# Patient Record
Sex: Female | Born: 1940 | Race: White | Hispanic: No | Smoking: Never smoker
Health system: Southern US, Community
[De-identification: ages and names within clinical notes are randomized; demographics above are authoritative.]

## PROBLEM LIST (undated history)

## (undated) DIAGNOSIS — K2100 Gastro-esophageal reflux disease with esophagitis, without bleeding: Secondary | ICD-10-CM

## (undated) DIAGNOSIS — R0600 Dyspnea, unspecified: Secondary | ICD-10-CM

## (undated) DIAGNOSIS — I499 Cardiac arrhythmia, unspecified: Secondary | ICD-10-CM

## (undated) DIAGNOSIS — I340 Nonrheumatic mitral (valve) insufficiency: Secondary | ICD-10-CM

## (undated) DIAGNOSIS — M436 Torticollis: Secondary | ICD-10-CM

## (undated) DIAGNOSIS — J45909 Unspecified asthma, uncomplicated: Secondary | ICD-10-CM

## (undated) DIAGNOSIS — E785 Hyperlipidemia, unspecified: Secondary | ICD-10-CM

## (undated) DIAGNOSIS — I471 Supraventricular tachycardia, unspecified: Secondary | ICD-10-CM

## (undated) DIAGNOSIS — T4145XA Adverse effect of unspecified anesthetic, initial encounter: Secondary | ICD-10-CM

## (undated) DIAGNOSIS — H3530 Unspecified macular degeneration: Secondary | ICD-10-CM

## (undated) DIAGNOSIS — T8859XA Other complications of anesthesia, initial encounter: Secondary | ICD-10-CM

## (undated) DIAGNOSIS — K2270 Barrett's esophagus without dysplasia: Secondary | ICD-10-CM

## (undated) DIAGNOSIS — N6019 Diffuse cystic mastopathy of unspecified breast: Secondary | ICD-10-CM

## (undated) DIAGNOSIS — Z9109 Other allergy status, other than to drugs and biological substances: Secondary | ICD-10-CM

## (undated) DIAGNOSIS — H409 Unspecified glaucoma: Secondary | ICD-10-CM

## (undated) DIAGNOSIS — I2510 Atherosclerotic heart disease of native coronary artery without angina pectoris: Secondary | ICD-10-CM

## (undated) DIAGNOSIS — J329 Chronic sinusitis, unspecified: Secondary | ICD-10-CM

## (undated) DIAGNOSIS — R112 Nausea with vomiting, unspecified: Secondary | ICD-10-CM

## (undated) DIAGNOSIS — G5790 Unspecified mononeuropathy of unspecified lower limb: Secondary | ICD-10-CM

## (undated) DIAGNOSIS — Z9889 Other specified postprocedural states: Secondary | ICD-10-CM

## (undated) DIAGNOSIS — K5790 Diverticulosis of intestine, part unspecified, without perforation or abscess without bleeding: Secondary | ICD-10-CM

## (undated) DIAGNOSIS — K219 Gastro-esophageal reflux disease without esophagitis: Secondary | ICD-10-CM

## (undated) DIAGNOSIS — J302 Other seasonal allergic rhinitis: Secondary | ICD-10-CM

## (undated) DIAGNOSIS — M1990 Unspecified osteoarthritis, unspecified site: Secondary | ICD-10-CM

## (undated) DIAGNOSIS — I10 Essential (primary) hypertension: Secondary | ICD-10-CM

## (undated) HISTORY — PX: KNEE ARTHROSCOPY: SUR90

## (undated) HISTORY — PX: ABDOMINAL HYSTERECTOMY: SHX81

## (undated) HISTORY — PX: OTHER SURGICAL HISTORY: SHX169

---

## 1978-10-31 HISTORY — PX: BREAST EXCISIONAL BIOPSY: SUR124

## 1978-10-31 HISTORY — PX: BREAST BIOPSY: SHX20

## 2005-02-04 ENCOUNTER — Ambulatory Visit: Payer: Self-pay

## 2005-08-12 ENCOUNTER — Ambulatory Visit: Payer: Self-pay | Admitting: Internal Medicine

## 2006-05-23 ENCOUNTER — Ambulatory Visit: Payer: Self-pay

## 2006-08-15 ENCOUNTER — Ambulatory Visit: Payer: Self-pay | Admitting: Internal Medicine

## 2007-08-17 ENCOUNTER — Ambulatory Visit: Payer: Self-pay | Admitting: Internal Medicine

## 2007-09-07 ENCOUNTER — Other Ambulatory Visit: Payer: Self-pay

## 2007-09-07 ENCOUNTER — Ambulatory Visit: Payer: Self-pay | Admitting: Unknown Physician Specialty

## 2007-09-12 ENCOUNTER — Inpatient Hospital Stay: Payer: Self-pay | Admitting: Unknown Physician Specialty

## 2007-10-08 ENCOUNTER — Ambulatory Visit: Payer: Self-pay | Admitting: Cardiology

## 2007-11-01 HISTORY — PX: JOINT REPLACEMENT: SHX530

## 2008-08-18 ENCOUNTER — Ambulatory Visit: Payer: Self-pay | Admitting: Internal Medicine

## 2009-08-21 ENCOUNTER — Ambulatory Visit: Payer: Self-pay | Admitting: Internal Medicine

## 2010-08-25 ENCOUNTER — Ambulatory Visit: Payer: Self-pay | Admitting: Internal Medicine

## 2011-09-20 ENCOUNTER — Ambulatory Visit: Payer: Self-pay | Admitting: Internal Medicine

## 2012-09-14 ENCOUNTER — Encounter: Payer: Self-pay | Admitting: Rheumatology

## 2012-09-20 ENCOUNTER — Ambulatory Visit: Payer: Self-pay | Admitting: Internal Medicine

## 2013-08-23 ENCOUNTER — Ambulatory Visit: Payer: Self-pay | Admitting: Internal Medicine

## 2013-09-23 ENCOUNTER — Ambulatory Visit: Payer: Self-pay | Admitting: Internal Medicine

## 2014-01-28 ENCOUNTER — Ambulatory Visit: Payer: Self-pay | Admitting: Internal Medicine

## 2014-09-24 ENCOUNTER — Ambulatory Visit: Payer: Self-pay | Admitting: Internal Medicine

## 2014-10-31 ENCOUNTER — Emergency Department: Payer: Self-pay | Admitting: Emergency Medicine

## 2014-10-31 DIAGNOSIS — K639 Disease of intestine, unspecified: Secondary | ICD-10-CM | POA: Diagnosis not present

## 2014-10-31 DIAGNOSIS — Z880 Allergy status to penicillin: Secondary | ICD-10-CM | POA: Diagnosis not present

## 2014-10-31 DIAGNOSIS — R197 Diarrhea, unspecified: Secondary | ICD-10-CM | POA: Diagnosis not present

## 2014-10-31 DIAGNOSIS — R9431 Abnormal electrocardiogram [ECG] [EKG]: Secondary | ICD-10-CM | POA: Diagnosis not present

## 2014-10-31 DIAGNOSIS — R112 Nausea with vomiting, unspecified: Secondary | ICD-10-CM | POA: Diagnosis not present

## 2014-10-31 DIAGNOSIS — K529 Noninfective gastroenteritis and colitis, unspecified: Secondary | ICD-10-CM | POA: Diagnosis not present

## 2014-10-31 LAB — URINALYSIS, COMPLETE
Bilirubin,UR: NEGATIVE
GLUCOSE, UR: NEGATIVE mg/dL (ref 0–75)
Ketone: NEGATIVE
NITRITE: NEGATIVE
Ph: 5 (ref 4.5–8.0)
Protein: 30
Specific Gravity: 1.041 (ref 1.003–1.030)
Squamous Epithelial: 1
WBC UR: 20 /HPF (ref 0–5)

## 2014-10-31 LAB — CBC WITH DIFFERENTIAL/PLATELET
Basophil #: 0.1 10*3/uL (ref 0.0–0.1)
Basophil %: 0.4 %
EOS ABS: 0.1 10*3/uL (ref 0.0–0.7)
EOS PCT: 0.3 %
HCT: 44.3 % (ref 35.0–47.0)
HGB: 14.3 g/dL (ref 12.0–16.0)
Lymphocyte #: 0.5 10*3/uL — ABNORMAL LOW (ref 1.0–3.6)
Lymphocyte %: 3 %
MCH: 29.7 pg (ref 26.0–34.0)
MCHC: 32.4 g/dL (ref 32.0–36.0)
MCV: 92 fL (ref 80–100)
MONO ABS: 1.5 x10 3/mm — AB (ref 0.2–0.9)
Monocyte %: 7.9 %
Neutrophil #: 16.3 10*3/uL — ABNORMAL HIGH (ref 1.4–6.5)
Neutrophil %: 88.4 %
PLATELETS: 356 10*3/uL (ref 150–440)
RBC: 4.83 10*6/uL (ref 3.80–5.20)
RDW: 13.6 % (ref 11.5–14.5)
WBC: 18.5 10*3/uL — ABNORMAL HIGH (ref 3.6–11.0)

## 2014-10-31 LAB — COMPREHENSIVE METABOLIC PANEL
ALBUMIN: 4 g/dL (ref 3.4–5.0)
ALK PHOS: 374 U/L — AB
ALT: 45 U/L
Anion Gap: 9 (ref 7–16)
BUN: 26 mg/dL — ABNORMAL HIGH (ref 7–18)
Bilirubin,Total: 0.5 mg/dL (ref 0.2–1.0)
CREATININE: 1.16 mg/dL (ref 0.60–1.30)
Calcium, Total: 8.7 mg/dL (ref 8.5–10.1)
Chloride: 103 mmol/L (ref 98–107)
Co2: 24 mmol/L (ref 21–32)
EGFR (African American): 59 — ABNORMAL LOW
EGFR (Non-African Amer.): 49 — ABNORMAL LOW
GLUCOSE: 189 mg/dL — AB (ref 65–99)
OSMOLALITY: 282 (ref 275–301)
Potassium: 4.2 mmol/L (ref 3.5–5.1)
SGOT(AST): 43 U/L — ABNORMAL HIGH (ref 15–37)
Sodium: 136 mmol/L (ref 136–145)
Total Protein: 7.2 g/dL (ref 6.4–8.2)

## 2014-10-31 LAB — HEMOGLOBIN: HGB: 13.4 g/dL (ref 12.0–16.0)

## 2014-10-31 LAB — TROPONIN I: Troponin-I: <0.02 ng/mL

## 2014-10-31 LAB — LIPASE, BLOOD: Lipase: 122 U/L (ref 73–393)

## 2014-11-12 DIAGNOSIS — R531 Weakness: Secondary | ICD-10-CM | POA: Diagnosis not present

## 2014-11-12 DIAGNOSIS — K529 Noninfective gastroenteritis and colitis, unspecified: Secondary | ICD-10-CM | POA: Diagnosis not present

## 2014-11-12 DIAGNOSIS — J40 Bronchitis, not specified as acute or chronic: Secondary | ICD-10-CM | POA: Diagnosis not present

## 2014-11-27 DIAGNOSIS — H6121 Impacted cerumen, right ear: Secondary | ICD-10-CM | POA: Diagnosis not present

## 2015-01-30 DIAGNOSIS — R6884 Jaw pain: Secondary | ICD-10-CM | POA: Diagnosis not present

## 2015-01-30 DIAGNOSIS — M542 Cervicalgia: Secondary | ICD-10-CM | POA: Diagnosis not present

## 2015-02-02 DIAGNOSIS — M47812 Spondylosis without myelopathy or radiculopathy, cervical region: Secondary | ICD-10-CM | POA: Diagnosis not present

## 2015-02-02 DIAGNOSIS — M5032 Other cervical disc degeneration, mid-cervical region: Secondary | ICD-10-CM | POA: Diagnosis not present

## 2015-02-13 DIAGNOSIS — I10 Essential (primary) hypertension: Secondary | ICD-10-CM | POA: Diagnosis not present

## 2015-02-13 DIAGNOSIS — E785 Hyperlipidemia, unspecified: Secondary | ICD-10-CM | POA: Diagnosis not present

## 2015-02-13 DIAGNOSIS — R5383 Other fatigue: Secondary | ICD-10-CM | POA: Diagnosis not present

## 2015-02-13 DIAGNOSIS — Z79899 Other long term (current) drug therapy: Secondary | ICD-10-CM | POA: Diagnosis not present

## 2015-02-20 DIAGNOSIS — I10 Essential (primary) hypertension: Secondary | ICD-10-CM | POA: Diagnosis not present

## 2015-02-20 DIAGNOSIS — M1990 Unspecified osteoarthritis, unspecified site: Secondary | ICD-10-CM | POA: Diagnosis not present

## 2015-02-20 DIAGNOSIS — M436 Torticollis: Secondary | ICD-10-CM | POA: Diagnosis not present

## 2015-02-20 DIAGNOSIS — I471 Supraventricular tachycardia: Secondary | ICD-10-CM | POA: Diagnosis not present

## 2015-05-20 ENCOUNTER — Other Ambulatory Visit: Payer: Self-pay | Admitting: Gastroenterology

## 2015-05-20 DIAGNOSIS — R12 Heartburn: Secondary | ICD-10-CM | POA: Diagnosis not present

## 2015-05-20 DIAGNOSIS — R1310 Dysphagia, unspecified: Secondary | ICD-10-CM

## 2015-05-20 DIAGNOSIS — Z1211 Encounter for screening for malignant neoplasm of colon: Secondary | ICD-10-CM | POA: Diagnosis not present

## 2015-05-26 ENCOUNTER — Ambulatory Visit
Admission: RE | Admit: 2015-05-26 | Discharge: 2015-05-26 | Disposition: A | Discharge status: Home / Self Care | Payer: Commercial Managed Care - HMO | Source: Ambulatory Visit | Attending: Gastroenterology | Admitting: Gastroenterology

## 2015-05-26 DIAGNOSIS — K219 Gastro-esophageal reflux disease without esophagitis: Secondary | ICD-10-CM | POA: Diagnosis not present

## 2015-05-26 DIAGNOSIS — R1310 Dysphagia, unspecified: Secondary | ICD-10-CM | POA: Diagnosis present

## 2015-05-26 DIAGNOSIS — I700 Atherosclerosis of aorta: Secondary | ICD-10-CM | POA: Diagnosis not present

## 2015-05-26 DIAGNOSIS — K210 Gastro-esophageal reflux disease with esophagitis: Secondary | ICD-10-CM | POA: Diagnosis not present

## 2015-05-26 DIAGNOSIS — K228 Other specified diseases of esophagus: Secondary | ICD-10-CM | POA: Insufficient documentation

## 2015-05-26 DIAGNOSIS — R12 Heartburn: Secondary | ICD-10-CM

## 2015-06-01 DIAGNOSIS — H2513 Age-related nuclear cataract, bilateral: Secondary | ICD-10-CM | POA: Diagnosis not present

## 2015-06-01 DIAGNOSIS — D3131 Benign neoplasm of right choroid: Secondary | ICD-10-CM | POA: Diagnosis not present

## 2015-06-23 ENCOUNTER — Encounter
Admission: RE | Disposition: A | Discharge status: Home / Self Care | Payer: Self-pay | Source: Ambulatory Visit | Attending: Gastroenterology

## 2015-06-23 ENCOUNTER — Ambulatory Visit
Admission: RE | Admit: 2015-06-23 | Discharge: 2015-06-23 | Disposition: A | Discharge status: Home / Self Care | Payer: Commercial Managed Care - HMO | Source: Ambulatory Visit | Attending: Gastroenterology | Admitting: Gastroenterology

## 2015-06-23 ENCOUNTER — Encounter: Payer: Self-pay | Admitting: *Deleted

## 2015-06-23 ENCOUNTER — Ambulatory Visit: Payer: Commercial Managed Care - HMO | Admitting: Anesthesiology

## 2015-06-23 DIAGNOSIS — K2210 Ulcer of esophagus without bleeding: Secondary | ICD-10-CM | POA: Diagnosis not present

## 2015-06-23 DIAGNOSIS — K219 Gastro-esophageal reflux disease without esophagitis: Secondary | ICD-10-CM | POA: Diagnosis not present

## 2015-06-23 DIAGNOSIS — I10 Essential (primary) hypertension: Secondary | ICD-10-CM | POA: Diagnosis not present

## 2015-06-23 DIAGNOSIS — Z882 Allergy status to sulfonamides status: Secondary | ICD-10-CM | POA: Insufficient documentation

## 2015-06-23 DIAGNOSIS — Z1211 Encounter for screening for malignant neoplasm of colon: Secondary | ICD-10-CM | POA: Diagnosis not present

## 2015-06-23 DIAGNOSIS — J45909 Unspecified asthma, uncomplicated: Secondary | ICD-10-CM | POA: Insufficient documentation

## 2015-06-23 DIAGNOSIS — Z7982 Long term (current) use of aspirin: Secondary | ICD-10-CM | POA: Diagnosis not present

## 2015-06-23 DIAGNOSIS — K2970 Gastritis, unspecified, without bleeding: Secondary | ICD-10-CM | POA: Diagnosis not present

## 2015-06-23 DIAGNOSIS — K224 Dyskinesia of esophagus: Secondary | ICD-10-CM | POA: Diagnosis not present

## 2015-06-23 DIAGNOSIS — I2510 Atherosclerotic heart disease of native coronary artery without angina pectoris: Secondary | ICD-10-CM | POA: Diagnosis not present

## 2015-06-23 DIAGNOSIS — B9681 Helicobacter pylori [H. pylori] as the cause of diseases classified elsewhere: Secondary | ICD-10-CM | POA: Diagnosis not present

## 2015-06-23 DIAGNOSIS — R1310 Dysphagia, unspecified: Secondary | ICD-10-CM | POA: Insufficient documentation

## 2015-06-23 DIAGNOSIS — Z880 Allergy status to penicillin: Secondary | ICD-10-CM | POA: Insufficient documentation

## 2015-06-23 DIAGNOSIS — M1990 Unspecified osteoarthritis, unspecified site: Secondary | ICD-10-CM | POA: Insufficient documentation

## 2015-06-23 DIAGNOSIS — Z538 Procedure and treatment not carried out for other reasons: Secondary | ICD-10-CM | POA: Diagnosis not present

## 2015-06-23 DIAGNOSIS — E785 Hyperlipidemia, unspecified: Secondary | ICD-10-CM | POA: Insufficient documentation

## 2015-06-23 DIAGNOSIS — Z79899 Other long term (current) drug therapy: Secondary | ICD-10-CM | POA: Insufficient documentation

## 2015-06-23 DIAGNOSIS — Z791 Long term (current) use of non-steroidal anti-inflammatories (NSAID): Secondary | ICD-10-CM | POA: Insufficient documentation

## 2015-06-23 DIAGNOSIS — K3189 Other diseases of stomach and duodenum: Secondary | ICD-10-CM | POA: Diagnosis not present

## 2015-06-23 DIAGNOSIS — K5730 Diverticulosis of large intestine without perforation or abscess without bleeding: Secondary | ICD-10-CM | POA: Insufficient documentation

## 2015-06-23 DIAGNOSIS — T7840XA Allergy, unspecified, initial encounter: Secondary | ICD-10-CM | POA: Insufficient documentation

## 2015-06-23 DIAGNOSIS — K210 Gastro-esophageal reflux disease with esophagitis: Secondary | ICD-10-CM | POA: Diagnosis not present

## 2015-06-23 DIAGNOSIS — K259 Gastric ulcer, unspecified as acute or chronic, without hemorrhage or perforation: Secondary | ICD-10-CM | POA: Diagnosis not present

## 2015-06-23 DIAGNOSIS — I472 Ventricular tachycardia: Secondary | ICD-10-CM | POA: Diagnosis not present

## 2015-06-23 DIAGNOSIS — K2950 Unspecified chronic gastritis without bleeding: Secondary | ICD-10-CM | POA: Insufficient documentation

## 2015-06-23 DIAGNOSIS — K2960 Other gastritis without bleeding: Secondary | ICD-10-CM | POA: Diagnosis not present

## 2015-06-23 HISTORY — PX: ESOPHAGOGASTRODUODENOSCOPY (EGD) WITH PROPOFOL: SHX5813

## 2015-06-23 HISTORY — DX: Torticollis: M43.6

## 2015-06-23 HISTORY — DX: Unspecified asthma, uncomplicated: J45.909

## 2015-06-23 HISTORY — DX: Diffuse cystic mastopathy of unspecified breast: N60.19

## 2015-06-23 HISTORY — PX: COLONOSCOPY WITH PROPOFOL: SHX5780

## 2015-06-23 HISTORY — DX: Atherosclerotic heart disease of native coronary artery without angina pectoris: I25.10

## 2015-06-23 HISTORY — DX: Essential (primary) hypertension: I10

## 2015-06-23 HISTORY — DX: Supraventricular tachycardia, unspecified: I47.10

## 2015-06-23 HISTORY — DX: Hyperlipidemia, unspecified: E78.5

## 2015-06-23 HISTORY — DX: Supraventricular tachycardia: I47.1

## 2015-06-23 HISTORY — DX: Chronic sinusitis, unspecified: J32.9

## 2015-06-23 HISTORY — DX: Unspecified osteoarthritis, unspecified site: M19.90

## 2015-06-23 HISTORY — DX: Other allergy status, other than to drugs and biological substances: Z91.09

## 2015-06-23 SURGERY — ESOPHAGOGASTRODUODENOSCOPY (EGD) WITH PROPOFOL
Anesthesia: General

## 2015-06-23 MED ORDER — SODIUM CHLORIDE 0.9 % IV SOLN
INTRAVENOUS | Status: DC
  Administered 2015-06-23: 16:00:00 via INTRAVENOUS

## 2015-06-23 MED ORDER — SODIUM CHLORIDE 0.9 % IV SOLN
INTRAVENOUS | Status: DC
  Administered 2015-06-23: 1000 mL via INTRAVENOUS

## 2015-06-23 MED ORDER — PROPOFOL INFUSION 10 MG/ML OPTIME
INTRAVENOUS | Status: DC | PRN
  Administered 2015-06-23: 120 ug/kg/min via INTRAVENOUS

## 2015-06-23 MED ORDER — GLYCOPYRROLATE 0.2 MG/ML IJ SOLN
INTRAMUSCULAR | Status: DC | PRN
  Administered 2015-06-23: 0.2 mg via INTRAVENOUS

## 2015-06-23 MED ORDER — MIDAZOLAM HCL 2 MG/2ML IJ SOLN
INTRAMUSCULAR | Status: DC | PRN
  Administered 2015-06-23: 1 mg via INTRAVENOUS

## 2015-06-23 MED ORDER — FENTANYL CITRATE (PF) 100 MCG/2ML IJ SOLN
INTRAMUSCULAR | Status: DC | PRN
  Administered 2015-06-23: 50 ug via INTRAVENOUS

## 2015-06-23 MED ORDER — SODIUM CHLORIDE 0.9 % IV SOLN: INTRAVENOUS | Status: DC

## 2015-06-23 NOTE — Anesthesia Preprocedure Evaluation (Signed)
Anesthesia Evaluation  Patient identified by MRN, date of birth, ID band Patient awake    Reviewed: Allergy & Precautions, H&P , NPO status , Patient's Chart, lab work & pertinent test results, reviewed documented beta blocker date and time   History of Anesthesia Complications (+) PONV, PROLONGED EMERGENCE and history of anesthetic complications  Airway Mallampati: II  TM Distance: >3 FB Neck ROM: full  Mouth opening: Limited Mouth Opening  Dental no notable dental hx. (+) Teeth Intact   Pulmonary neg shortness of breath, asthma , neg sleep apnea, neg COPDneg recent URI,  breath sounds clear to auscultation  Pulmonary exam normal       Cardiovascular Exercise Tolerance: Good hypertension, - angina+ CAD - Past MI, - Cardiac Stents and - CABG Normal cardiovascular exam+ dysrhythmias Supra Ventricular Tachycardia - Valvular Problems/MurmursRhythm:regular Rate:Normal     Neuro/Psych  Neuromuscular disease (Torticulis) negative psych ROS   GI/Hepatic Neg liver ROS, GERD-  ,  Endo/Other  negative endocrine ROS  Renal/GU negative Renal ROS  negative genitourinary   Musculoskeletal   Abdominal   Peds  Hematology negative hematology ROS (+)   Anesthesia Other Findings Past Medical History:   Coronary artery disease                                      Hypertension                                                 Arthritis                                                    Hyperlipemia                                                 Reactive airway disease                                      Environmental allergies                                      Recurrent sinusitis                                          Fibrocystic breast disease                                   Torticollis  PSVT (paroxysmal supraventricular tachycardia)                Reproductive/Obstetrics negative OB ROS                             Anesthesia Physical Anesthesia Plan  ASA: II  Anesthesia Plan: General   Post-op Pain Management:    Induction:   Airway Management Planned:   Additional Equipment:   Intra-op Plan:   Post-operative Plan:   Informed Consent: I have reviewed the patients History and Physical, chart, labs and discussed the procedure including the risks, benefits and alternatives for the proposed anesthesia with the patient or authorized representative who has indicated his/her understanding and acceptance.   Dental Advisory Given  Plan Discussed with: Anesthesiologist, CRNA and Surgeon  Anesthesia Plan Comments:         Anesthesia Quick Evaluation

## 2015-06-23 NOTE — Transfer of Care (Signed)
Immediate Anesthesia Transfer of Care Note  Patient: Mary Thornton  Procedure(s) Performed: Procedure(s): ESOPHAGOGASTRODUODENOSCOPY (EGD) WITH PROPOFOL (N/A) COLONOSCOPY WITH PROPOFOL (N/A)  Patient Location: PACU and Endoscopy Unit  Anesthesia Type:General  Level of Consciousness: sedated and patient cooperative  Airway & Oxygen Therapy: Patient Spontanous Breathing and Patient connected to nasal cannula oxygen  Post-op Assessment: Report given to RN and Post -op Vital signs reviewed and stable  Post vital signs: Reviewed and stable  Last Vitals:  Filed Vitals:   06/23/15 1738  BP: 120/65  Pulse: 55  Temp: 36.4 C  Resp: 16    Complications: No apparent anesthesia complications

## 2015-06-23 NOTE — H&P (Signed)
Outpatient short stay form Pre-procedure 06/23/2015 4:03 PM Mary Sails MD  Primary Physician: Dr. Fulton Reek  Reason for visit:  EGD and colonoscopy  History of present illness:  Patient is a 74 year old female presenting for EGD and colonoscopy in regards to symptomatic dysphagia and colon screening. She does not regurgitate foods however sometimes choke on different things mostly dry material such as breads. He has in the past had surgery for torticollis many years ago.Marland Kitchen He is not currently taking a acid blocking agent.    Current facility-administered medications:  .  0.9 %  sodium chloride infusion, , Intravenous, Continuous, Mary Sails, MD .  0.9 %  sodium chloride infusion, , Intravenous, Continuous, Mary Sails, MD, Last Rate: 20 mL/hr at 06/23/15 1507, 1,000 mL at 06/23/15 1507 .  0.9 %  sodium chloride infusion, , Intravenous, Continuous, Mary Sails, MD  Prescriptions prior to admission  Medication Sig Dispense Refill Last Dose  . acetaminophen (TYLENOL) 325 MG tablet Take 650 mg by mouth daily.     Marland Kitchen aspirin 81 MG EC tablet Take 81 mg by mouth daily. Swallow whole.   Past Week at Unknown time  . betamethasone dipropionate (DIPROLENE) 0.05 % cream Apply topically 2 (two) times daily.   06/22/2015 at Unknown time  . calcium carbonate (OS-CAL) 600 MG TABS tablet Take 600 mg by mouth 2 (two) times daily with a meal.   06/22/2015 at Unknown time  . cyclobenzaprine (FLEXERIL) 5 MG tablet Take 5 mg by mouth 3 (three) times daily as needed for muscle spasms.   06/22/2015 at Unknown time  . diclofenac (VOLTAREN) 75 MG EC tablet Take 75 mg by mouth 2 (two) times daily.   06/22/2015 at Unknown time  . loratadine (CLARITIN) 10 MG tablet Take 10 mg by mouth daily.   06/22/2015 at Unknown time  . montelukast (SINGULAIR) 10 MG tablet Take 10 mg by mouth at bedtime.   06/22/2015 at Unknown time  . Multiple Vitamin (MULTIVITAMIN) capsule Take 1 capsule by mouth daily.    Past Week at Unknown time  . ramipril (ALTACE) 5 MG capsule Take 5 mg by mouth daily.   06/23/2015 at 0700     Allergies  Allergen Reactions  . Penicillins   . Sulfa Antibiotics      Past Medical History  Diagnosis Date  . Coronary artery disease   . Hypertension   . Arthritis   . Hyperlipemia   . Reactive airway disease   . Environmental allergies   . Recurrent sinusitis   . Fibrocystic breast disease   . Torticollis   . PSVT (paroxysmal supraventricular tachycardia)     Review of systems:      Physical Exam    Heart and lungs: Regular rate and rhythm without rub or gallop, lungs are bilaterally clear    HEENT: Normocephalic atraumatic eyes are anicteric    Other:     Pertinant exam for procedure: Nontender nondistended bowel sounds positive and normoactive    Planned proceedures: EGD and colonoscopy with indicated procedures. Possible esophageal dilatation. I have discussed the risks benefits and complications of procedures to include not limited to bleeding, infection, perforation and the risk of sedation and the patient wishes to proceed.    Mary Sails, MD Gastroenterology 06/23/2015  4:03 PM

## 2015-06-24 ENCOUNTER — Encounter: Payer: Self-pay | Admitting: Gastroenterology

## 2015-06-25 LAB — SURGICAL PATHOLOGY

## 2015-06-25 NOTE — Anesthesia Postprocedure Evaluation (Signed)
  Anesthesia Post-op Note  Patient: Mary Thornton  Procedure(s) Performed: Procedure(s): ESOPHAGOGASTRODUODENOSCOPY (EGD) WITH PROPOFOL (N/A) COLONOSCOPY WITH PROPOFOL (N/A)  Anesthesia type:General  Patient location: PACU  Post pain: Pain level controlled  Post assessment: Post-op Vital signs reviewed, Patient's Cardiovascular Status Stable, Respiratory Function Stable, Patent Airway and No signs of Nausea or vomiting  Post vital signs: Reviewed and stable  Last Vitals:  Filed Vitals:   06/23/15 1810  BP: 148/79  Pulse: 60  Temp:   Resp: 17    Level of consciousness: awake, alert  and patient cooperative  Complications: No apparent anesthesia complications

## 2015-06-25 NOTE — Op Note (Signed)
[facility] Gastroenterology Patient Name: Mary Thornton Procedure Date: 06/23/2015 4:01 PM MRN: [id number] Account #: [id number] Date of Birth: 1941-07-03 Admit Type: Outpatient Age: 74 Room: [facility] ENDO ROOM 3 Gender: Female Note Status: Finalized Procedure:         Colonoscopy Indications:       Screening for colorectal malignant neoplasm Providers:         [name], MD Referring MD:      Leonie Douglas. Doy Hutching, MD (Referring MD) Medicines:         Monitored Anesthesia Care Complications:     No immediate complications. Procedure:         Pre-Anesthesia Assessment:                    - ASA Grade Assessment: III - A patient with severe                     systemic disease.                    After obtaining informed consent, the colonoscope was                     passed under direct vision. Throughout the procedure, the                     patient's blood pressure, pulse, and oxygen saturations                     were monitored continuously. The Olympus PCF-H180AL                     colonoscope ( S#: [id number] ) was introduced through the                     anus with the intention of advancing to the cecum. The                     scope was advanced to the hepatic flexure before the                     procedure was aborted. Medications were given. The                     colonoscopy was unusually difficult due to restricted                     mobility of the colon, significant looping and a tortuous                     colon. The patient tolerated the procedure well. The                     quality of the bowel preparation was good. Findings:      A few small-mouthed diverticula were found in the sigmoid colon, in the       descending colon and in the transverse colon. I was unable to advance       beyond the hepatic flexure despite position changes, abdominal support       and change of scope.      The retroflexed view of the distal rectum and anal  verge was normal and       showed no anal or rectal abnormalities.      The digital  rectal exam was normal. Impression:        - Diverticulosis in the sigmoid colon, in the descending                     colon and in the transverse colon.                    - The distal rectum and anal verge are normal on                     retroflexion view.                    - No specimens collected. Recommendation:    - Perform an air contrast barium enema at appointment to                     be scheduled. Procedure Code(s): --- Professional ---                    [phone], 28, Colonoscopy, flexible; diagnostic, including                     collection of specimen(s) by brushing or washing, when                     performed (separate procedure) Diagnosis Code(s): --- Professional ---                    V76.51, Special screening for malignant neoplasms of colon                    562.10, Diverticulosis of colon (without mention of                     hemorrhage) CPT copyright 2014 American Medical Association. All rights reserved. The codes documented in this report are preliminary and upon coder review may  be revised to meet current compliance requirements. [name], MD 06/23/2015 5:40:46 PM This report has been signed electronically. Number of Addenda: 0 Note Initiated On: 06/23/2015 4:01 PM Total Procedure Duration: 0 hours 39 minutes 49 seconds       [facility]

## 2015-06-25 NOTE — Op Note (Signed)
[facility] Gastroenterology Patient Name: Mary Thornton Procedure Date: 06/23/2015 4:05 PM MRN: [id number] Account #: [id number] Date of Birth: 1941-06-28 Admit Type: Outpatient Age: 74 Room: [facility] ENDO ROOM 3 Gender: Female Note Status: Finalized Procedure:         Upper GI endoscopy Indications:       Dysphagia Providers:         [name], MD Referring MD:      Leonie Douglas. Doy Hutching, MD (Referring MD) Medicines:         Monitored Anesthesia Care Complications:     No immediate complications. Procedure:         Pre-Anesthesia Assessment:                    - ASA Grade Assessment: III - A patient with severe                     systemic disease.                    After obtaining informed consent, the endoscope was passed                     under direct vision. Throughout the procedure, the                     patient's blood pressure, pulse, and oxygen saturations                     were monitored continuously. The Olympus GIF-160 endoscope                     (S#. [id number]) was introduced through the mouth, and                     advanced to the third part of duodenum. The upper GI                     endoscopy was accomplished without difficulty. The patient                     tolerated the procedure well. Findings:      The Z-line was irregular. Biopsies were taken with a cold forceps for       histology.      Abnormal motility was noted in the middle third of the esophagus and in       the lower third of the esophagus. The cricopharyngeus was normal. There       is spasticity of the esophageal body. The distal esophagus/lower       esophageal sphincter is spastic, but gives up passage to the endoscope.       Tertiary peristaltic waves are noted. At times there is a feline       appearance to the distal esophagus, and furrowing in the upper       esophagus. Biuopsies were also taken in the esophagus at about 28 cm       from the incisors.  Moderate inflammation characterized by congestion (edema), erythema and       friability was found at the incisura and in the gastric antrum. Biopsies       were taken with a cold forceps for Helicobacter pylori testing. Biopsies       were taken with a cold forceps for histology. Biopsies were taken with a  cold forceps for histology.      The cardia and gastric fundus were normal on retroflexion.      The examined duodenum was normal. Impression:        - Z-line irregular. Biopsied.                    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                    - Erosive gastritis. Biopsied.                    - Normal examined duodenum. Recommendation:    - Discharge patient to home.                    - Use Protonix (pantoprazole) 40 mg PO BID for 1 month.                    - Use Protonix (pantoprazole) 40 mg PO daily daily.                    - Return to GI clinic in 1 month.                    - No aspirin, ibuprofen, naproxen, or other non-steroidal                     anti-inflammatory drugs. Procedure Code(s): --- Professional ---                    [phone], Esophagogastroduodenoscopy, flexible, transoral;                     with biopsy, single or multiple CPT copyright 2014 American Medical Association. All rights reserved. The codes documented in this report are preliminary and upon coder review may  be revised to meet current compliance requirements. [name], MD 06/23/2015 4:51:38 PM This report has been signed electronically. Number of Addenda: 0 Note Initiated On: 06/23/2015 4:05 PM      [facility]

## 2015-06-30 ENCOUNTER — Other Ambulatory Visit: Payer: Self-pay | Admitting: Gastroenterology

## 2015-06-30 DIAGNOSIS — K5730 Diverticulosis of large intestine without perforation or abscess without bleeding: Secondary | ICD-10-CM

## 2015-07-08 ENCOUNTER — Encounter: Payer: Self-pay | Admitting: Emergency Medicine

## 2015-07-08 ENCOUNTER — Emergency Department: Payer: Commercial Managed Care - HMO

## 2015-07-08 ENCOUNTER — Ambulatory Visit: Admission: RE | Admit: 2015-07-08 | Payer: Commercial Managed Care - HMO | Source: Ambulatory Visit

## 2015-07-08 ENCOUNTER — Emergency Department
Admission: EM | Admit: 2015-07-08 | Discharge: 2015-07-08 | Disposition: A | Discharge status: Home / Self Care | Payer: Commercial Managed Care - HMO | Attending: Emergency Medicine | Admitting: Emergency Medicine

## 2015-07-08 DIAGNOSIS — Z791 Long term (current) use of non-steroidal anti-inflammatories (NSAID): Secondary | ICD-10-CM | POA: Insufficient documentation

## 2015-07-08 DIAGNOSIS — Y9289 Other specified places as the place of occurrence of the external cause: Secondary | ICD-10-CM | POA: Diagnosis not present

## 2015-07-08 DIAGNOSIS — I10 Essential (primary) hypertension: Secondary | ICD-10-CM | POA: Insufficient documentation

## 2015-07-08 DIAGNOSIS — X58XXXA Exposure to other specified factors, initial encounter: Secondary | ICD-10-CM | POA: Diagnosis not present

## 2015-07-08 DIAGNOSIS — Z880 Allergy status to penicillin: Secondary | ICD-10-CM | POA: Insufficient documentation

## 2015-07-08 DIAGNOSIS — Y9389 Activity, other specified: Secondary | ICD-10-CM | POA: Insufficient documentation

## 2015-07-08 DIAGNOSIS — Z7982 Long term (current) use of aspirin: Secondary | ICD-10-CM | POA: Insufficient documentation

## 2015-07-08 DIAGNOSIS — Z79899 Other long term (current) drug therapy: Secondary | ICD-10-CM | POA: Insufficient documentation

## 2015-07-08 DIAGNOSIS — Y998 Other external cause status: Secondary | ICD-10-CM | POA: Diagnosis not present

## 2015-07-08 DIAGNOSIS — T185XXA Foreign body in anus and rectum, initial encounter: Secondary | ICD-10-CM | POA: Diagnosis not present

## 2015-07-08 DIAGNOSIS — R935 Abnormal findings on diagnostic imaging of other abdominal regions, including retroperitoneum: Secondary | ICD-10-CM | POA: Diagnosis not present

## 2015-07-08 NOTE — ED Notes (Signed)
AAOx3.  Skin warm and dry.  Nad.  D/C home.  Ambulates with easy and steady gait

## 2015-07-08 NOTE — ED Provider Notes (Signed)
Time Seen: Approximately 1125 I have reviewed the triage notes  Chief Complaint: Foreign Body in Rectum   History of Present Illness: Mary Thornton is a 74 y.o. female who presents in the process of performing her prepped for a barium enema that she had the tip of the device fall off and stay in her rectal area. She describes it as a small (approximately an inch and a half) plastic object that has retained in the rectal area. She states she's tried to get it out herself unsuccessfully. Patient states she's confident still located in her rectal area. She denies any rectal bleeding. Past Medical History  Diagnosis Date  . Coronary artery disease   . Hypertension   . Arthritis   . Hyperlipemia   . Reactive airway disease   . Environmental allergies   . Recurrent sinusitis   . Fibrocystic breast disease   . Torticollis   . PSVT (paroxysmal supraventricular tachycardia)     There are no active problems to display for this patient.   Past Surgical History  Procedure Laterality Date  . Knee arthroscopy    . Abdominal hysterectomy    . Torticollis surgery x 2    . Joint replacement    . Esophagogastroduodenoscopy (egd) with propofol N/A 06/23/2015    Procedure: ESOPHAGOGASTRODUODENOSCOPY (EGD) WITH PROPOFOL;  Surgeon: [name], MD;  Location[facility] ENDOSCOPY;  Service: Endoscopy;  Laterality: N/A;  . Colonoscopy with propofol N/A 06/23/2015    Procedure: COLONOSCOPY WITH PROPOFOL;  Surgeon: [name], MD;  Location: [facility] ENDOSCOPY;  Service: Endoscopy;  Laterality: N/A;    Past Surgical History  Procedure Laterality Date  . Knee arthroscopy    . Abdominal hysterectomy    . Torticollis surgery x 2    . Joint replacement    . Esophagogastroduodenoscopy (egd) with propofol N/A 06/23/2015    Procedure: ESOPHAGOGASTRODUODENOSCOPY (EGD) WITH PROPOFOL;  Surgeon: [name], MD;  Location: [facility] ENDOSCOPY;  Service: Endoscopy;  Laterality: N/A;  . Colonoscopy  with propofol N/A 06/23/2015    Procedure: COLONOSCOPY WITH PROPOFOL;  Surgeon: [name], MD;  Location: [facility] ENDOSCOPY;  Service: Endoscopy;  Laterality: N/A;    Current Outpatient Rx  Name  Route  Sig  Dispense  Refill  . acetaminophen (TYLENOL) 325 MG tablet   Oral   Take 650 mg by mouth daily.         Marland Kitchen aspirin 81 MG EC tablet   Oral   Take 81 mg by mouth daily. Swallow whole.         . betamethasone dipropionate (DIPROLENE) 0.05 % cream   Topical   Apply topically 2 (two) times daily.         . calcium carbonate (OS-CAL) 600 MG TABS tablet   Oral   Take 600 mg by mouth 2 (two) times daily with a meal.         . cyclobenzaprine (FLEXERIL) 5 MG tablet   Oral   Take 5 mg by mouth 3 (three) times daily as needed for muscle spasms.         . diclofenac (VOLTAREN) 75 MG EC tablet   Oral   Take 75 mg by mouth 2 (two) times daily.         Marland Kitchen loratadine (CLARITIN) 10 MG tablet   Oral   Take 10 mg by mouth daily.         . montelukast (SINGULAIR) 10 MG tablet   Oral   Take  10 mg by mouth at bedtime.         . Multiple Vitamin (MULTIVITAMIN) capsule   Oral   Take 1 capsule by mouth daily.         . ramipril (ALTACE) 5 MG capsule   Oral   Take 5 mg by mouth daily.           Allergies:  Penicillins and Sulfa antibiotics  Family History: No family history on file.  Social History: Social History  Substance Use Topics  . Smoking status: Never Smoker   . Smokeless tobacco: None  . Alcohol Use: No     Review of Systems:  Patient denies any abdominal pain She denies any fever She denies any urinary complaints  Physical Exam:  ED Triage Vitals  Enc Vitals Group     BP 07/08/15 0936 142/92 mmHg     Pulse Rate 07/08/15 0936 101     Resp 07/08/15 0936 18     Temp 07/08/15 0939 97.9 F (36.6 C)     Temp Source 07/08/15 0939 Oral     SpO2 07/08/15 0936 100 %     Weight --      Height --      Head Cir --      Peak Flow --       Pain Score 07/08/15 1201 0     Pain Loc --      Pain Edu? --      Excl. in [place]? --     General: Awake , Alert , and Oriented times 3; GCS 15 Head: Normal cephalic , atraumatic Eyes: Pupils equal , round, reactive to light Nose/Throat: No nasal drainage, patent upper airway without erythema or exudate.  Neck: Supple, Full range of motion, No anterior adenopathy or palpable thyroid masses Lungs: Clear to ascultation without wheezes , rhonchi, or rales Heart: Regular rate, regular rhythm without murmurs , gallops , or rubs Abdomen: Soft, non tender without rebound, guarding , or rigidity; bowel sounds positive and symmetric in all 4 quadrants. No organomegaly .        Extremities: 2 plus symmetric pulses. No edema, clubbing or cyanosis Neurologic: normal ambulation, Motor symmetric without deficits, sensory intact Skin: warm, dry, no rashes    Radiology:     EXAM: ABDOMEN - 1 VIEW  COMPARISON: Abdominal pelvic CT scan of October 31, 2014  FINDINGS: Moderately increased stool and gas is noted within the colon without evidence of obstructive pattern. On the lateral film in the presacral soft tissues there is a structure which could reflect a radiolucent enema tip containing air but this appearance is not diagnostic of a true foreign body.  There degenerative changes of the lumbar spine and both hips. There is calcification within the wall of the abdominal aorta.  IMPRESSION: No radiopaque catheter cap is visible. If there is strong clinical likelihood that the cap is retained still and is not visible at anoscopy, a noncontrast abdominal and pelvic CT scan CT scan may be a useful next imaging step.   I personally reviewed the radiologic studies      ED Course:  I reviewed the case with the patient's initial gastroenterologist that I performed a recent colonoscopy a week ago. He was unavailable at this time and would then we spoke to gastroenterology unassigned Dr. Jerene Bears  . He is seen and evaluated the patient and it was felt that outpatient management with conservative observation that she would likely pass the plastic tip on her  own. If it does not pass in the next couple of days then the likelihood Perform outpatient anal scope     Assessment: Rectal foreign body   Final Clinical Impression:  Final diagnoses:  Rectal foreign body, initial encounter     Plan:  Patient was advised to return immediately if condition worsens. Patient was advised to follow up with her primary care physician or other specialized physicians involved and in their current assessment.            Daymon Larsen, MD 07/08/15 [phone]

## 2015-07-08 NOTE — ED Notes (Signed)
Pt was doing prep with a barium enema.  States that the blue tip of the bottle is in her rectum.

## 2015-07-08 NOTE — Progress Notes (Signed)
Reports accidentally placing enema cap into rectum when giving enema.  No rectal pain or unusual sensation currently.  Describes cap as about one inch long.  No evidence of cap on DRE or KUB.  This cap should pass in her stool based on the size.     She was instructed to check her stool and call Dr Marton Redwood office if does not pass in the next 3 - 4 days.

## 2015-07-08 NOTE — ED Notes (Signed)
Ambulated to Xray.  Gait Steady.  NAD.

## 2015-07-21 DIAGNOSIS — A048 Other specified bacterial intestinal infections: Secondary | ICD-10-CM | POA: Diagnosis not present

## 2015-07-21 DIAGNOSIS — K5730 Diverticulosis of large intestine without perforation or abscess without bleeding: Secondary | ICD-10-CM | POA: Diagnosis not present

## 2015-07-21 DIAGNOSIS — K2270 Barrett's esophagus without dysplasia: Secondary | ICD-10-CM | POA: Diagnosis not present

## 2015-07-29 ENCOUNTER — Ambulatory Visit: Payer: Commercial Managed Care - HMO

## 2015-08-06 ENCOUNTER — Ambulatory Visit
Admission: RE | Admit: 2015-08-06 | Discharge: 2015-08-06 | Disposition: A | Discharge status: Home / Self Care | Payer: Commercial Managed Care - HMO | Source: Ambulatory Visit | Attending: Gastroenterology | Admitting: Gastroenterology

## 2015-08-06 DIAGNOSIS — K5730 Diverticulosis of large intestine without perforation or abscess without bleeding: Secondary | ICD-10-CM

## 2015-08-18 DIAGNOSIS — I10 Essential (primary) hypertension: Secondary | ICD-10-CM | POA: Diagnosis not present

## 2015-08-18 DIAGNOSIS — Z79899 Other long term (current) drug therapy: Secondary | ICD-10-CM | POA: Diagnosis not present

## 2015-08-18 DIAGNOSIS — E785 Hyperlipidemia, unspecified: Secondary | ICD-10-CM | POA: Diagnosis not present

## 2015-08-24 ENCOUNTER — Other Ambulatory Visit: Payer: Self-pay | Admitting: Internal Medicine

## 2015-08-24 DIAGNOSIS — Z23 Encounter for immunization: Secondary | ICD-10-CM | POA: Diagnosis not present

## 2015-08-24 DIAGNOSIS — Z1239 Encounter for other screening for malignant neoplasm of breast: Secondary | ICD-10-CM | POA: Diagnosis not present

## 2015-08-24 DIAGNOSIS — E7800 Pure hypercholesterolemia, unspecified: Secondary | ICD-10-CM | POA: Diagnosis not present

## 2015-08-24 DIAGNOSIS — M5136 Other intervertebral disc degeneration, lumbar region: Secondary | ICD-10-CM | POA: Diagnosis not present

## 2015-08-24 DIAGNOSIS — M436 Torticollis: Secondary | ICD-10-CM | POA: Diagnosis not present

## 2015-08-24 DIAGNOSIS — Z13820 Encounter for screening for osteoporosis: Secondary | ICD-10-CM | POA: Diagnosis not present

## 2015-08-24 DIAGNOSIS — N390 Urinary tract infection, site not specified: Secondary | ICD-10-CM | POA: Diagnosis not present

## 2015-08-24 DIAGNOSIS — Z0000 Encounter for general adult medical examination without abnormal findings: Secondary | ICD-10-CM | POA: Diagnosis not present

## 2015-08-24 DIAGNOSIS — I10 Essential (primary) hypertension: Secondary | ICD-10-CM | POA: Diagnosis not present

## 2015-08-24 DIAGNOSIS — Z1231 Encounter for screening mammogram for malignant neoplasm of breast: Secondary | ICD-10-CM

## 2015-08-28 DIAGNOSIS — Z13820 Encounter for screening for osteoporosis: Secondary | ICD-10-CM | POA: Diagnosis not present

## 2015-09-28 ENCOUNTER — Other Ambulatory Visit: Payer: Self-pay | Admitting: Internal Medicine

## 2015-09-28 ENCOUNTER — Ambulatory Visit
Admission: RE | Admit: 2015-09-28 | Discharge: 2015-09-28 | Disposition: A | Discharge status: Home / Self Care | Payer: Commercial Managed Care - HMO | Source: Ambulatory Visit | Attending: Internal Medicine | Admitting: Internal Medicine

## 2015-09-28 DIAGNOSIS — Z1231 Encounter for screening mammogram for malignant neoplasm of breast: Secondary | ICD-10-CM | POA: Diagnosis not present

## 2015-10-20 ENCOUNTER — Other Ambulatory Visit: Payer: Self-pay | Admitting: Gastroenterology

## 2015-10-20 DIAGNOSIS — R1013 Epigastric pain: Secondary | ICD-10-CM

## 2015-10-20 DIAGNOSIS — K2270 Barrett's esophagus without dysplasia: Secondary | ICD-10-CM | POA: Diagnosis not present

## 2015-10-22 ENCOUNTER — Ambulatory Visit: Payer: Commercial Managed Care - HMO

## 2015-10-28 ENCOUNTER — Ambulatory Visit
Admission: RE | Admit: 2015-10-28 | Discharge: 2015-10-28 | Disposition: A | Discharge status: Home / Self Care | Payer: Commercial Managed Care - HMO | Source: Ambulatory Visit | Attending: Gastroenterology | Admitting: Gastroenterology

## 2015-10-28 DIAGNOSIS — R1013 Epigastric pain: Secondary | ICD-10-CM | POA: Insufficient documentation

## 2015-10-28 DIAGNOSIS — R1084 Generalized abdominal pain: Secondary | ICD-10-CM | POA: Diagnosis not present

## 2015-12-17 DIAGNOSIS — M545 Low back pain: Secondary | ICD-10-CM | POA: Diagnosis not present

## 2015-12-17 DIAGNOSIS — M47816 Spondylosis without myelopathy or radiculopathy, lumbar region: Secondary | ICD-10-CM | POA: Diagnosis not present

## 2016-02-15 DIAGNOSIS — Z79899 Other long term (current) drug therapy: Secondary | ICD-10-CM | POA: Diagnosis not present

## 2016-02-15 DIAGNOSIS — E7800 Pure hypercholesterolemia, unspecified: Secondary | ICD-10-CM | POA: Diagnosis not present

## 2016-02-22 ENCOUNTER — Other Ambulatory Visit: Payer: Self-pay | Admitting: Internal Medicine

## 2016-02-22 DIAGNOSIS — E7800 Pure hypercholesterolemia, unspecified: Secondary | ICD-10-CM | POA: Diagnosis not present

## 2016-02-22 DIAGNOSIS — I471 Supraventricular tachycardia: Secondary | ICD-10-CM | POA: Diagnosis not present

## 2016-02-22 DIAGNOSIS — R1012 Left upper quadrant pain: Secondary | ICD-10-CM | POA: Diagnosis not present

## 2016-02-22 DIAGNOSIS — M436 Torticollis: Secondary | ICD-10-CM | POA: Diagnosis not present

## 2016-02-22 DIAGNOSIS — I10 Essential (primary) hypertension: Secondary | ICD-10-CM | POA: Diagnosis not present

## 2016-03-01 ENCOUNTER — Ambulatory Visit
Admission: RE | Admit: 2016-03-01 | Discharge: 2016-03-01 | Disposition: A | Discharge status: Home / Self Care | Payer: Commercial Managed Care - HMO | Source: Ambulatory Visit | Attending: Internal Medicine | Admitting: Internal Medicine

## 2016-03-01 DIAGNOSIS — R1012 Left upper quadrant pain: Secondary | ICD-10-CM | POA: Diagnosis not present

## 2016-04-30 DIAGNOSIS — R300 Dysuria: Secondary | ICD-10-CM | POA: Diagnosis not present

## 2016-05-14 ENCOUNTER — Observation Stay
Admission: EM | Admit: 2016-05-14 | Discharge: 2016-05-16 | DRG: 392 | Disposition: A | Discharge status: Home / Self Care | Payer: Commercial Managed Care - HMO | Attending: Internal Medicine | Admitting: Internal Medicine

## 2016-05-14 ENCOUNTER — Encounter: Payer: Self-pay | Admitting: Emergency Medicine

## 2016-05-14 DIAGNOSIS — K529 Noninfective gastroenteritis and colitis, unspecified: Secondary | ICD-10-CM | POA: Diagnosis not present

## 2016-05-14 DIAGNOSIS — Z87440 Personal history of urinary (tract) infections: Secondary | ICD-10-CM | POA: Diagnosis not present

## 2016-05-14 DIAGNOSIS — I471 Supraventricular tachycardia: Secondary | ICD-10-CM | POA: Diagnosis present

## 2016-05-14 DIAGNOSIS — Z880 Allergy status to penicillin: Secondary | ICD-10-CM | POA: Diagnosis not present

## 2016-05-14 DIAGNOSIS — I2510 Atherosclerotic heart disease of native coronary artery without angina pectoris: Secondary | ICD-10-CM | POA: Diagnosis present

## 2016-05-14 DIAGNOSIS — Z90710 Acquired absence of both cervix and uterus: Secondary | ICD-10-CM

## 2016-05-14 DIAGNOSIS — Z79899 Other long term (current) drug therapy: Secondary | ICD-10-CM | POA: Diagnosis not present

## 2016-05-14 DIAGNOSIS — K625 Hemorrhage of anus and rectum: Secondary | ICD-10-CM | POA: Diagnosis not present

## 2016-05-14 DIAGNOSIS — Z7982 Long term (current) use of aspirin: Secondary | ICD-10-CM | POA: Diagnosis not present

## 2016-05-14 DIAGNOSIS — K922 Gastrointestinal hemorrhage, unspecified: Secondary | ICD-10-CM

## 2016-05-14 DIAGNOSIS — Z9889 Other specified postprocedural states: Secondary | ICD-10-CM | POA: Diagnosis not present

## 2016-05-14 DIAGNOSIS — Z882 Allergy status to sulfonamides status: Secondary | ICD-10-CM | POA: Diagnosis not present

## 2016-05-14 DIAGNOSIS — J45909 Unspecified asthma, uncomplicated: Secondary | ICD-10-CM | POA: Diagnosis not present

## 2016-05-14 DIAGNOSIS — N179 Acute kidney failure, unspecified: Secondary | ICD-10-CM | POA: Diagnosis not present

## 2016-05-14 DIAGNOSIS — I10 Essential (primary) hypertension: Secondary | ICD-10-CM | POA: Diagnosis not present

## 2016-05-14 DIAGNOSIS — M1990 Unspecified osteoarthritis, unspecified site: Secondary | ICD-10-CM | POA: Diagnosis present

## 2016-05-14 DIAGNOSIS — Z9660 Presence of unspecified orthopedic joint implant: Secondary | ICD-10-CM | POA: Diagnosis not present

## 2016-05-14 LAB — URINALYSIS COMPLETE WITH MICROSCOPIC (ARMC ONLY)
BILIRUBIN URINE: NEGATIVE
GLUCOSE, UA: NEGATIVE mg/dL
KETONES UR: NEGATIVE mg/dL
LEUKOCYTES UA: NEGATIVE
Nitrite: NEGATIVE
Protein, ur: NEGATIVE mg/dL
Specific Gravity, Urine: 1.009 (ref 1.005–1.030)
pH: 5 (ref 5.0–8.0)

## 2016-05-14 LAB — GASTROINTESTINAL PANEL BY PCR, STOOL (REPLACES STOOL CULTURE)
ADENOVIRUS F40/41: NOT DETECTED
ASTROVIRUS: NOT DETECTED
Campylobacter species: NOT DETECTED
Cryptosporidium: NOT DETECTED
Cyclospora cayetanensis: NOT DETECTED
E. COLI O157: NOT DETECTED
ENTAMOEBA HISTOLYTICA: NOT DETECTED
ENTEROAGGREGATIVE E COLI (EAEC): NOT DETECTED
ENTEROPATHOGENIC E COLI (EPEC): NOT DETECTED
ENTEROTOXIGENIC E COLI (ETEC): NOT DETECTED
GIARDIA LAMBLIA: NOT DETECTED
NOROVIRUS GI/GII: NOT DETECTED
Plesimonas shigelloides: NOT DETECTED
Rotavirus A: NOT DETECTED
SHIGA LIKE TOXIN PRODUCING E COLI (STEC): NOT DETECTED
Salmonella species: NOT DETECTED
Sapovirus (I, II, IV, and V): NOT DETECTED
Shigella/Enteroinvasive E coli (EIEC): NOT DETECTED
VIBRIO CHOLERAE: NOT DETECTED
Vibrio species: NOT DETECTED
Yersinia enterocolitica: NOT DETECTED

## 2016-05-14 LAB — CBC WITH DIFFERENTIAL/PLATELET
Basophils Absolute: 0 10*3/uL (ref 0–0.1)
Basophils Relative: 0 %
EOS PCT: 0 %
Eosinophils Absolute: 0 10*3/uL (ref 0–0.7)
HEMATOCRIT: 43.8 % (ref 35.0–47.0)
Hemoglobin: 14.9 g/dL (ref 12.0–16.0)
LYMPHS ABS: 0.5 10*3/uL — AB (ref 1.0–3.6)
LYMPHS PCT: 3 %
MCH: 30.7 pg (ref 26.0–34.0)
MCHC: 34.1 g/dL (ref 32.0–36.0)
MCV: 90.1 fL (ref 80.0–100.0)
MONO ABS: 1.2 10*3/uL — AB (ref 0.2–0.9)
MONOS PCT: 7 %
NEUTROS ABS: 15.3 10*3/uL — AB (ref 1.4–6.5)
Neutrophils Relative %: 90 %
PLATELETS: 328 10*3/uL (ref 150–440)
RBC: 4.86 MIL/uL (ref 3.80–5.20)
RDW: 13.7 % (ref 11.5–14.5)
WBC: 17 10*3/uL — ABNORMAL HIGH (ref 3.6–11.0)

## 2016-05-14 LAB — COMPREHENSIVE METABOLIC PANEL
ALT: 28 U/L (ref 14–54)
ANION GAP: 14 (ref 5–15)
AST: 47 U/L — AB (ref 15–41)
Albumin: 5 g/dL (ref 3.5–5.0)
Alkaline Phosphatase: 233 U/L — ABNORMAL HIGH (ref 38–126)
BILIRUBIN TOTAL: 0.7 mg/dL (ref 0.3–1.2)
BUN: 34 mg/dL — AB (ref 6–20)
CHLORIDE: 101 mmol/L (ref 101–111)
CO2: 22 mmol/L (ref 22–32)
Calcium: 8.9 mg/dL (ref 8.9–10.3)
Creatinine, Ser: 1.86 mg/dL — ABNORMAL HIGH (ref 0.44–1.00)
GFR, EST AFRICAN AMERICAN: 29 mL/min — AB (ref >60)
GFR, EST NON AFRICAN AMERICAN: 25 mL/min — AB (ref >60)
Glucose, Bld: 194 mg/dL — ABNORMAL HIGH (ref 65–99)
POTASSIUM: 4.5 mmol/L (ref 3.5–5.1)
Sodium: 137 mmol/L (ref 135–145)
TOTAL PROTEIN: 8.2 g/dL — AB (ref 6.5–8.1)

## 2016-05-14 LAB — HEMOGLOBIN
HEMOGLOBIN: 13 g/dL (ref 12.0–16.0)
Hemoglobin: 12.4 g/dL (ref 12.0–16.0)

## 2016-05-14 LAB — TYPE AND SCREEN
ABO/RH(D): O POS
ANTIBODY SCREEN: NEGATIVE

## 2016-05-14 LAB — MAGNESIUM: MAGNESIUM: 2.4 mg/dL (ref 1.7–2.4)

## 2016-05-14 MED ORDER — ACETAMINOPHEN 325 MG PO TABS: 650.0000 mg | ORAL_TABLET | Freq: Four times a day (QID) | ORAL | Status: DC | PRN

## 2016-05-14 MED ORDER — ONDANSETRON HCL 4 MG PO TABS: 4.0000 mg | ORAL_TABLET | Freq: Four times a day (QID) | ORAL | Status: DC | PRN

## 2016-05-14 MED ORDER — DILTIAZEM HCL ER COATED BEADS 180 MG PO CP24
180.0000 mg | ORAL_CAPSULE | Freq: Two times a day (BID) | ORAL | Status: DC
Start: 2016-05-14 — End: 2016-05-16
  Administered 2016-05-14 – 2016-05-16 (×4): 180 mg via ORAL
  Filled 2016-05-14 (×5): qty 1

## 2016-05-14 MED ORDER — SODIUM CHLORIDE 0.9 % IV BOLUS (SEPSIS)
1000.0000 mL | Freq: Once | INTRAVENOUS | Status: AC
  Administered 2016-05-14: 1000 mL via INTRAVENOUS

## 2016-05-14 MED ORDER — ACETAMINOPHEN 650 MG RE SUPP: 650.0000 mg | Freq: Four times a day (QID) | RECTAL | Status: DC | PRN

## 2016-05-14 MED ORDER — ADULT MULTIVITAMIN W/MINERALS CH
1.0000 | ORAL_TABLET | Freq: Every day | ORAL | Status: DC
  Administered 2016-05-14 – 2016-05-16 (×3): 1 via ORAL
  Filled 2016-05-14 (×4): qty 1

## 2016-05-14 MED ORDER — CYCLOBENZAPRINE HCL 10 MG PO TABS: 5.0000 mg | ORAL_TABLET | Freq: Three times a day (TID) | ORAL | Status: DC | PRN

## 2016-05-14 MED ORDER — CALCIUM CARBONATE ANTACID 500 MG PO CHEW
500.0000 mg | CHEWABLE_TABLET | Freq: Every day | ORAL | Status: DC
  Administered 2016-05-14 – 2016-05-16 (×3): 500 mg via ORAL
  Filled 2016-05-14 (×3): qty 1

## 2016-05-14 MED ORDER — ONDANSETRON HCL 4 MG/2ML IJ SOLN: 4.0000 mg | Freq: Once | INTRAMUSCULAR | Status: DC

## 2016-05-14 MED ORDER — MONTELUKAST SODIUM 10 MG PO TABS
10.0000 mg | ORAL_TABLET | Freq: Every day | ORAL | Status: DC
  Administered 2016-05-14 – 2016-05-15 (×2): 10 mg via ORAL
  Filled 2016-05-14 (×2): qty 1

## 2016-05-14 MED ORDER — LORATADINE 10 MG PO TABS
10.0000 mg | ORAL_TABLET | Freq: Every day | ORAL | Status: DC
  Administered 2016-05-15 – 2016-05-16 (×2): 10 mg via ORAL
  Filled 2016-05-14 (×2): qty 1

## 2016-05-14 MED ORDER — METRONIDAZOLE IN NACL 5-0.79 MG/ML-% IV SOLN
500.0000 mg | Freq: Three times a day (TID) | INTRAVENOUS | Status: DC
  Administered 2016-05-14 – 2016-05-15 (×4): 500 mg via INTRAVENOUS
  Filled 2016-05-14 (×4): qty 100

## 2016-05-14 MED ORDER — METRONIDAZOLE IN NACL 5-0.79 MG/ML-% IV SOLN
INTRAVENOUS | Status: AC
  Filled 2016-05-14: qty 100

## 2016-05-14 MED ORDER — SODIUM CHLORIDE 0.9 % IV SOLN
INTRAVENOUS | Status: DC
  Administered 2016-05-14 – 2016-05-15 (×2): via INTRAVENOUS

## 2016-05-14 MED ORDER — ONDANSETRON HCL 4 MG/2ML IJ SOLN: 4.0000 mg | Freq: Four times a day (QID) | INTRAMUSCULAR | Status: DC | PRN

## 2016-05-14 NOTE — H&P (Signed)
[place] [facility] [place] NAME: Mary Thornton    MR#:  [id number]  DATE OF BIRTH:  1941/10/27  DATE OF ADMISSION:  05/14/2016  PRIMARY CARE PHYSICIAN: SPARKS,JEFFREY D, MD   REQUESTING/REFERRING PHYSICIAN: Dr. Rudene Re  CHIEF COMPLAINT:   Chief Complaint  Patient presents with  . Emesis  . Diarrhea    HISTORY OF PRESENT ILLNESS:  Mary Thornton  is a 75 y.o. female with a known history of Reactive disease, hypertension, arthritis, fibrocystic breast disease, paroxysmal SVT, diverticulosis presents to the hospital secondary to intractable nausea, vomiting and diarrhea that started yesterday. Patient states she was in her normal state of health, ate fish at a restaurant yesterday afternoon. Her symptoms didn't start until late yesterday evening. She actually had leftover of the same fish last evening for supper as well. Initially started with nausea and vomiting. It was nonbloody vomitus. Last night she started having loose stools followed by clear watery diarrhea with fresh blood in it. She had several of those episodes almost 10 overnight. Currently denies any diarrhea. Denies any abdominal pain. Fresh blood was present in rectal vault on exam. Complains of nausea. No fevers noted. Elevated white count on the labs.  PAST MEDICAL HISTORY:   Past Medical History  Diagnosis Date  . Coronary artery disease   . Hypertension   . Arthritis   . Hyperlipemia   . Reactive airway disease   . Environmental allergies   . Recurrent sinusitis   . Fibrocystic breast disease   . Torticollis   . PSVT (paroxysmal supraventricular tachycardia) ([place])     PAST SURGICAL HISTORY:   Past Surgical History  Procedure Laterality Date  . Knee arthroscopy    . Abdominal hysterectomy    . Torticollis surgery x 2    . Joint replacement    . Esophagogastroduodenoscopy (egd) with propofol N/A 06/23/2015    Procedure: ESOPHAGOGASTRODUODENOSCOPY (EGD)  WITH PROPOFOL;  Surgeon: [name], MD;  Location: [facility] ENDOSCOPY;  Service: Endoscopy;  Laterality: N/A;  . Colonoscopy with propofol N/A 06/23/2015    Procedure: COLONOSCOPY WITH PROPOFOL;  Surgeon: [name], MD;  Location: [facility] ENDOSCOPY;  Service: Endoscopy;  Laterality: N/A;  . Breast biopsy Left     neg    SOCIAL HISTORY:   Social History  Substance Use Topics  . Smoking status: Never Smoker   . Smokeless tobacco: Not on file  . Alcohol Use: No    FAMILY HISTORY:  History reviewed. No pertinent family history.  DRUG ALLERGIES:   Allergies  Allergen Reactions  . Penicillins Itching and Rash    Has patient had a PCN reaction causing immediate rash, facial/tongue/throat swelling, SOB or lightheadedness with hypotension: Yes Has patient had a PCN reaction causing severe rash involving mucus membranes or skin necrosis: No Has patient had a PCN reaction that required hospitalization No Has patient had a PCN reaction occurring within the last 10 years: No If all of the above answers are "NO", then may proceed with Cephalosporin use.  . Sulfa Antibiotics Rash    REVIEW OF SYSTEMS:   Review of Systems  Constitutional: Positive for malaise/fatigue. Negative for fever, chills and weight loss.  HENT: Negative for ear discharge, ear pain, nosebleeds and tinnitus.   Eyes: Negative for blurred vision, double vision and photophobia.  Respiratory: Negative for cough, hemoptysis, shortness of breath and wheezing.   Cardiovascular: Negative for chest pain, palpitations, orthopnea and leg swelling.  Gastrointestinal: Positive for  nausea, vomiting, diarrhea and blood in stool. Negative for heartburn, abdominal pain, constipation and melena.  Genitourinary: Negative for dysuria, urgency, frequency and hematuria.  Musculoskeletal: Negative for myalgias, back pain and neck pain.  Skin: Negative for rash.  Neurological: Negative for dizziness, tingling, tremors, sensory  change, speech change, focal weakness and headaches.  Endo/Heme/Allergies: Does not bruise/bleed easily.  Psychiatric/Behavioral: Negative for depression.    MEDICATIONS AT HOME:   Prior to Admission medications   Medication Sig Start Date End Date Taking? Authorizing Provider  acetaminophen (TYLENOL) 325 MG tablet Take 325-650 mg by mouth every 6 (six) hours as needed for mild pain or moderate pain.    Yes Historical Provider, MD  aspirin 81 MG EC tablet Take 81 mg by mouth daily. Swallow whole.   Yes Historical Provider, MD  betamethasone dipropionate (DIPROLENE) 0.05 % cream Apply topically 2 (two) times daily.   Yes Historical Provider, MD  calcium carbonate (OS-CAL) 600 MG TABS tablet Take 600 mg by mouth daily.    Yes Historical Provider, MD  CARTIA XT 180 MG 24 hr capsule Take 180 mg by mouth 2 (two) times daily. 03/03/16  Yes Historical Provider, MD  cyclobenzaprine (FLEXERIL) 5 MG tablet Take 5 mg by mouth 3 (three) times daily as needed for muscle spasms.   Yes Historical Provider, MD  diclofenac (VOLTAREN) 75 MG EC tablet Take 75 mg by mouth 2 (two) times daily.   Yes Historical Provider, MD  loratadine (CLARITIN) 10 MG tablet Take 10 mg by mouth daily.   Yes Historical Provider, MD  montelukast (SINGULAIR) 10 MG tablet Take 10 mg by mouth at bedtime.   Yes Historical Provider, MD  Multiple Vitamin (MULTIVITAMIN) tablet Take 1 tablet by mouth daily.   Yes Historical Provider, MD  ramipril (ALTACE) 5 MG capsule Take 5 mg by mouth daily.   Yes Historical Provider, MD      VITAL SIGNS:  Blood pressure 102/76, pulse 86, temperature 98 F (36.7 C), temperature source Oral, resp. rate 18, height 5\' 3"  (1.6 m), weight 56.246 kg (124 lb), SpO2 93 %.  PHYSICAL EXAMINATION:   Physical Exam  GENERAL:  75 y.o.-year-old patient lying in the bed with no acute distress.Well-nourished but thin built  EYES: Pupils equal, round, reactive to light and accommodation. No scleral icterus.  Extraocular muscles intact.  HEENT: Head atraumatic, normocephalic. Oropharynx and nasopharynx clear.  NECK:  Supple, no jugular venous distention. No thyroid enlargement, no tenderness.  LUNGS: Normal breath sounds bilaterally, no wheezing, rales,rhonchi or crepitation. No use of accessory muscles of respiration.  CARDIOVASCULAR: S1, S2 normal. No murmurs, rubs, or gallops.  ABDOMEN: Soft, nontender, nondistended. Bowel sounds present. No organomegaly or mass.  EXTREMITIES: No pedal edema, cyanosis, or clubbing.  NEUROLOGIC: Cranial nerves II through XII are intact. Muscle strength 5/5 in all extremities. Sensation intact. Gait not checked.  PSYCHIATRIC: The patient is alert and oriented x 3.  SKIN: No obvious rash, lesion, or ulcer.   LABORATORY PANEL:   CBC  Recent Labs Lab 05/14/16 0837 05/14/16 1209  WBC 17.0*  --   HGB 14.9 13.0  HCT 43.8  --   PLT 328  --    ------------------------------------------------------------------------------------------------------------------  Chemistries   Recent Labs Lab 05/14/16 0837  NA 137  K 4.5  CL 101  CO2 22  GLUCOSE 194*  BUN 34*  CREATININE 1.86*  CALCIUM 8.9  MG 2.4  AST 47*  ALT 28  ALKPHOS 233*  BILITOT 0.7   ------------------------------------------------------------------------------------------------------------------  Cardiac Enzymes No results for input(s): TROPONINI in the last 168 hours. ------------------------------------------------------------------------------------------------------------------  RADIOLOGY:  No results found.  EKG:   Orders placed or performed in visit on 10/31/14  . EKG 12-Lead    IMPRESSION AND PLAN:   Wakisha Matsen  is a 75 y.o. female with a known history of Reactive disease, hypertension, arthritis, fibrocystic breast disease, paroxysmal SVT, diverticulosis presents to the hospital secondary to intractable nausea, vomiting and diarrhea that started yesterday.  #1 acute  gastroenteritis-presenting with nausea, vomiting and diarrhea. -Send stool studies -IV fluids. Replace electrolytes as needed. Pain and nausea medications. -Empiric Flagyl with elevated white count. CT of the abdomen was not done. No abdominal tenderness. Less likely to be diverticulitis  #2 rectal bleed-likely from diarrhea. Has history of diverticulosis. -Stable hemoglobin. Check hemoglobin every 8 hours. -No indication for transfusion. Type and screen has been ordered.  #3 acute renal failure-likely prerenal from GI losses. -IV fluids and monitor carefully.  #4 hypertension-hold ramipril due to low normal blood pressures.  #5 history of paroxysmal SVT-continue Cardizem twice a day.  #6 DVT prophylaxis-due to rectal bleed, hold heparin products. Teds and SCDs for now    All the records are reviewed and case discussed with ED provider. Management plans discussed with the patient, family and they are in agreement.  CODE STATUS: Full code  TOTAL TIME TAKING CARE OF THIS PATIENT: 50 minutes.    Gladstone Lighter M.D on 05/14/2016 at 1:36 PM  Between 7am to 6pm - Pager - [phone]  After 6pm go to [email or web address] - password [email or web address] [place] [facility]  Office  [phone]  CC: Primary care physician; Idelle Crouch, MD

## 2016-05-14 NOTE — ED Provider Notes (Signed)
[facility] Emergency Department Provider Note  ____________________________________________  Time seen: Approximately 8:19 AM  I have reviewed the triage vital signs and the nursing notes.   HISTORY  Chief Complaint Emesis and Diarrhea   HPI Mary Thornton is a 75 y.o. female with a history of diverticulosis, CAD, hypertension, hyperlipidemia who presents for evaluation of bloody diarrhea. Patient reports that she has had between 5 and 10 episodes of nonbloody nonbilious emesis starting yesterday evening. Around 10 PM she started having bloody diarrhea. She reports more than 10 episodes of watery bloody diarrhea that she describes as dark red but not black. No prior h/o GIB. Not on anticoagulants. Patient reports that vomiting resolved this am but the bloody diarrhea has persisted. She denies abdominal pain, nausea, fever, CP, SOB, dizziness, syncope. Last colonoscopy was on 06/2015 showing diverticulosis. She also endorses dysuria and reports that she finished a course of cipro for a UTI last week but has had persistent mild dysuria since then.   Past Medical History  Diagnosis Date  . Coronary artery disease   . Hypertension   . Arthritis   . Hyperlipemia   . Reactive airway disease   . Environmental allergies   . Recurrent sinusitis   . Fibrocystic breast disease   . Torticollis   . PSVT (paroxysmal supraventricular tachycardia) ([place])     There are no active problems to display for this patient.   Past Surgical History  Procedure Laterality Date  . Knee arthroscopy    . Abdominal hysterectomy    . Torticollis surgery x 2    . Joint replacement    . Esophagogastroduodenoscopy (egd) with propofol N/A 06/23/2015    Procedure: ESOPHAGOGASTRODUODENOSCOPY (EGD) WITH PROPOFOL;  Surgeon: [name], MD;  Location: [facility] ENDOSCOPY;  Service: Endoscopy;  Laterality: N/A;  . Colonoscopy with propofol N/A 06/23/2015    Procedure: COLONOSCOPY WITH  PROPOFOL;  Surgeon: [name], MD;  Location: [facility] ENDOSCOPY;  Service: Endoscopy;  Laterality: N/A;  . Breast biopsy Left     neg    Current Outpatient Rx  Name  Route  Sig  Dispense  Refill  . acetaminophen (TYLENOL) 325 MG tablet   Oral   Take 650 mg by mouth daily.         Marland Kitchen aspirin 81 MG EC tablet   Oral   Take 81 mg by mouth daily. Swallow whole.         . betamethasone dipropionate (DIPROLENE) 0.05 % cream   Topical   Apply topically 2 (two) times daily.         . calcium carbonate (OS-CAL) 600 MG TABS tablet   Oral   Take 600 mg by mouth 2 (two) times daily with a meal.         . cyclobenzaprine (FLEXERIL) 5 MG tablet   Oral   Take 5 mg by mouth 3 (three) times daily as needed for muscle spasms.         . diclofenac (VOLTAREN) 75 MG EC tablet   Oral   Take 75 mg by mouth 2 (two) times daily.         Marland Kitchen loratadine (CLARITIN) 10 MG tablet   Oral   Take 10 mg by mouth daily.         . montelukast (SINGULAIR) 10 MG tablet   Oral   Take 10 mg by mouth at bedtime.         . Multiple Vitamin (MULTIVITAMIN) capsule  Oral   Take 1 capsule by mouth daily.         . ramipril (ALTACE) 5 MG capsule   Oral   Take 5 mg by mouth daily.           Allergies Penicillins and Sulfa antibiotics  No family history on file.  Social History Social History  Substance Use Topics  . Smoking status: Never Smoker   . Smokeless tobacco: None  . Alcohol Use: No    Review of Systems  Constitutional: Negative for fever. Eyes: Negative for visual changes. ENT: Negative for sore throat. Cardiovascular: Negative for chest pain. Respiratory: Negative for shortness of breath. Gastrointestinal: Negative for abdominal pain. + NBNB emesis and bloody diarrhea Genitourinary: + dysuria. Musculoskeletal: Negative for back pain. Skin: Negative for rash. Neurological: Negative for headaches, weakness or  numbness.  ____________________________________________   PHYSICAL EXAM:  VITAL SIGNS: ED Triage Vitals  Enc Vitals Group     BP 05/14/16 0757 121/58 mmHg     Pulse Rate 05/14/16 0757 101     Resp 05/14/16 0757 20     Temp 05/14/16 0757 98.4 F (36.9 C)     Temp Source 05/14/16 0757 Axillary     SpO2 05/14/16 0757 100 %     Weight 05/14/16 0757 124 lb (56.246 kg)     Height 05/14/16 0757 5\' 3"  (1.6 m)     Head Cir --      Peak Flow --      Pain Score 05/14/16 0759 0     Pain Loc --      Pain Edu? --      Excl. in [place]? --     Constitutional: Alert and oriented. Well appearing and in no apparent distress. HEENT:      Head: Normocephalic and atraumatic.         Eyes: Conjunctivae are normal. Sclera is non-icteric. EOMI. PERRL      Mouth/Throat: Mucous membranes are moist.       Neck: Supple with no signs of meningismus. Cardiovascular: Tachycardic with regular rhythm. No murmurs, gallops, or rubs. 2+ symmetrical distal pulses are present in all extremities. No JVD. Respiratory: Normal respiratory effort. Lungs are clear to auscultation bilaterally. No wheezes, crackles, or rhonchi.  Gastrointestinal: Soft, non tender, and non distended with positive bowel sounds. No rebound or guarding.Rectal exam showing red stool hemoccult positive. No melena. Genitourinary: No CVA tenderness. Musculoskeletal: Nontender with normal range of motion in all extremities. No edema, cyanosis, or erythema of extremities. Neurologic: Normal speech and language. Face is symmetric. Moving all extremities. No gross focal neurologic deficits are appreciated. Skin: Skin is warm, dry and intact. No rash noted. Psychiatric: Mood and affect are normal. Speech and behavior are normal.  ____________________________________________   LABS (all labs ordered are listed, but only abnormal results are displayed)  Labs Reviewed  CBC WITH DIFFERENTIAL/PLATELET - Abnormal; Notable for the following:    WBC 17.0  (*)    Neutro Abs 15.3 (*)    Lymphs Abs 0.5 (*)    Monocytes Absolute 1.2 (*)    All other components within normal limits  COMPREHENSIVE METABOLIC PANEL - Abnormal; Notable for the following:    Glucose, Bld 194 (*)    BUN 34 (*)    Creatinine, Ser 1.86 (*)    Total Protein 8.2 (*)    AST 47 (*)    Alkaline Phosphatase 233 (*)    GFR calc non Af Amer 25 (*)  GFR calc Af Amer 29 (*)    All other components within normal limits  URINE CULTURE  GASTROINTESTINAL PANEL BY PCR, STOOL (REPLACES STOOL CULTURE)  MAGNESIUM  URINALYSIS COMPLETEWITH MICROSCOPIC ([facility] ONLY)  TYPE AND SCREEN   ____________________________________________  EKG  none  ____________________________________________  RADIOLOGY  none  ____________________________________________   PROCEDURES  Procedure(s) performed: None Critical Care performed:  None ____________________________________________   INITIAL IMPRESSION / ASSESSMENT AND PLAN / ED COURSE  75 y.o. female with a history of diverticulosis, CAD, hypertension, hyperlipidemia who presents for evaluation of multiple episodes of nonbloody nonbilious emesis and multiple episodes of bloody diarrhea since last night. Patient is well appearing, no distress, mildly tachycardic, abdomen is soft and nontender, Hemoccult positive red stool with no melena on rectal exam. Presentation concerning for diverticular bleed versus infectious diarrhea which was preceded by vomiting especially in the setting of recent abx for UTI (cipro). We'll check electrolytes, CBC, type and screen, kidney function, check stool culture to rule out C. diff. We'll give IV fluids. We'll watch patient on telemetry. We'll also check a urinalysis as patient is complaining of dysuria.  _________________________ 10:23 AM on 05/14/2016 -----------------------------------------  Patient with AKI. Stable hemoglobin and hematocrit. Discussed with hospitalist for admission.  Pertinent  labs & imaging results that were available during my care of the patient were reviewed by me and considered in my medical decision making (see chart for details).    ____________________________________________   FINAL CLINICAL IMPRESSION(S) / ED DIAGNOSES  Final diagnoses:  Lower GI bleed  AKI (acute kidney injury) ([place])      NEW MEDICATIONS STARTED DURING THIS VISIT:  New Prescriptions   No medications on file     Note:  This document was prepared using Dragon voice recognition software and may include unintentional dictation errors.    Rudene Re, MD 05/14/16 1023

## 2016-05-14 NOTE — ED Notes (Signed)
Pt to ed with c/o vomiting and diarrhea.  Pt states this am early she noticed dark colored stool.

## 2016-05-15 DIAGNOSIS — K529 Noninfective gastroenteritis and colitis, unspecified: Secondary | ICD-10-CM | POA: Diagnosis not present

## 2016-05-15 DIAGNOSIS — K625 Hemorrhage of anus and rectum: Secondary | ICD-10-CM | POA: Diagnosis not present

## 2016-05-15 DIAGNOSIS — I10 Essential (primary) hypertension: Secondary | ICD-10-CM | POA: Diagnosis not present

## 2016-05-15 DIAGNOSIS — N179 Acute kidney failure, unspecified: Secondary | ICD-10-CM | POA: Diagnosis not present

## 2016-05-15 LAB — BASIC METABOLIC PANEL
ANION GAP: 5 (ref 5–15)
BUN: 14 mg/dL (ref 6–20)
CALCIUM: 8 mg/dL — AB (ref 8.9–10.3)
CO2: 23 mmol/L (ref 22–32)
Chloride: 111 mmol/L (ref 101–111)
Creatinine, Ser: 0.62 mg/dL (ref 0.44–1.00)
GFR calc non Af Amer: >60 mL/min (ref >60)
Glucose, Bld: 101 mg/dL — ABNORMAL HIGH (ref 65–99)
POTASSIUM: 3.7 mmol/L (ref 3.5–5.1)
Sodium: 139 mmol/L (ref 135–145)

## 2016-05-15 LAB — C DIFFICILE QUICK SCREEN W PCR REFLEX
C DIFFICILE (CDIFF) INTERP: NOT DETECTED
C DIFFICLE (CDIFF) ANTIGEN: NEGATIVE
C Diff toxin: NEGATIVE

## 2016-05-15 LAB — CBC
HCT: 33.1 % — ABNORMAL LOW (ref 35.0–47.0)
HEMOGLOBIN: 11.6 g/dL — AB (ref 12.0–16.0)
MCH: 31.2 pg (ref 26.0–34.0)
MCHC: 34.9 g/dL (ref 32.0–36.0)
MCV: 89.3 fL (ref 80.0–100.0)
Platelets: 230 10*3/uL (ref 150–440)
RBC: 3.71 MIL/uL — AB (ref 3.80–5.20)
RDW: 13.8 % (ref 11.5–14.5)
WBC: 10.2 10*3/uL (ref 3.6–11.0)

## 2016-05-15 LAB — HEMOGLOBIN: HEMOGLOBIN: 11.8 g/dL — AB (ref 12.0–16.0)

## 2016-05-15 MED ORDER — LOPERAMIDE HCL 2 MG PO CAPS
2.0000 mg | ORAL_CAPSULE | Freq: Four times a day (QID) | ORAL | Status: DC | PRN
  Filled 2016-05-15: qty 1

## 2016-05-15 MED ORDER — RISAQUAD PO CAPS
1.0000 | ORAL_CAPSULE | Freq: Two times a day (BID) | ORAL | Status: DC
  Administered 2016-05-15 – 2016-05-16 (×3): 1 via ORAL
  Filled 2016-05-15 (×3): qty 1

## 2016-05-15 MED ORDER — METRONIDAZOLE 500 MG PO TABS
500.0000 mg | ORAL_TABLET | Freq: Three times a day (TID) | ORAL | Status: DC
  Administered 2016-05-15 – 2016-05-16 (×3): 500 mg via ORAL
  Filled 2016-05-15 (×3): qty 1

## 2016-05-15 NOTE — Progress Notes (Signed)
[place]  at [place] NAME: Mary Thornton    MR#:  [id number]  DATE OF BIRTH:  [birth year]  SUBJECTIVE:  CHIEF COMPLAINT:   Chief Complaint  Patient presents with  . Emesis  . Diarrhea   - admitted with acute gastroenteritis, nausea, vomiting are better - still has diarrhea- several loose stools - c.diff pending  REVIEW OF SYSTEMS:  Review of Systems  Constitutional: Positive for malaise/fatigue. Negative for fever and chills.  HENT: Negative for ear discharge, ear pain and nosebleeds.   Eyes: Negative for blurred vision and double vision.  Respiratory: Negative for cough, shortness of breath and wheezing.   Cardiovascular: Negative for chest pain, palpitations and leg swelling.  Gastrointestinal: Positive for diarrhea. Negative for nausea, vomiting, abdominal pain and constipation.  Genitourinary: Negative for dysuria and urgency.  Musculoskeletal: Negative for myalgias.  Neurological: Negative for dizziness, sensory change, speech change, focal weakness, seizures and headaches.  Psychiatric/Behavioral: Negative for depression.    DRUG ALLERGIES:   Allergies  Allergen Reactions  . Penicillins Itching and Rash    Has patient had a PCN reaction causing immediate rash, facial/tongue/throat swelling, SOB or lightheadedness with hypotension: Yes Has patient had a PCN reaction causing severe rash involving mucus membranes or skin necrosis: No Has patient had a PCN reaction that required hospitalization No Has patient had a PCN reaction occurring within the last 10 years: No If all of the above answers are "NO", then may proceed with Cephalosporin use.  . Sulfa Antibiotics Rash    VITALS:  Blood pressure 124/60, pulse 67, temperature 98.3 F (36.8 C), temperature source Oral, resp. rate 20, height 5\' 3"  (1.6 m), weight 56.246 kg (124 lb), SpO2 97 %.  PHYSICAL EXAMINATION:  Physical Exam  GENERAL: 75 y.o.-year-old patient  lying in the bed with no acute distress.Well-nourished but thin built  EYES: Pupils equal, round, reactive to light and accommodation. No scleral icterus. Extraocular muscles intact.  HEENT: Head atraumatic, normocephalic. Oropharynx and nasopharynx clear.  NECK: Supple, no jugular venous distention. No thyroid enlargement, no tenderness.  LUNGS: Normal breath sounds bilaterally, no wheezing, rales,rhonchi or crepitation. No use of accessory muscles of respiration.  CARDIOVASCULAR: S1, S2 normal. No murmurs, rubs, or gallops.  ABDOMEN: Soft, nontender, nondistended. Bowel sounds present. No organomegaly or mass.  EXTREMITIES: No pedal edema, cyanosis, or clubbing.  NEUROLOGIC: Cranial nerves II through XII are intact. Muscle strength 5/5 in all extremities. Sensation intact. Gait not checked.  PSYCHIATRIC: The patient is alert and oriented x 3.  SKIN: No obvious rash, lesion, or ulcer.     LABORATORY PANEL:   CBC  Recent Labs Lab 05/15/16 0539 05/15/16 1204  WBC 10.2  --   HGB 11.6* 11.8*  HCT 33.1*  --   PLT 230  --    ------------------------------------------------------------------------------------------------------------------  Chemistries   Recent Labs Lab 05/14/16 0837 05/15/16 0539  NA 137 139  K 4.5 3.7  CL 101 111  CO2 22 23  GLUCOSE 194* 101*  BUN 34* 14  CREATININE 1.86* 0.62  CALCIUM 8.9 8.0*  MG 2.4  --   AST 47*  --   ALT 28  --   ALKPHOS 233*  --   BILITOT 0.7  --    ------------------------------------------------------------------------------------------------------------------  Cardiac Enzymes No results for input(s): TROPONINI in the last 168 hours. ------------------------------------------------------------------------------------------------------------------  RADIOLOGY:  No results found.  EKG:   Orders placed or performed in visit on 10/31/14  . EKG 12-Lead  ASSESSMENT AND PLAN:   Mary Thornton is a 75 y.o.  female with a known history of Reactive disease, hypertension, arthritis, fibrocystic breast disease, paroxysmal SVT, diverticulosis presents to the hospital secondary to intractable nausea, vomiting and diarrhea that started yesterday.  #1 acute gastroenteritis-presenting with nausea, vomiting and diarrhea. -stool studies negative, c.diff pending- especially as patient was on cipro recently for UTI - add probiotics, imodium if c.diff negative -IV fluids. Replace electrolytes as needed. Pain and nausea medications. -Empiric Flagyl for now. CT of the abdomen was not done. No abdominal pain.  #2 rectal bleed-likely from diarrhea. Has history of diverticulosis. -Stable hemoglobin. Resolved now  #3 acute renal failure-likely prerenal from GI losses. -Improved with IV fluids  #4 hypertension-hold ramipril due to renal failure and low normal blood pressures.  #5 history of paroxysmal SVT-continue Cardizem twice a day.  #6 DVT prophylaxis-due to rectal bleed, hold heparin products. Teds and SCDs for now     All the records are reviewed and case discussed with Care Management/Social Workerr. Management plans discussed with the patient, family and they are in agreement.  CODE STATUS: Full Code  TOTAL TIME TAKING CARE OF THIS PATIENT: 37 minutes.   POSSIBLE D/C TOMORROW, DEPENDING ON CLINICAL CONDITION.   Gladstone Lighter M.D on 05/15/2016 at 12:39 PM  Between 7am to 6pm - Pager - [phone]  After 6pm go to [email or web address] - password [email or web address] [place] [facility]  Office  [phone]  CC: Primary care physician; Idelle Crouch, MD

## 2016-05-16 DIAGNOSIS — K529 Noninfective gastroenteritis and colitis, unspecified: Secondary | ICD-10-CM | POA: Diagnosis not present

## 2016-05-16 DIAGNOSIS — N179 Acute kidney failure, unspecified: Secondary | ICD-10-CM | POA: Diagnosis not present

## 2016-05-16 DIAGNOSIS — I10 Essential (primary) hypertension: Secondary | ICD-10-CM | POA: Diagnosis not present

## 2016-05-16 DIAGNOSIS — K625 Hemorrhage of anus and rectum: Secondary | ICD-10-CM | POA: Diagnosis not present

## 2016-05-16 LAB — BASIC METABOLIC PANEL
ANION GAP: 6 (ref 5–15)
BUN: 9 mg/dL (ref 6–20)
CHLORIDE: 108 mmol/L (ref 101–111)
CO2: 24 mmol/L (ref 22–32)
Calcium: 8.1 mg/dL — ABNORMAL LOW (ref 8.9–10.3)
Creatinine, Ser: 0.52 mg/dL (ref 0.44–1.00)
GFR calc Af Amer: >60 mL/min (ref >60)
GLUCOSE: 97 mg/dL (ref 65–99)
POTASSIUM: 3.6 mmol/L (ref 3.5–5.1)
Sodium: 138 mmol/L (ref 135–145)

## 2016-05-16 MED ORDER — METRONIDAZOLE 500 MG PO TABS: 500.0000 mg | ORAL_TABLET | Freq: Three times a day (TID) | ORAL | Status: DC

## 2016-05-16 MED ORDER — RISAQUAD PO CAPS: 1.0000 | ORAL_CAPSULE | Freq: Two times a day (BID) | ORAL | Status: DC

## 2016-05-16 NOTE — Care Management Important Message (Signed)
Important Message  Patient Details  Name: Mary Thornton MRN: [id number] Date of Birth: Sep 07, 1941   Medicare Important Message Given:  N/A - LOS <3 / Initial given by admissions    Beverly Sessions, RN 05/16/2016, 10:04 AM

## 2016-05-16 NOTE — Discharge Summary (Signed)
[facility] NAME: Mary Thornton    MR#:  [id number]  DATE OF BIRTH:  1941-08-01  DATE OF ADMISSION:  05/14/2016 ADMITTING PHYSICIAN: Gladstone Lighter, MD  DATE OF DISCHARGE: 05/16/16  PRIMARY CARE PHYSICIAN: SPARKS,JEFFREY D, MD    ADMISSION DIAGNOSIS:  Lower GI bleed [K92.2] AKI (acute kidney injury) ([place]) [N17.9]  DISCHARGE DIAGNOSIS:  Active Problems:   ARF (acute renal failure) ([place])   SECONDARY DIAGNOSIS:   Past Medical History  Diagnosis Date  . Coronary artery disease   . Hypertension   . Arthritis   . Hyperlipemia   . Reactive airway disease   . Environmental allergies   . Recurrent sinusitis   . Fibrocystic breast disease   . Torticollis   . PSVT (paroxysmal supraventricular tachycardia) [facility])     HOSPITAL COURSE:   Mary Thornton is a 75 y.o. female with a known history of Reactive disease, hypertension, arthritis, fibrocystic breast disease, paroxysmal SVT, diverticulosis presents to the hospital secondary to intractable nausea, vomiting and diarrhea that started yesterday.  #1 Acute gastroenteritis-presenting with nausea, vomiting and diarrhea. -stool studies negative, c.diff negative- especially as patient was on cipro recently for UTI - added probiotics, imodium prn- diarrhea improved however -received IV fluids.  -Empiric Flagyl for elevated wbc on admission- finish a 7 day course total.. CT of the abdomen was not done. No abdominal pain.  #2 rectal bleed-likely from diarrhea. Has history of diverticulosis. -Stable hemoglobin. Resolved now  #3 acute renal failure-likely prerenal from GI losses. -Improved with IV fluids  #4 hypertension- on ramipril and cartia  #5 history of paroxysmal SVT-continue Cardizem twice a day.  Stable for discharge today.   DISCHARGE CONDITIONS:   Stable  CONSULTS OBTAINED:   None  DRUG ALLERGIES:   Allergies  Allergen Reactions  . Penicillins  Itching and Rash    Has patient had a PCN reaction causing immediate rash, facial/tongue/throat swelling, SOB or lightheadedness with hypotension: Yes Has patient had a PCN reaction causing severe rash involving mucus membranes or skin necrosis: No Has patient had a PCN reaction that required hospitalization No Has patient had a PCN reaction occurring within the last 10 years: No If all of the above answers are "NO", then may proceed with Cephalosporin use.  . Sulfa Antibiotics Rash    DISCHARGE MEDICATIONS:   Current Discharge Medication List    START taking these medications   Details  acidophilus (RISAQUAD) CAPS capsule Take 1 capsule by mouth 2 (two) times daily. Qty: 14 capsule, Refills: 0    metroNIDAZOLE (FLAGYL) 500 MG tablet Take 1 tablet (500 mg total) by mouth 3 (three) times daily. X 5 more days Qty: 15 tablet, Refills: 0      CONTINUE these medications which have NOT CHANGED   Details  acetaminophen (TYLENOL) 325 MG tablet Take 325-650 mg by mouth every 6 (six) hours as needed for mild pain or moderate pain.     betamethasone dipropionate (DIPROLENE) 0.05 % cream Apply topically 2 (two) times daily.    calcium carbonate (OS-CAL) 600 MG TABS tablet Take 600 mg by mouth daily.     CARTIA XT 180 MG 24 hr capsule Take 180 mg by mouth 2 (two) times daily.    cyclobenzaprine (FLEXERIL) 5 MG tablet Take 5 mg by mouth 3 (three) times daily as needed for muscle spasms.    diclofenac (VOLTAREN) 75 MG EC tablet Take 75 mg by mouth 2 (two) times daily.  loratadine (CLARITIN) 10 MG tablet Take 10 mg by mouth daily.    montelukast (SINGULAIR) 10 MG tablet Take 10 mg by mouth at bedtime.    Multiple Vitamin (MULTIVITAMIN) tablet Take 1 tablet by mouth daily.    ramipril (ALTACE) 5 MG capsule Take 5 mg by mouth daily.      STOP taking these medications     aspirin 81 MG EC tablet          DISCHARGE INSTRUCTIONS:   1. PCP follow-up in 1- 2 weeks  If you  experience worsening of your admission symptoms, develop shortness of breath, life threatening emergency, suicidal or homicidal thoughts you must seek medical attention immediately by calling 911 or calling your MD immediately  if symptoms less severe.  You Must read complete instructions/literature along with all the possible adverse reactions/side effects for all the Medicines you take and that have been prescribed to you. Take any new Medicines after you have completely understood and accept all the possible adverse reactions/side effects.   Please note  You were cared for by a hospitalist during your hospital stay. If you have any questions about your discharge medications or the care you received while you were in the hospital after you are discharged, you can call the unit and asked to speak with the hospitalist on call if the hospitalist that took care of you is not available. Once you are discharged, your primary care physician will handle any further medical issues. Please note that NO REFILLS for any discharge medications will be authorized once you are discharged, as it is imperative that you return to your primary care physician (or establish a relationship with a primary care physician if you do not have one) for your aftercare needs so that they can reassess your need for medications and monitor your lab values.    Today   CHIEF COMPLAINT:   Chief Complaint  Patient presents with  . Emesis  . Diarrhea    VITAL SIGNS:  Blood pressure 126/57, pulse 57, temperature 97.4 F (36.3 C), temperature source Oral, resp. rate 16, height 5\' 3"  (1.6 m), weight 56.246 kg (124 lb), SpO2 96 %.  I/O:   Intake/Output Summary (Last 24 hours) at 05/16/16 0848 Last data filed at 05/16/16 0700  Gross per 24 hour  Intake   2776 ml  Output   5400 ml  Net  -2624 ml    PHYSICAL EXAMINATION:   Physical Exam  GENERAL: 75 y.o.-year-old patient lying in the bed with no acute  distress.Well-nourished but thin built  EYES: Pupils equal, round, reactive to light and accommodation. No scleral icterus. Extraocular muscles intact.  HEENT: Head atraumatic, normocephalic. Oropharynx and nasopharynx clear.  NECK: Supple, no jugular venous distention. No thyroid enlargement, no tenderness.  LUNGS: Normal breath sounds bilaterally, no wheezing, rales,rhonchi or crepitation. No use of accessory muscles of respiration.  CARDIOVASCULAR: S1, S2 normal. No murmurs, rubs, or gallops.  ABDOMEN: Soft, nontender, nondistended. Bowel sounds present. No organomegaly or mass.  EXTREMITIES: No pedal edema, cyanosis, or clubbing.  NEUROLOGIC: Cranial nerves II through XII are intact. Muscle strength 5/5 in all extremities. Sensation intact. Gait not checked.  PSYCHIATRIC: The patient is alert and oriented x 3.  SKIN: No obvious rash, lesion, or ulcer.   DATA REVIEW:   CBC  Recent Labs Lab 05/15/16 0539 05/15/16 1204  WBC 10.2  --   HGB 11.6* 11.8*  HCT 33.1*  --   PLT 230  --  Chemistries   Recent Labs Lab 05/14/16 0837  05/16/16 0728  NA 137  < > 138  K 4.5  < > 3.6  CL 101  < > 108  CO2 22  < > 24  GLUCOSE 194*  < > 97  BUN 34*  < > 9  CREATININE 1.86*  < > 0.52  CALCIUM 8.9  < > 8.1*  MG 2.4  --   --   AST 47*  --   --   ALT 28  --   --   ALKPHOS 233*  --   --   BILITOT 0.7  --   --   < > = values in this interval not displayed.  Cardiac Enzymes No results for input(s): TROPONINI in the last 168 hours.  Microbiology Results  Results for orders placed or performed during the hospital encounter of 05/14/16  Gastrointestinal Panel by PCR , Stool     Status: None   Collection Time: 05/14/16  8:15 PM  Result Value Ref Range Status   Campylobacter species NOT DETECTED NOT DETECTED Final   Plesimonas shigelloides NOT DETECTED NOT DETECTED Final   Salmonella species NOT DETECTED NOT DETECTED Final   Yersinia enterocolitica NOT DETECTED NOT  DETECTED Final   Vibrio species NOT DETECTED NOT DETECTED Final   Vibrio cholerae NOT DETECTED NOT DETECTED Final   Enteroaggregative E coli (EAEC) NOT DETECTED NOT DETECTED Final   Enteropathogenic E coli (EPEC) NOT DETECTED NOT DETECTED Final   Enterotoxigenic E coli (ETEC) NOT DETECTED NOT DETECTED Final   Shiga like toxin producing E coli (STEC) NOT DETECTED NOT DETECTED Final   E. coli O157 NOT DETECTED NOT DETECTED Final   Shigella/Enteroinvasive E coli (EIEC) NOT DETECTED NOT DETECTED Final   Cryptosporidium NOT DETECTED NOT DETECTED Final   Cyclospora cayetanensis NOT DETECTED NOT DETECTED Final   Entamoeba histolytica NOT DETECTED NOT DETECTED Final   Giardia lamblia NOT DETECTED NOT DETECTED Final   Adenovirus F40/41 NOT DETECTED NOT DETECTED Final   Astrovirus NOT DETECTED NOT DETECTED Final   Norovirus GI/GII NOT DETECTED NOT DETECTED Final   Rotavirus A NOT DETECTED NOT DETECTED Final   Sapovirus (I, II, IV, and V) NOT DETECTED NOT DETECTED Final  C difficile quick scan w PCR reflex     Status: None   Collection Time: 05/15/16 11:38 AM  Result Value Ref Range Status   C Diff antigen NEGATIVE NEGATIVE Final   C Diff toxin NEGATIVE NEGATIVE Final   C Diff interpretation No C. difficile detected.  Final    RADIOLOGY:  No results found.  EKG:   Orders placed or performed in visit on 10/31/14  . EKG 12-Lead      Management plans discussed with the patient, family and they are in agreement.  CODE STATUS:     Code Status Orders        Start     Ordered   05/14/16 1207  Full code   Continuous     05/14/16 1206    Code Status History    Date Active Date Inactive Code Status Order ID Comments User Context   This patient has a current code status but no historical code status.    Advance Directive Documentation        Most Recent Value   Type of Advance Directive  Living will, Healthcare Power of Attorney   Pre-existing out of facility DNR order (yellow form  or pink MOST form)     "  MOST" Form in Place?        TOTAL TIME TAKING CARE OF THIS PATIENT: 37 minutes.    Gladstone Lighter M.D on 05/16/2016 at 8:48 AM  Between 7am to 6pm - Pager - [phone]  After 6pm go to [email or web address] - password [email or web address] [facility]  Office  [phone]  CC: Primary care physician; Idelle Crouch, MD

## 2016-05-16 NOTE — Progress Notes (Signed)
Pt stable. IV removed. D/c instructions given and education provided. Prescriptions verified and sent to pharmacy. Pt states she understands instructions. Pt getting dressed and will be escorted out by staff and driven home by family.

## 2016-05-26 DIAGNOSIS — N179 Acute kidney failure, unspecified: Secondary | ICD-10-CM | POA: Diagnosis not present

## 2016-05-26 DIAGNOSIS — K219 Gastro-esophageal reflux disease without esophagitis: Secondary | ICD-10-CM | POA: Diagnosis not present

## 2016-05-26 DIAGNOSIS — K529 Noninfective gastroenteritis and colitis, unspecified: Secondary | ICD-10-CM | POA: Diagnosis not present

## 2016-05-26 DIAGNOSIS — Z79899 Other long term (current) drug therapy: Secondary | ICD-10-CM | POA: Diagnosis not present

## 2016-06-07 DIAGNOSIS — H2513 Age-related nuclear cataract, bilateral: Secondary | ICD-10-CM | POA: Diagnosis not present

## 2016-06-15 ENCOUNTER — Encounter: Payer: Self-pay | Admitting: Obstetrics and Gynecology

## 2016-06-15 ENCOUNTER — Ambulatory Visit (INDEPENDENT_AMBULATORY_CARE_PROVIDER_SITE_OTHER): Payer: Commercial Managed Care - HMO | Admitting: Obstetrics and Gynecology

## 2016-06-15 VITALS — BP 139/73 | HR 78 | Ht 63.0 in | Wt 118.7 lb

## 2016-06-15 DIAGNOSIS — N8110 Cystocele, unspecified: Secondary | ICD-10-CM | POA: Diagnosis not present

## 2016-06-15 DIAGNOSIS — N3289 Other specified disorders of bladder: Secondary | ICD-10-CM

## 2016-06-15 DIAGNOSIS — R300 Dysuria: Secondary | ICD-10-CM | POA: Diagnosis not present

## 2016-06-15 DIAGNOSIS — IMO0002 Reserved for concepts with insufficient information to code with codable children: Secondary | ICD-10-CM | POA: Insufficient documentation

## 2016-06-15 DIAGNOSIS — N952 Postmenopausal atrophic vaginitis: Secondary | ICD-10-CM | POA: Diagnosis not present

## 2016-06-15 LAB — POCT URINALYSIS DIPSTICK
Bilirubin, UA: NEGATIVE
Blood, UA: NEGATIVE
Glucose, UA: NEGATIVE
Ketones, UA: NEGATIVE
LEUKOCYTES UA: NEGATIVE
Nitrite, UA: NEGATIVE
PROTEIN UA: NEGATIVE
Spec Grav, UA: <=1.005
UROBILINOGEN UA: 0.2
pH, UA: 7.5

## 2016-06-15 MED ORDER — ESTRADIOL 0.1 MG/GM VA CREA: TOPICAL_CREAM | VAGINAL | 12 refills | Status: DC

## 2016-06-15 NOTE — Progress Notes (Signed)
GYN ENCOUNTER NOTE  Subjective:       Mary Thornton is a 75 y.o. No obstetric history on file. female is here for gynecologic evaluation of the following issues:  1. Pelvic organ prolapse  75 year old widowed white female para 85, status post TAH approximately 35 years ago (unsure if ovaries were removed), not on estrogen replacement therapy, now history of estrogen replacement therapy, presents for evaluation of pelvic organ prolapse.  GU history: Urinary frequency-8 per day Nocturia-4 per night No SUI History of urge Patient has extensive pressure like things are falling out; has low backache; reports generalized hurting and burning in the pelvis  GI history: Bowel movements frequency-every other day No history of chronic constipation No history of splinting with bowel movements  Patient is not sexually active; husband is deceased 35 years at age 66 from [facility].     Gynecologic History No LMP recorded. Patient is postmenopausal. Contraception: status post hysterectomy  Obstetric History OB History  No data available    Past Medical History:  Diagnosis Date  . Arthritis   . Coronary artery disease   . Environmental allergies   . Fibrocystic breast disease   . Hyperlipemia   . Hypertension   . PSVT (paroxysmal supraventricular tachycardia) ([place])   . Reactive airway disease   . Recurrent sinusitis   . Torticollis     Past Surgical History:  Procedure Laterality Date  . ABDOMINAL HYSTERECTOMY    . BREAST BIOPSY Left    neg  . COLONOSCOPY WITH PROPOFOL N/A 06/23/2015   Procedure: COLONOSCOPY WITH PROPOFOL;  Surgeon: [name], MD;  Location: [facility] ENDOSCOPY;  Service: Endoscopy;  Laterality: N/A;  . ESOPHAGOGASTRODUODENOSCOPY (EGD) WITH PROPOFOL N/A 06/23/2015   Procedure: ESOPHAGOGASTRODUODENOSCOPY (EGD) WITH PROPOFOL;  Surgeon: [name], MD;  Location: [facility] ENDOSCOPY;  Service: Endoscopy;  Laterality: N/A;  . JOINT REPLACEMENT    . KNEE  ARTHROSCOPY    . Torticollis surgery x 2      Current Outpatient Prescriptions on File Prior to Visit  Medication Sig Dispense Refill  . acetaminophen (TYLENOL) 325 MG tablet Take 325-650 mg by mouth every 6 (six) hours as needed for mild pain or moderate pain.     Marland Kitchen acidophilus (RISAQUAD) CAPS capsule Take 1 capsule by mouth 2 (two) times daily. 14 capsule 0  . betamethasone dipropionate (DIPROLENE) 0.05 % cream Apply topically 2 (two) times daily.    . calcium carbonate (OS-CAL) 600 MG TABS tablet Take 600 mg by mouth daily.     Marland Kitchen CARTIA XT 180 MG 24 hr capsule Take 180 mg by mouth 2 (two) times daily.    . cyclobenzaprine (FLEXERIL) 5 MG tablet Take 5 mg by mouth 3 (three) times daily as needed for muscle spasms.    . diclofenac (VOLTAREN) 75 MG EC tablet Take 75 mg by mouth 2 (two) times daily.    Marland Kitchen loratadine (CLARITIN) 10 MG tablet Take 10 mg by mouth daily.    . metroNIDAZOLE (FLAGYL) 500 MG tablet Take 1 tablet (500 mg total) by mouth 3 (three) times daily. X 5 more days 15 tablet 0  . montelukast (SINGULAIR) 10 MG tablet Take 10 mg by mouth at bedtime.    . Multiple Vitamin (MULTIVITAMIN) tablet Take 1 tablet by mouth daily.    . ramipril (ALTACE) 5 MG capsule Take 5 mg by mouth daily.     No current facility-administered medications on file prior to visit.     Allergies  Allergen Reactions  .  Penicillins Itching and Rash    Has patient had a PCN reaction causing immediate rash, facial/tongue/throat swelling, SOB or lightheadedness with hypotension: Yes Has patient had a PCN reaction causing severe rash involving mucus membranes or skin necrosis: No Has patient had a PCN reaction that required hospitalization No Has patient had a PCN reaction occurring within the last 10 years: No If all of the above answers are "NO", then may proceed with Cephalosporin use.  . Sulfa Antibiotics Rash    Social History   Social History  . Marital status: Widowed    Spouse name: N/A  .  Number of children: N/A  . Years of education: N/A   Occupational History  . Not on file.   Social History Main Topics  . Smoking status: Never Smoker  . Smokeless tobacco: Not on file  . Alcohol use No  . Drug use: Unknown  . Sexual activity: Not on file   Other Topics Concern  . Not on file   Social History Narrative  . No narrative on file    No family history on file.  The following portions of the patient's history were reviewed and updated as appropriate: allergies, current medications, past family history, past medical history, past social history, past surgical history and problem list.  Review of Systems Per history of present illness  Objective:   BP 139/73   Pulse 78   Ht 5\' 3"  (1.6 m)   Wt 118 lb 11.2 oz (53.8 kg)   BMI 21.03 kg/m  CONSTITUTIONAL: Well-developed, well-nourished female in no acute distress.  HENT:  Normocephalic, atraumatic.  NECK: Not examined SKIN: Skin is warm and dry. No rash noted. Not diaphoretic. No erythema. No pallor. [place]: Alert and oriented to person, place, and time. PSYCHIATRIC: Normal mood and affect. Normal behavior. Normal judgment and thought content. CARDIOVASCULAR:Not Examined RESPIRATORY: Not Examined BREASTS: Not Examined ABDOMEN: Soft, non distended; Non tender.  No Organomegaly. No hernias PELVIC:  External Genitalia: Mild atrophy  BUS: Urethral caruncle present  Vagina: Moderate to severe atrophy; first-degree cystocele; good support at the urethrovesical junction without rotational descent of the bladder neck with Valsalva; no rectocele; no enterocele  Cervix: Surgically absent  Uterus: Surgically absent  Adnexa: Normal  RV: Normal external exam; normal sphincter tone; no rectal masses  Bladder: Nontender MUSCULOSKELETAL: Normal range of motion. No tenderness.  No cyanosis, clubbing, or edema.     Assessment:   1. First-degree cystocele 2. Vaginal atrophy 3. Unstable bladder.    Plan:   1.  Estrace cream 1 g intravaginal twice a week 2. Return in 3 months for reassessment 3. We'll consider anticholinergic medication for unstable bladder symptoms persist despite the vaginal estrogen cream therapy in 3 months  A total of 30 minutes were spent face-to-face with the patient during the encounter with greater than 50% dealing with counseling and coordination of care.  Brayton Mars, MD  Note: This dictation was prepared with Dragon dictation along with smaller phrase technology. Any transcriptional errors that result from this process are unintentional.

## 2016-06-15 NOTE — Patient Instructions (Addendum)
1. Insert Estrace cream intravaginal 2 times a week 2. Return in 3 months for follow-up

## 2016-06-17 LAB — URINE CULTURE

## 2016-08-17 DIAGNOSIS — Z79899 Other long term (current) drug therapy: Secondary | ICD-10-CM | POA: Diagnosis not present

## 2016-08-17 DIAGNOSIS — E7800 Pure hypercholesterolemia, unspecified: Secondary | ICD-10-CM | POA: Diagnosis not present

## 2016-08-24 ENCOUNTER — Other Ambulatory Visit: Payer: Self-pay | Admitting: Internal Medicine

## 2016-08-24 DIAGNOSIS — R0609 Other forms of dyspnea: Secondary | ICD-10-CM | POA: Diagnosis not present

## 2016-08-24 DIAGNOSIS — Z1329 Encounter for screening for other suspected endocrine disorder: Secondary | ICD-10-CM | POA: Diagnosis not present

## 2016-08-24 DIAGNOSIS — R6889 Other general symptoms and signs: Secondary | ICD-10-CM

## 2016-08-24 DIAGNOSIS — R918 Other nonspecific abnormal finding of lung field: Secondary | ICD-10-CM | POA: Diagnosis not present

## 2016-08-24 DIAGNOSIS — R5383 Other fatigue: Secondary | ICD-10-CM | POA: Diagnosis not present

## 2016-08-24 DIAGNOSIS — R5381 Other malaise: Secondary | ICD-10-CM | POA: Diagnosis not present

## 2016-08-24 DIAGNOSIS — K529 Noninfective gastroenteritis and colitis, unspecified: Secondary | ICD-10-CM

## 2016-08-24 DIAGNOSIS — I10 Essential (primary) hypertension: Secondary | ICD-10-CM | POA: Diagnosis not present

## 2016-08-24 DIAGNOSIS — E7800 Pure hypercholesterolemia, unspecified: Secondary | ICD-10-CM | POA: Diagnosis not present

## 2016-08-24 DIAGNOSIS — M436 Torticollis: Secondary | ICD-10-CM | POA: Diagnosis not present

## 2016-08-24 DIAGNOSIS — Z0000 Encounter for general adult medical examination without abnormal findings: Secondary | ICD-10-CM | POA: Diagnosis not present

## 2016-08-24 DIAGNOSIS — Z23 Encounter for immunization: Secondary | ICD-10-CM | POA: Diagnosis not present

## 2016-08-24 DIAGNOSIS — Z1231 Encounter for screening mammogram for malignant neoplasm of breast: Secondary | ICD-10-CM

## 2016-08-31 DIAGNOSIS — R0609 Other forms of dyspnea: Secondary | ICD-10-CM | POA: Diagnosis not present

## 2016-09-01 ENCOUNTER — Ambulatory Visit
Admission: RE | Admit: 2016-09-01 | Discharge: 2016-09-01 | Disposition: A | Discharge status: Home / Self Care | Payer: Commercial Managed Care - HMO | Source: Ambulatory Visit | Attending: Internal Medicine | Admitting: Internal Medicine

## 2016-09-01 DIAGNOSIS — I700 Atherosclerosis of aorta: Secondary | ICD-10-CM | POA: Insufficient documentation

## 2016-09-01 DIAGNOSIS — N83202 Unspecified ovarian cyst, left side: Secondary | ICD-10-CM | POA: Insufficient documentation

## 2016-09-01 DIAGNOSIS — I2510 Atherosclerotic heart disease of native coronary artery without angina pectoris: Secondary | ICD-10-CM | POA: Diagnosis not present

## 2016-09-01 DIAGNOSIS — R6889 Other general symptoms and signs: Secondary | ICD-10-CM | POA: Insufficient documentation

## 2016-09-01 DIAGNOSIS — I708 Atherosclerosis of other arteries: Secondary | ICD-10-CM | POA: Diagnosis not present

## 2016-09-01 DIAGNOSIS — M48061 Spinal stenosis, lumbar region without neurogenic claudication: Secondary | ICD-10-CM | POA: Diagnosis not present

## 2016-09-01 DIAGNOSIS — K529 Noninfective gastroenteritis and colitis, unspecified: Secondary | ICD-10-CM | POA: Insufficient documentation

## 2016-09-01 DIAGNOSIS — R109 Unspecified abdominal pain: Secondary | ICD-10-CM | POA: Diagnosis not present

## 2016-09-01 HISTORY — DX: Unspecified asthma, uncomplicated: J45.909

## 2016-09-01 MED ORDER — IOPAMIDOL (ISOVUE-300) INJECTION 61%
85.0000 mL | Freq: Once | INTRAVENOUS | Status: AC | PRN
  Administered 2016-09-01: 85 mL via INTRAVENOUS

## 2016-09-02 ENCOUNTER — Other Ambulatory Visit: Payer: Self-pay | Admitting: Internal Medicine

## 2016-09-15 ENCOUNTER — Ambulatory Visit (INDEPENDENT_AMBULATORY_CARE_PROVIDER_SITE_OTHER): Payer: Commercial Managed Care - HMO | Admitting: Obstetrics and Gynecology

## 2016-09-15 ENCOUNTER — Encounter: Payer: Self-pay | Admitting: Obstetrics and Gynecology

## 2016-09-15 VITALS — BP 166/77 | HR 63 | Ht 63.0 in | Wt 119.8 lb

## 2016-09-15 DIAGNOSIS — N8111 Cystocele, midline: Secondary | ICD-10-CM

## 2016-09-15 DIAGNOSIS — N3289 Other specified disorders of bladder: Secondary | ICD-10-CM

## 2016-09-15 DIAGNOSIS — N952 Postmenopausal atrophic vaginitis: Secondary | ICD-10-CM | POA: Diagnosis not present

## 2016-09-15 DIAGNOSIS — R300 Dysuria: Secondary | ICD-10-CM

## 2016-09-15 LAB — POCT URINALYSIS DIPSTICK
BILIRUBIN UA: NEGATIVE
Blood, UA: NEGATIVE
GLUCOSE UA: NEGATIVE
KETONES UA: NEGATIVE
LEUKOCYTES UA: NEGATIVE
Nitrite, UA: NEGATIVE
PROTEIN UA: NEGATIVE
Urobilinogen, UA: NEGATIVE
pH, UA: 7.5

## 2016-09-15 MED ORDER — MIRABEGRON ER 25 MG PO TB24: 25.0000 mg | ORAL_TABLET | Freq: Every day | ORAL | 6 refills | Status: DC

## 2016-09-15 NOTE — Progress Notes (Signed)
Chief complaint: 1. Unstable bladder 2. Cystocele 3. Vaginal atrophy  Patient presents for 3 month follow-up. She was started on Estrace cream intravaginal twice a week. She states that her urinary frequency and urgency has not diminished significantly. She does occasionally feel a bulge at the introitus when she has prolonged standing during the day. This does received at night. The patient has a first to second-degree cystocele on previous clinical exam  Past medical history, past surgical history, problem list, medications, and allergies are reviewed  OBJECTIVE: BP (!) 166/77   Pulse 63   Ht 5\' 3"  (1.6 m)   Wt 119 lb 12.8 oz (54.3 kg)   BMI 21.22 kg/m  PELVIC:             External Genitalia: Minimal atrophy (improved from last visit)             BUS: Urethral caruncle present             Vagina: Moderate to severe atrophy; first-degree cystocele; good support at the urethrovesical junction without rotational descent of the bladder neck with Valsalva; no rectocele; no enterocele             Cervix: Surgically absent             Uterus: Surgically absent             Adnexa: Normal             RV: Normal external exam; normal sphincter tone; no rectal masses             Bladder: Nontender   ASSESSMENT: 1. Unstable bladder, without improvement following estrogen therapy intravaginal 2. First to second-degree cystocele 3. Vaginal atrophy, improved with estrogen therapy  PLAN: 1. Continue Estrace cream intravaginal twice a week 2. Start Merbetriq 25 mg daily 3. Return in 6 weeks for follow-up  A total of 15 minutes were spent face-to-face with the patient during this encounter and over half of that time dealt with counseling and coordination of care.  Brayton Mars, MD  Note: This dictation was prepared with Dragon dictation along with smaller phrase technology. Any transcriptional errors that result from this process are unintentional.

## 2016-09-15 NOTE — Patient Instructions (Signed)
1. Continue with Estrace cream intravaginal twice a week 2. Start Merbetriq 25 mg a day 3. Return in 6 weeks for follow-up

## 2016-09-16 LAB — URINE CULTURE: Organism ID, Bacteria: NO GROWTH

## 2016-09-29 ENCOUNTER — Ambulatory Visit
Admission: RE | Admit: 2016-09-29 | Discharge: 2016-09-29 | Disposition: A | Discharge status: Home / Self Care | Payer: Commercial Managed Care - HMO | Source: Ambulatory Visit | Attending: Internal Medicine | Admitting: Internal Medicine

## 2016-09-29 DIAGNOSIS — Z1231 Encounter for screening mammogram for malignant neoplasm of breast: Secondary | ICD-10-CM

## 2016-10-03 ENCOUNTER — Telehealth: Payer: Self-pay | Admitting: Obstetrics and Gynecology

## 2016-10-03 NOTE — Telephone Encounter (Signed)
Pt aware of u/c results.

## 2016-10-03 NOTE — Telephone Encounter (Signed)
Pt is returning your call

## 2016-11-01 ENCOUNTER — Encounter: Payer: Commercial Managed Care - HMO | Admitting: Obstetrics and Gynecology

## 2016-11-03 ENCOUNTER — Other Ambulatory Visit: Payer: Self-pay | Admitting: Internal Medicine

## 2016-11-03 DIAGNOSIS — R911 Solitary pulmonary nodule: Secondary | ICD-10-CM

## 2016-11-15 ENCOUNTER — Ambulatory Visit: Admission: RE | Admit: 2016-11-15 | Payer: Commercial Managed Care - HMO | Source: Ambulatory Visit

## 2016-11-18 ENCOUNTER — Ambulatory Visit: Admission: RE | Admit: 2016-11-18 | Payer: Medicare HMO | Source: Ambulatory Visit

## 2016-11-22 ENCOUNTER — Ambulatory Visit
Admission: RE | Admit: 2016-11-22 | Discharge: 2016-11-22 | Disposition: A | Discharge status: Home / Self Care | Payer: Medicare HMO | Source: Ambulatory Visit | Attending: Internal Medicine | Admitting: Internal Medicine

## 2016-11-22 DIAGNOSIS — R918 Other nonspecific abnormal finding of lung field: Secondary | ICD-10-CM | POA: Diagnosis not present

## 2016-11-22 DIAGNOSIS — M5134 Other intervertebral disc degeneration, thoracic region: Secondary | ICD-10-CM | POA: Insufficient documentation

## 2016-11-22 DIAGNOSIS — R911 Solitary pulmonary nodule: Secondary | ICD-10-CM | POA: Diagnosis not present

## 2016-11-22 DIAGNOSIS — J438 Other emphysema: Secondary | ICD-10-CM | POA: Diagnosis not present

## 2016-11-23 ENCOUNTER — Other Ambulatory Visit: Payer: Self-pay | Admitting: Internal Medicine

## 2016-11-23 DIAGNOSIS — R918 Other nonspecific abnormal finding of lung field: Secondary | ICD-10-CM

## 2016-11-25 DIAGNOSIS — J45998 Other asthma: Secondary | ICD-10-CM | POA: Diagnosis not present

## 2016-11-25 DIAGNOSIS — I471 Supraventricular tachycardia: Secondary | ICD-10-CM | POA: Diagnosis not present

## 2016-11-25 DIAGNOSIS — I10 Essential (primary) hypertension: Secondary | ICD-10-CM | POA: Diagnosis not present

## 2016-11-25 DIAGNOSIS — E7800 Pure hypercholesterolemia, unspecified: Secondary | ICD-10-CM | POA: Diagnosis not present

## 2016-11-25 DIAGNOSIS — R911 Solitary pulmonary nodule: Secondary | ICD-10-CM | POA: Diagnosis not present

## 2016-11-25 DIAGNOSIS — M436 Torticollis: Secondary | ICD-10-CM | POA: Diagnosis not present

## 2016-12-02 ENCOUNTER — Encounter
Admission: RE | Admit: 2016-12-02 | Discharge: 2016-12-02 | Disposition: A | Discharge status: Home / Self Care | Payer: Medicare HMO | Source: Ambulatory Visit | Attending: Internal Medicine | Admitting: Internal Medicine

## 2016-12-02 DIAGNOSIS — R911 Solitary pulmonary nodule: Secondary | ICD-10-CM | POA: Diagnosis not present

## 2016-12-02 DIAGNOSIS — R918 Other nonspecific abnormal finding of lung field: Secondary | ICD-10-CM | POA: Insufficient documentation

## 2016-12-02 LAB — GLUCOSE, CAPILLARY: GLUCOSE-CAPILLARY: 85 mg/dL (ref 65–99)

## 2016-12-02 MED ORDER — FLUDEOXYGLUCOSE F - 18 (FDG) INJECTION
12.2400 | Freq: Once | INTRAVENOUS | Status: AC | PRN
  Administered 2016-12-02: 12.24 via INTRAVENOUS

## 2016-12-09 ENCOUNTER — Ambulatory Visit: Payer: Commercial Managed Care - HMO | Admitting: Cardiothoracic Surgery

## 2016-12-16 DIAGNOSIS — H401130 Primary open-angle glaucoma, bilateral, stage unspecified: Secondary | ICD-10-CM | POA: Diagnosis not present

## 2016-12-16 DIAGNOSIS — H353131 Nonexudative age-related macular degeneration, bilateral, early dry stage: Secondary | ICD-10-CM | POA: Diagnosis not present

## 2017-02-28 DIAGNOSIS — I10 Essential (primary) hypertension: Secondary | ICD-10-CM | POA: Diagnosis not present

## 2017-02-28 DIAGNOSIS — Z79899 Other long term (current) drug therapy: Secondary | ICD-10-CM | POA: Diagnosis not present

## 2017-02-28 DIAGNOSIS — Z0000 Encounter for general adult medical examination without abnormal findings: Secondary | ICD-10-CM | POA: Diagnosis not present

## 2017-02-28 DIAGNOSIS — I471 Supraventricular tachycardia: Secondary | ICD-10-CM | POA: Diagnosis not present

## 2017-02-28 DIAGNOSIS — E7800 Pure hypercholesterolemia, unspecified: Secondary | ICD-10-CM | POA: Diagnosis not present

## 2017-03-13 DIAGNOSIS — M19032 Primary osteoarthritis, left wrist: Secondary | ICD-10-CM | POA: Diagnosis not present

## 2017-03-13 DIAGNOSIS — S63642A Sprain of metacarpophalangeal joint of left thumb, initial encounter: Secondary | ICD-10-CM | POA: Diagnosis not present

## 2017-03-13 DIAGNOSIS — M79645 Pain in left finger(s): Secondary | ICD-10-CM | POA: Diagnosis not present

## 2017-05-29 DIAGNOSIS — M79605 Pain in left leg: Secondary | ICD-10-CM | POA: Diagnosis not present

## 2017-06-05 DIAGNOSIS — M542 Cervicalgia: Secondary | ICD-10-CM | POA: Diagnosis not present

## 2017-06-12 ENCOUNTER — Emergency Department: Payer: Medicare HMO

## 2017-06-12 ENCOUNTER — Emergency Department
Admission: EM | Admit: 2017-06-12 | Discharge: 2017-06-12 | Disposition: A | Discharge status: Home / Self Care | Payer: Medicare HMO | Attending: Emergency Medicine | Admitting: Emergency Medicine

## 2017-06-12 ENCOUNTER — Encounter: Payer: Self-pay | Admitting: Emergency Medicine

## 2017-06-12 DIAGNOSIS — I471 Supraventricular tachycardia: Secondary | ICD-10-CM | POA: Insufficient documentation

## 2017-06-12 DIAGNOSIS — J45909 Unspecified asthma, uncomplicated: Secondary | ICD-10-CM | POA: Insufficient documentation

## 2017-06-12 DIAGNOSIS — R51 Headache: Secondary | ICD-10-CM | POA: Diagnosis not present

## 2017-06-12 DIAGNOSIS — Z79899 Other long term (current) drug therapy: Secondary | ICD-10-CM | POA: Insufficient documentation

## 2017-06-12 DIAGNOSIS — I2510 Atherosclerotic heart disease of native coronary artery without angina pectoris: Secondary | ICD-10-CM | POA: Diagnosis not present

## 2017-06-12 DIAGNOSIS — I10 Essential (primary) hypertension: Secondary | ICD-10-CM | POA: Insufficient documentation

## 2017-06-12 DIAGNOSIS — E785 Hyperlipidemia, unspecified: Secondary | ICD-10-CM | POA: Diagnosis not present

## 2017-06-12 DIAGNOSIS — M542 Cervicalgia: Secondary | ICD-10-CM | POA: Diagnosis not present

## 2017-06-12 LAB — CBC WITH DIFFERENTIAL/PLATELET
Basophils Absolute: 0 10*3/uL (ref 0–0.1)
Basophils Relative: 1 %
EOS PCT: 3 %
Eosinophils Absolute: 0.2 10*3/uL (ref 0–0.7)
HCT: 37.7 % (ref 35.0–47.0)
Hemoglobin: 12.8 g/dL (ref 12.0–16.0)
LYMPHS ABS: 1.5 10*3/uL (ref 1.0–3.6)
LYMPHS PCT: 19 %
MCH: 30.6 pg (ref 26.0–34.0)
MCHC: 33.9 g/dL (ref 32.0–36.0)
MCV: 90.1 fL (ref 80.0–100.0)
MONO ABS: 0.6 10*3/uL (ref 0.2–0.9)
Monocytes Relative: 8 %
Neutro Abs: 5.4 10*3/uL (ref 1.4–6.5)
Neutrophils Relative %: 69 %
PLATELETS: 322 10*3/uL (ref 150–440)
RBC: 4.18 MIL/uL (ref 3.80–5.20)
RDW: 13.7 % (ref 11.5–14.5)
WBC: 7.7 10*3/uL (ref 3.6–11.0)

## 2017-06-12 LAB — COMPREHENSIVE METABOLIC PANEL
ALT: 20 U/L (ref 14–54)
AST: 23 U/L (ref 15–41)
Albumin: 4.2 g/dL (ref 3.5–5.0)
Alkaline Phosphatase: 58 U/L (ref 38–126)
Anion gap: 9 (ref 5–15)
BILIRUBIN TOTAL: 0.6 mg/dL (ref 0.3–1.2)
BUN: 13 mg/dL (ref 6–20)
CALCIUM: 8.8 mg/dL — AB (ref 8.9–10.3)
CO2: 27 mmol/L (ref 22–32)
CREATININE: 0.46 mg/dL (ref 0.44–1.00)
Chloride: 104 mmol/L (ref 101–111)
GFR calc Af Amer: >60 mL/min (ref >60)
Glucose, Bld: 100 mg/dL — ABNORMAL HIGH (ref 65–99)
Potassium: 4.2 mmol/L (ref 3.5–5.1)
SODIUM: 140 mmol/L (ref 135–145)
TOTAL PROTEIN: 6.8 g/dL (ref 6.5–8.1)

## 2017-06-12 LAB — SEDIMENTATION RATE: SED RATE: 6 mm/h (ref 0–30)

## 2017-06-12 MED ORDER — ONDANSETRON HCL 4 MG/2ML IJ SOLN: 4.0000 mg | Freq: Once | INTRAMUSCULAR | Status: DC

## 2017-06-12 NOTE — ED Notes (Signed)
Pt resting in bed, TV turned on, pt in no distress

## 2017-06-12 NOTE — ED Triage Notes (Signed)
Says she has right side facial pain starts in eye and is in ear and down to neck.  Says she went to [facility] Monday and was started on steroids and muscle relaxer with no rileif.

## 2017-06-12 NOTE — ED Provider Notes (Signed)
[facility] Emergency Department Provider Note ____________________________________________   First MD Initiated Contact with Patient 06/12/17 1213     (approximate)  I have reviewed the triage vital signs and the nursing notes.   HISTORY  Chief Complaint Facial Pain    HPI Mary Thornton is a 76 y.o. female Who presents with right-sided neck and facial pain for approximately one week, persistent course, constant, and not relieved by tizanidine and prednisone prescribed from urgent care. Patient states that she had an episode of torticollis 2 years ago that felt similar.she denies any acute trauma. She denies weakness or numbness, or any swelling to the neck or face. Patient reports that the pain is primarily in the neck and radiating to the side of the head, and she denies pain in other parts of her head.  Past Medical History:  Diagnosis Date  . Arthritis   . Asthma   . Coronary artery disease   . Environmental allergies   . Fibrocystic breast disease   . Hyperlipemia   . Hypertension   . PSVT (paroxysmal supraventricular tachycardia) ([place])   . Reactive airway disease   . Recurrent sinusitis   . Torticollis     Patient Active Problem List   Diagnosis Date Noted  . Cystocele 06/15/2016  . Vaginal atrophy 06/15/2016  . Unstable bladder 06/15/2016  . ARF (acute renal failure) ([place]) 05/14/2016    Past Surgical History:  Procedure Laterality Date  . ABDOMINAL HYSTERECTOMY    . BREAST BIOPSY Left    neg  . COLONOSCOPY WITH PROPOFOL N/A 06/23/2015   Procedure: COLONOSCOPY WITH PROPOFOL;  Surgeon: [name], MD;  Location: [facility] ENDOSCOPY;  Service: Endoscopy;  Laterality: N/A;  . ESOPHAGOGASTRODUODENOSCOPY (EGD) WITH PROPOFOL N/A 06/23/2015   Procedure: ESOPHAGOGASTRODUODENOSCOPY (EGD) WITH PROPOFOL;  Surgeon: [name], MD;  Location: [facility] ENDOSCOPY;  Service: Endoscopy;  Laterality: N/A;  . JOINT REPLACEMENT    . KNEE  ARTHROSCOPY    . Torticollis surgery x 2      Prior to Admission medications   Medication Sig Start Date End Date Taking? Authorizing Provider  acetaminophen (TYLENOL) 325 MG tablet Take 325-650 mg by mouth every 6 (six) hours as needed for mild pain or moderate pain.    Yes [provider]  calcium carbonate (OS-CAL) 600 MG TABS tablet Take 600 mg by mouth daily.    Yes [provider]  CARTIA XT 180 MG 24 hr capsule Take 180 mg by mouth 2 (two) times daily. 03/03/16  Yes [provider]  cyclobenzaprine (FLEXERIL) 5 MG tablet Take 5 mg by mouth 3 (three) times daily as needed for muscle spasms.   Yes [provider]  diclofenac (VOLTAREN) 75 MG EC tablet Take 75 mg by mouth 2 (two) times daily.   Yes [provider]  loratadine (CLARITIN) 10 MG tablet Take 10 mg by mouth daily.   Yes [provider]  montelukast (SINGULAIR) 10 MG tablet Take 10 mg by mouth at bedtime.   Yes [provider]  Multiple Vitamin (MULTIVITAMIN) tablet Take 1 tablet by mouth daily.   Yes [provider]  ramipril (ALTACE) 5 MG capsule Take 5 mg by mouth daily.   Yes [provider]  estradiol (ESTRACE) 0.1 MG/GM vaginal cream Apply 1 gram per vagina two times a week Patient not taking: Reported on 06/12/2017 06/15/16   Defrancesco, Alanda Slim, MD  mirabegron ER (MYRBETRIQ) 25 MG TB24 tablet Take 1 tablet (25 mg  total) by mouth daily. Patient not taking: Reported on 06/12/2017 09/15/16   Defrancesco, Alanda Slim, MD  predniSONE (DELTASONE) 10 MG tablet 6 pills day one in divided doses, 5 pills day 2, 4 pills day 3, etc. Follow package instructions. 06/05/17   [provider]    Allergies Penicillins and Sulfa antibiotics  Family History  Problem Relation Age of Onset  . Heart disease Mother   . Heart disease Sister   . Cancer Neg Hx   . Diabetes Neg Hx   . Breast cancer Neg Hx     Social History Social History  Substance Use  Topics  . Smoking status: Never Smoker  . Smokeless tobacco: Never Used  . Alcohol use No    Review of Systems Constitutional: No fever/chills Eyes: No visual changes. ENT: No sore throat. Positive for neck pain. Cardiovascular: Denies chest pain. Respiratory: Denies shortness of breath. Gastrointestinal: No nausea, no vomiting.  No diarrhea.  Genitourinary: Negative for dysuria.  Musculoskeletal: Negative for back pain. Skin: Negative for rash. Neurological: Positive for headache, negative forfocal weakness or numbness.   ____________________________________________   PHYSICAL EXAM:  VITAL SIGNS: ED Triage Vitals  Enc Vitals Group     BP 06/12/17 1111 (!) 145/72     Pulse Rate 06/12/17 1111 74     Resp 06/12/17 1111 16     Temp 06/12/17 1111 98.3 F (36.8 C)     Temp Source 06/12/17 1111 Oral     SpO2 06/12/17 1111 95 %     Weight 06/12/17 1111 118 lb (53.5 kg)     Height 06/12/17 1111 5' 3"  (1.6 m)     Head Circumference --      Peak Flow --      Pain Score 06/12/17 1110 7     Pain Loc --      Pain Edu? --      Excl. in [place]? --     Constitutional: Alert and oriented. Well appearing and in no acute distress. Eyes: Conjunctivae are normal. EOMI, PERRL.   Head: Atraumatic. No temporal tenderness. Nose: No congestion/rhinnorhea. Mouth/Throat: Mucous membranes are moist.   Neck: Normal range of motion. Mild tenderness to muscles of the right side of neck. No cervical spine tenderness. No lymphadenopathy. No masses or abnormal swelling. Cardiovascular: Normal rate, regular rhythm. Grossly normal heart sounds.  Good peripheral circulation. Respiratory: Normal respiratory effort.  No retractions. Lungs CTAB. Gastrointestinal: No distention.  Genitourinary: No CVA tenderness. Musculoskeletal:  Extremities warm and well perfused.  Neurologic:  Normal speech and language. No gross focal neurologic deficits are appreciated. 5 out of 5 motor strength and normal sensation in  all extremities. Skin:  Skin is warm and dry. No rash noted. Psychiatric: Mood and affect are normal. Speech and behavior are normal.  ____________________________________________   LABS (all labs ordered are listed, but only abnormal results are displayed)  Labs Reviewed  COMPREHENSIVE METABOLIC PANEL - Abnormal; Notable for the following:       Result Value   Glucose, Bld 100 (*)    Calcium 8.8 (*)    All other components within normal limits  CBC WITH DIFFERENTIAL/PLATELET  SEDIMENTATION RATE   ____________________________________________  EKG   ____________________________________________  RADIOLOGY    ____________________________________________   PROCEDURES  Procedure(s) performed: No    Critical Care performed: No ____________________________________________   INITIAL IMPRESSION / ASSESSMENT AND PLAN / ED COURSE  Pertinent labs & imaging results that were available during my care of the patient were  reviewed by me and considered in my medical decision making (see chart for details).  76 y/o female presents with approximately 1 week of right-sided neck pain radiating to the right side of her face and head similar to an episode of torticollis that she experienced years ago.no acute trauma. No neurological symptoms. Patient states that she was started on a muscle relaxant and prednisone by urgent care 1 week ago without relief. On exam vital signs are normal except for hypertension, and there is mild tenderness to the right side of the neck but no other acute findings. Overall suspect muscle strain or spasm, mild torticollis, or other benign cause. Low suspicion for temporal arteritis given pain in the neck and no significant tenderness but given patient's age will obtain ESR. Also given patient's age with new-onset headache will obtain physical labs and CT head.  If negative workup anticipate discharge home.    ----------------------------------------- 2:45 PM  on 06/12/2017 -----------------------------------------  Negative CT and lab workup. Patient remains comfortable and has not required any analgesics in the emergency department. She feels well to go home. Patient will continue muscle relaxant and Tylenol as needed, and return precautions were given.  ____________________________________________   FINAL CLINICAL IMPRESSION(S) / ED DIAGNOSES  Final diagnoses:  Neck pain      NEW MEDICATIONS STARTED DURING THIS VISIT:  Discharge Medication List as of 06/12/2017  2:47 PM       Note:  This document was prepared using Dragon voice recognition software and may include unintentional dictation errors.    Arta Silence, MD 06/12/17 [phone]

## 2017-06-12 NOTE — ED Notes (Signed)
Pt states right sided facial pain and a "pulling" feeling, states she was given muscle relaxer's and steroids last week but states no relief, states hx of torticollis in the past, awake and alert in no acute distress

## 2017-06-16 DIAGNOSIS — H401130 Primary open-angle glaucoma, bilateral, stage unspecified: Secondary | ICD-10-CM | POA: Diagnosis not present

## 2017-06-16 DIAGNOSIS — H2513 Age-related nuclear cataract, bilateral: Secondary | ICD-10-CM | POA: Diagnosis not present

## 2017-06-16 DIAGNOSIS — H353131 Nonexudative age-related macular degeneration, bilateral, early dry stage: Secondary | ICD-10-CM | POA: Diagnosis not present

## 2017-06-29 DIAGNOSIS — K2270 Barrett's esophagus without dysplasia: Secondary | ICD-10-CM | POA: Diagnosis not present

## 2017-07-05 DIAGNOSIS — I10 Essential (primary) hypertension: Secondary | ICD-10-CM | POA: Diagnosis not present

## 2017-07-05 DIAGNOSIS — Z1211 Encounter for screening for malignant neoplasm of colon: Secondary | ICD-10-CM | POA: Diagnosis not present

## 2017-07-05 DIAGNOSIS — Z79899 Other long term (current) drug therapy: Secondary | ICD-10-CM | POA: Diagnosis not present

## 2017-07-05 DIAGNOSIS — E7800 Pure hypercholesterolemia, unspecified: Secondary | ICD-10-CM | POA: Diagnosis not present

## 2017-07-05 DIAGNOSIS — I471 Supraventricular tachycardia: Secondary | ICD-10-CM | POA: Diagnosis not present

## 2017-07-05 DIAGNOSIS — I2510 Atherosclerotic heart disease of native coronary artery without angina pectoris: Secondary | ICD-10-CM | POA: Diagnosis not present

## 2017-07-10 DIAGNOSIS — H2513 Age-related nuclear cataract, bilateral: Secondary | ICD-10-CM | POA: Diagnosis not present

## 2017-07-10 DIAGNOSIS — H401131 Primary open-angle glaucoma, bilateral, mild stage: Secondary | ICD-10-CM | POA: Diagnosis not present

## 2017-07-10 DIAGNOSIS — H353131 Nonexudative age-related macular degeneration, bilateral, early dry stage: Secondary | ICD-10-CM | POA: Diagnosis not present

## 2017-07-24 DIAGNOSIS — Z1211 Encounter for screening for malignant neoplasm of colon: Secondary | ICD-10-CM | POA: Diagnosis not present

## 2017-08-10 ENCOUNTER — Other Ambulatory Visit: Payer: Self-pay | Admitting: Internal Medicine

## 2017-08-10 DIAGNOSIS — Z1231 Encounter for screening mammogram for malignant neoplasm of breast: Secondary | ICD-10-CM

## 2017-10-02 ENCOUNTER — Ambulatory Visit
Admission: RE | Admit: 2017-10-02 | Discharge: 2017-10-02 | Disposition: A | Discharge status: Home / Self Care | Payer: Medicare HMO | Source: Ambulatory Visit | Attending: Internal Medicine | Admitting: Internal Medicine

## 2017-10-02 DIAGNOSIS — Z1231 Encounter for screening mammogram for malignant neoplasm of breast: Secondary | ICD-10-CM

## 2017-10-11 DIAGNOSIS — Z79899 Other long term (current) drug therapy: Secondary | ICD-10-CM | POA: Diagnosis not present

## 2017-10-11 DIAGNOSIS — I10 Essential (primary) hypertension: Secondary | ICD-10-CM | POA: Diagnosis not present

## 2017-10-11 DIAGNOSIS — E7800 Pure hypercholesterolemia, unspecified: Secondary | ICD-10-CM | POA: Diagnosis not present

## 2017-10-18 DIAGNOSIS — J4520 Mild intermittent asthma, uncomplicated: Secondary | ICD-10-CM | POA: Diagnosis not present

## 2017-10-18 DIAGNOSIS — Z79899 Other long term (current) drug therapy: Secondary | ICD-10-CM | POA: Diagnosis not present

## 2017-10-18 DIAGNOSIS — I471 Supraventricular tachycardia: Secondary | ICD-10-CM | POA: Diagnosis not present

## 2017-10-18 DIAGNOSIS — Z0000 Encounter for general adult medical examination without abnormal findings: Secondary | ICD-10-CM | POA: Diagnosis not present

## 2017-10-18 DIAGNOSIS — I10 Essential (primary) hypertension: Secondary | ICD-10-CM | POA: Diagnosis not present

## 2017-10-18 DIAGNOSIS — D649 Anemia, unspecified: Secondary | ICD-10-CM | POA: Diagnosis not present

## 2017-10-18 DIAGNOSIS — E7800 Pure hypercholesterolemia, unspecified: Secondary | ICD-10-CM | POA: Diagnosis not present

## 2017-10-30 ENCOUNTER — Emergency Department: Payer: Medicare HMO

## 2017-10-30 ENCOUNTER — Other Ambulatory Visit: Payer: Self-pay

## 2017-10-30 ENCOUNTER — Observation Stay
Admission: EM | Admit: 2017-10-30 | Discharge: 2017-10-31 | Disposition: A | Discharge status: Home / Self Care | Payer: Medicare HMO | Attending: Internal Medicine | Admitting: Internal Medicine

## 2017-10-30 ENCOUNTER — Encounter: Payer: Self-pay | Admitting: Emergency Medicine

## 2017-10-30 DIAGNOSIS — R079 Chest pain, unspecified: Secondary | ICD-10-CM | POA: Diagnosis present

## 2017-10-30 DIAGNOSIS — Z882 Allergy status to sulfonamides status: Secondary | ICD-10-CM | POA: Insufficient documentation

## 2017-10-30 DIAGNOSIS — I10 Essential (primary) hypertension: Secondary | ICD-10-CM | POA: Diagnosis not present

## 2017-10-30 DIAGNOSIS — E785 Hyperlipidemia, unspecified: Secondary | ICD-10-CM | POA: Insufficient documentation

## 2017-10-30 DIAGNOSIS — J45909 Unspecified asthma, uncomplicated: Secondary | ICD-10-CM | POA: Insufficient documentation

## 2017-10-30 DIAGNOSIS — I700 Atherosclerosis of aorta: Secondary | ICD-10-CM | POA: Diagnosis not present

## 2017-10-30 DIAGNOSIS — R0789 Other chest pain: Principal | ICD-10-CM | POA: Insufficient documentation

## 2017-10-30 DIAGNOSIS — I471 Supraventricular tachycardia: Secondary | ICD-10-CM | POA: Insufficient documentation

## 2017-10-30 DIAGNOSIS — M1990 Unspecified osteoarthritis, unspecified site: Secondary | ICD-10-CM | POA: Diagnosis not present

## 2017-10-30 DIAGNOSIS — E7800 Pure hypercholesterolemia, unspecified: Secondary | ICD-10-CM | POA: Diagnosis not present

## 2017-10-30 DIAGNOSIS — Z880 Allergy status to penicillin: Secondary | ICD-10-CM | POA: Diagnosis not present

## 2017-10-30 DIAGNOSIS — I2510 Atherosclerotic heart disease of native coronary artery without angina pectoris: Secondary | ICD-10-CM | POA: Diagnosis not present

## 2017-10-30 DIAGNOSIS — Z79899 Other long term (current) drug therapy: Secondary | ICD-10-CM | POA: Insufficient documentation

## 2017-10-30 DIAGNOSIS — Z7982 Long term (current) use of aspirin: Secondary | ICD-10-CM | POA: Insufficient documentation

## 2017-10-30 DIAGNOSIS — M436 Torticollis: Secondary | ICD-10-CM | POA: Diagnosis not present

## 2017-10-30 DIAGNOSIS — Z8679 Personal history of other diseases of the circulatory system: Secondary | ICD-10-CM | POA: Diagnosis not present

## 2017-10-30 LAB — CBC
HEMATOCRIT: 35.3 % (ref 35.0–47.0)
HEMOGLOBIN: 12.1 g/dL (ref 12.0–16.0)
MCH: 30.4 pg (ref 26.0–34.0)
MCHC: 34.2 g/dL (ref 32.0–36.0)
MCV: 88.8 fL (ref 80.0–100.0)
Platelets: 360 10*3/uL (ref 150–440)
RBC: 3.98 MIL/uL (ref 3.80–5.20)
RDW: 13.3 % (ref 11.5–14.5)
WBC: 6.7 10*3/uL (ref 3.6–11.0)

## 2017-10-30 LAB — TROPONIN I: Troponin I: <0.03 ng/mL (ref <0.03)

## 2017-10-30 LAB — BASIC METABOLIC PANEL
ANION GAP: 8 (ref 5–15)
BUN: 16 mg/dL (ref 6–20)
CALCIUM: 9 mg/dL (ref 8.9–10.3)
CHLORIDE: 104 mmol/L (ref 101–111)
CO2: 26 mmol/L (ref 22–32)
Creatinine, Ser: 0.59 mg/dL (ref 0.44–1.00)
GFR calc non Af Amer: >60 mL/min (ref >60)
GLUCOSE: 107 mg/dL — AB (ref 65–99)
POTASSIUM: 4.2 mmol/L (ref 3.5–5.1)
Sodium: 138 mmol/L (ref 135–145)

## 2017-10-30 MED ORDER — ENOXAPARIN SODIUM 40 MG/0.4ML ~~LOC~~ SOLN
40.0000 mg | SUBCUTANEOUS | Status: DC
  Administered 2017-10-30: 40 mg via SUBCUTANEOUS
  Filled 2017-10-30: qty 0.4

## 2017-10-30 MED ORDER — DILTIAZEM HCL ER COATED BEADS 180 MG PO CP24
180.0000 mg | ORAL_CAPSULE | Freq: Two times a day (BID) | ORAL | Status: DC
  Filled 2017-10-30: qty 1

## 2017-10-30 MED ORDER — ASPIRIN 81 MG PO CHEW: 324.0000 mg | CHEWABLE_TABLET | Freq: Every day | ORAL | Status: DC

## 2017-10-30 MED ORDER — POLYETHYLENE GLYCOL 3350 17 G PO PACK: 17.0000 g | PACK | Freq: Every day | ORAL | Status: DC | PRN

## 2017-10-30 MED ORDER — METOPROLOL TARTRATE 25 MG PO TABS
12.5000 mg | ORAL_TABLET | Freq: Two times a day (BID) | ORAL | Status: DC
  Administered 2017-10-30: 12.5 mg via ORAL
  Filled 2017-10-30: qty 1

## 2017-10-30 MED ORDER — SODIUM CHLORIDE 0.9% FLUSH: 3.0000 mL | INTRAVENOUS | Status: DC | PRN

## 2017-10-30 MED ORDER — ONDANSETRON HCL 4 MG/2ML IJ SOLN: 4.0000 mg | Freq: Four times a day (QID) | INTRAMUSCULAR | Status: DC | PRN

## 2017-10-30 MED ORDER — PANTOPRAZOLE SODIUM 40 MG PO TBEC: 40.0000 mg | DELAYED_RELEASE_TABLET | Freq: Every day | ORAL | Status: DC

## 2017-10-30 MED ORDER — ACETAMINOPHEN 325 MG PO TABS: 325.0000 mg | ORAL_TABLET | Freq: Four times a day (QID) | ORAL | Status: DC | PRN

## 2017-10-30 MED ORDER — ASPIRIN 81 MG PO CHEW
162.0000 mg | CHEWABLE_TABLET | Freq: Once | ORAL | Status: AC
  Administered 2017-10-30: 162 mg via ORAL
  Filled 2017-10-30: qty 2

## 2017-10-30 MED ORDER — ACETAMINOPHEN 325 MG PO TABS: 650.0000 mg | ORAL_TABLET | Freq: Four times a day (QID) | ORAL | Status: DC | PRN

## 2017-10-30 MED ORDER — RAMIPRIL 5 MG PO CAPS
5.0000 mg | ORAL_CAPSULE | Freq: Every day | ORAL | Status: DC
  Filled 2017-10-30: qty 1

## 2017-10-30 MED ORDER — HYDROCODONE-ACETAMINOPHEN 5-325 MG PO TABS: 1.0000 | ORAL_TABLET | ORAL | Status: DC | PRN

## 2017-10-30 MED ORDER — ONDANSETRON HCL 4 MG PO TABS: 4.0000 mg | ORAL_TABLET | Freq: Four times a day (QID) | ORAL | Status: DC | PRN

## 2017-10-30 MED ORDER — MONTELUKAST SODIUM 10 MG PO TABS
10.0000 mg | ORAL_TABLET | Freq: Every day | ORAL | Status: DC
Start: 2017-10-30 — End: 2017-10-31
  Filled 2017-10-30: qty 1

## 2017-10-30 MED ORDER — MORPHINE SULFATE (PF) 2 MG/ML IV SOLN: 2.0000 mg | INTRAVENOUS | Status: DC | PRN

## 2017-10-30 MED ORDER — ACETAMINOPHEN 650 MG RE SUPP: 650.0000 mg | Freq: Four times a day (QID) | RECTAL | Status: DC | PRN

## 2017-10-30 MED ORDER — ONDANSETRON HCL 4 MG/2ML IJ SOLN
4.0000 mg | Freq: Once | INTRAMUSCULAR | Status: AC
  Administered 2017-10-30: 4 mg via INTRAVENOUS
  Filled 2017-10-30: qty 2

## 2017-10-30 MED ORDER — CALCIUM CARBONATE ANTACID 500 MG PO CHEW: 500.0000 mg | CHEWABLE_TABLET | Freq: Every day | ORAL | Status: DC

## 2017-10-30 MED ORDER — ALBUTEROL SULFATE (2.5 MG/3ML) 0.083% IN NEBU: 2.5000 mg | INHALATION_SOLUTION | RESPIRATORY_TRACT | Status: DC | PRN

## 2017-10-30 MED ORDER — HYDRALAZINE HCL 20 MG/ML IJ SOLN: 10.0000 mg | INTRAMUSCULAR | Status: DC | PRN

## 2017-10-30 MED ORDER — LORATADINE 10 MG PO TABS: 10.0000 mg | ORAL_TABLET | Freq: Every day | ORAL | Status: DC

## 2017-10-30 MED ORDER — MORPHINE SULFATE (PF) 2 MG/ML IV SOLN
2.0000 mg | Freq: Once | INTRAVENOUS | Status: AC
  Administered 2017-10-30: 2 mg via INTRAVENOUS
  Filled 2017-10-30: qty 1

## 2017-10-30 MED ORDER — ADULT MULTIVITAMIN W/MINERALS CH: 1.0000 | ORAL_TABLET | Freq: Every day | ORAL | Status: DC

## 2017-10-30 MED ORDER — SODIUM CHLORIDE 0.9% FLUSH
3.0000 mL | Freq: Two times a day (BID) | INTRAVENOUS | Status: DC
  Administered 2017-10-30: 3 mL via INTRAVENOUS

## 2017-10-30 MED ORDER — NITROGLYCERIN 0.4 MG SL SUBL
0.4000 mg | SUBLINGUAL_TABLET | SUBLINGUAL | Status: DC | PRN
Start: 2017-10-30 — End: 2017-10-31

## 2017-10-30 MED ORDER — IOPAMIDOL (ISOVUE-370) INJECTION 76%
75.0000 mL | Freq: Once | INTRAVENOUS | Status: AC | PRN
  Administered 2017-10-30: 75 mL via INTRAVENOUS

## 2017-10-30 MED ORDER — SODIUM CHLORIDE 0.9 % IV SOLN: 250.0000 mL | INTRAVENOUS | Status: DC | PRN

## 2017-10-30 NOTE — ED Notes (Signed)
Sent whole rainbow on pt.

## 2017-10-30 NOTE — ED Provider Notes (Signed)
[facility] Emergency Department Provider Note ____________________________________________   First MD Initiated Contact with Patient 10/30/17 1820     (approximate)  I have reviewed the triage vital signs and the nursing notes.   HISTORY  Chief Complaint Chest Pain    HPI Mary Thornton is a 76 y.o. female presents for evaluation of chest pain  This morning began having a heavy pressure that felt like "bricks" laying on her left upper chest.  It radiated through to her left back.  No nausea or vomiting.  Some slight feeling of shortness of breath earlier which she describes a "asthma" feeling which is gone away.  No known coronary disease except once was told she was having coronary spasms about 18 years ago.  She does have high cholesterol and hypertension.  Never smoked.  No nausea or vomiting.  No abdominal pain.  Currently reports pain is somewhat relieved minimal to mild in the left upper chest.  He did take a small amount of aspirin prior to arrival and reports that seems to help some   Past Medical History:  Diagnosis Date  . Arthritis   . Asthma   . Coronary artery disease   . Environmental allergies   . Fibrocystic breast disease   . Hyperlipemia   . Hypertension   . PSVT (paroxysmal supraventricular tachycardia) ([place])   . Reactive airway disease   . Recurrent sinusitis   . Torticollis     Patient Active Problem List   Diagnosis Date Noted  . Cystocele 06/15/2016  . Vaginal atrophy 06/15/2016  . Unstable bladder 06/15/2016  . ARF (acute renal failure) ([place]) 05/14/2016    Past Surgical History:  Procedure Laterality Date  . ABDOMINAL HYSTERECTOMY    . BREAST BIOPSY Left    neg  . COLONOSCOPY WITH PROPOFOL N/A 06/23/2015   Procedure: COLONOSCOPY WITH PROPOFOL;  Surgeon: [name], MD;  Location[facility] ENDOSCOPY;  Service: Endoscopy;  Laterality: N/A;  . ESOPHAGOGASTRODUODENOSCOPY (EGD) WITH PROPOFOL N/A 06/23/2015   Procedure: ESOPHAGOGASTRODUODENOSCOPY (EGD) WITH PROPOFOL;  Surgeon: [name], MD;  Location: [facility] ENDOSCOPY;  Service: Endoscopy;  Laterality: N/A;  . JOINT REPLACEMENT    . KNEE ARTHROSCOPY    . Torticollis surgery x 2      Prior to Admission medications   Medication Sig Start Date End Date Taking? Authorizing Provider  acetaminophen (TYLENOL) 325 MG tablet Take 325-650 mg by mouth every 6 (six) hours as needed for mild pain or moderate pain.     [provider]  calcium carbonate (OS-CAL) 600 MG TABS tablet Take 600 mg by mouth daily.     [provider]  CARTIA XT 180 MG 24 hr capsule Take 180 mg by mouth 2 (two) times daily. 03/03/16   [provider]  cyclobenzaprine (FLEXERIL) 5 MG tablet Take 5 mg by mouth 3 (three) times daily as needed for muscle spasms.    [provider]  diclofenac (VOLTAREN) 75 MG EC tablet Take 75 mg by mouth 2 (two) times daily.    [provider]  estradiol (ESTRACE) 0.1 MG/GM vaginal cream Apply 1 gram per vagina two times a week Patient not taking: Reported on 06/12/2017 06/15/16   Defrancesco, Alanda Slim, MD  loratadine (CLARITIN) 10 MG tablet Take 10 mg by mouth daily.    [provider]  mirabegron ER (MYRBETRIQ) 25 MG TB24 tablet Take 1 tablet (25 mg total) by mouth daily. Patient not taking: Reported on 06/12/2017 09/15/16  Defrancesco, Alanda Slim, MD  montelukast (SINGULAIR) 10 MG tablet Take 10 mg by mouth at bedtime.    [provider]  Multiple Vitamin (MULTIVITAMIN) tablet Take 1 tablet by mouth daily.    [provider]  predniSONE (DELTASONE) 10 MG tablet 6 pills day one in divided doses, 5 pills day 2, 4 pills day 3, etc. Follow package instructions. 06/05/17   [provider]  ramipril (ALTACE) 5 MG capsule Take 5 mg by mouth daily.    [provider]    Allergies Penicillins and Sulfa antibiotics  Family History  Problem Relation Age of Onset  .  Heart disease Mother   . Heart disease Sister   . Cancer Neg Hx   . Diabetes Neg Hx   . Breast cancer Neg Hx     Social History Social History   Tobacco Use  . Smoking status: Never Smoker  . Smokeless tobacco: Never Used  Substance Use Topics  . Alcohol use: No  . Drug use: No    Review of Systems Constitutional: No fever/chills Eyes: No visual changes. ENT: No sore throat. Cardiovascular: See HPI respiratory: Denies shortness of breath. Gastrointestinal: No abdominal pain.  No nausea, no vomiting.  No diarrhea.  No constipation. Genitourinary: Negative for dysuria. Musculoskeletal: Negative for back pain except the pain seemed to radiate towards her left upper back earlier. Skin: Negative for rash. Neurological: Negative for headaches, focal weakness or numbness.    ____________________________________________   PHYSICAL EXAM:  VITAL SIGNS: ED Triage Vitals  Enc Vitals Group     BP 10/30/17 1626 (!) 164/83     Pulse Rate 10/30/17 1626 64     Resp 10/30/17 1626 16     Temp 10/30/17 1626 98.6 F (37 C)     Temp Source 10/30/17 1626 Oral     SpO2 10/30/17 1626 99 %     Weight 10/30/17 1627 113 lb (51.3 kg)     Height 10/30/17 1627 5\' 3"  (1.6 m)     Head Circumference --      Peak Flow --      Pain Score 10/30/17 1639 7     Pain Loc --      Pain Edu? --      Excl. in [place]? --     Constitutional: Alert and oriented. Well appearing and in no acute distress. Eyes: Conjunctivae are normal. Head: Atraumatic. Nose: No congestion/rhinnorhea. Mouth/Throat: Mucous membranes are moist. Neck: No stridor.   Cardiovascular: Normal rate, regular rhythm. Grossly normal heart sounds.  Good peripheral circulation. Respiratory: Normal respiratory effort.  No retractions. Lungs CTAB. Gastrointestinal: Soft and nontender. No distention. Musculoskeletal: No lower extremity tenderness nor edema. Neurologic:  Normal speech and language. No gross focal neurologic deficits are  appreciated.  Skin:  Skin is warm, dry and intact. No rash noted. Psychiatric: Mood and affect are normal. Speech and behavior are normal.  ____________________________________________   LABS (all labs ordered are listed, but only abnormal results are displayed)  Labs Reviewed  BASIC METABOLIC PANEL - Abnormal; Notable for the following components:      Result Value   Glucose, Bld 107 (*)    All other components within normal limits  CBC  TROPONIN I   ____________________________________________  EKG  Reviewed interrupt me at 1630 Ventricular rate 79 QRS 110 QTC 470 Normal sinus rhythm, T wave flattening in a T wave inversion noted in aVL, minimal nonspecific abnormality versus slight artifact in V5 and V6.  These findings  do correlate to the lateral distribution, no evidence of ST elevation ____________________________________________  RADIOLOGY  Dg Chest 2 View  Result Date: 10/30/2017 CLINICAL DATA:  Chest pain and pressure beginning this morning. Nausea. Asthma. Coronary artery disease. EXAM: CHEST  2 VIEW COMPARISON:  None. FINDINGS: The heart size and mediastinal contours are within normal limits. Aortic atherosclerosis. Both lungs are clear. The visualized skeletal structures are unremarkable. IMPRESSION: No active cardiopulmonary disease. Electronically Signed   By: Earle Gell M.D.   On: 10/30/2017 17:07   Ct Angio Chest Aorta W And/or Wo Contrast  Result Date: 10/30/2017 CLINICAL DATA:  Chest pain with radiation to back. Evaluate for dissection. EXAM: CT ANGIOGRAPHY CHEST WITH CONTRAST TECHNIQUE: Multidetector CT imaging of the chest was performed using the standard protocol during bolus administration of intravenous contrast. Multiplanar CT image reconstructions and MIPs were obtained to evaluate the vascular anatomy. CONTRAST:  9mL ISOVUE-370 IOPAMIDOL (ISOVUE-370) INJECTION 76% COMPARISON:  Chest radiograph of earlier today.  Chest CT 11/22/2016 FINDINGS:  Cardiovascular: No intramural hematoma on noncontrast imaging. Postcontrast imaging demonstrates aortic and branch vessel atherosclerosis. No dissection or aneurysm. Moderate cardiomegaly, accentuated by a pectus excavatum deformity. Minimal pericardial fluid or thickening is likely physiologic. Lad coronary artery atherosclerosis. No central pulmonary embolism, on this non-dedicated study. Pulmonary artery enlargement, outflow tract 3.7 cm. Mediastinum/Nodes: No mediastinal or hilar adenopathy. Lungs/Pleura: No pleural fluid. Minimal motion degradation inferiorly. Bibasilar scarring. Upper Abdomen: Normal imaged portions of the liver, spleen, stomach, pancreas, gallbladder, biliary tract, adrenal glands, kidneys. Colonic stool burden suggests constipation. Abdominal aortic and branch vessel atherosclerosis. Musculoskeletal: Fluid in the right glenohumeral sub-acromional recess, including on image 21/series 4. Fluid is present over multiple priors. No acute osseous abnormality. Mild to moderate right hemidiaphragm elevation. Review of the MIP images confirms the above findings. IMPRESSION: 1. No evidence of aortic aneurysm or dissection. 2. Coronary artery atherosclerosis. Aortic Atherosclerosis (ICD10-I70.0). 3.  No acute process in the chest. 4. Pulmonary artery enlargement suggests pulmonary arterial hypertension. 5.  Possible constipation. Electronically Signed   By: Abigail Miyamoto M.D.   On: 10/30/2017 18:58    CT Angie reviewed, no dissection.  However is noted coronary artery disease ____________________________________________   PROCEDURES  Procedure(s) performed: None  Procedures  Critical Care performed: No  ____________________________________________   INITIAL IMPRESSION / ASSESSMENT AND PLAN / ED COURSE  Pertinent labs & imaging results that were available during my care of the patient were reviewed by me and considered in my medical decision making (see chart for details).  Patient  presents for evaluation of chest pain.  EKG does demonstrate slight new T wave inversion in aVL, troponin negative and her pain seems to have eased off after taking aspirin.  Resting comfortably in no distress at present was stable here vital.  Discussed treating with nitroglycerin, but ports she was previously treated that many years ago and had a severe headache from it.  Will trial small dose of morphine and continue to observe her closely I ordered a CT angiography to exclude dissection given the radiation to the back with associated hypertension on arrival  ----------------------------------------- 7:13 PM on 10/30/2017 -----------------------------------------  Patient resting, stable.  Discussed with patient and family, will admit her for ongoing evaluation.  Moderate risk heart score.  No evidence of acute coronary syndrome noted at this time other than EKG change.   ____________________________________________   FINAL CLINICAL IMPRESSION(S) / ED DIAGNOSES  Final diagnoses:  Moderate coronary artery risk chest pain  NEW MEDICATIONS STARTED DURING THIS VISIT:  This SmartLink is deprecated. Use AVSMEDLIST instead to display the medication list for a patient.   Note:  This document was prepared using Dragon voice recognition software and may include unintentional dictation errors.     Delman Kitten, MD 10/30/17 [phone]

## 2017-10-30 NOTE — ED Triage Notes (Signed)
Pt reports awakening with centralized chest pain that radiates through to her back. Pt reports pain as pressure, Denies N/V, diaphoresis, SHOB.

## 2017-10-30 NOTE — H&P (Signed)
[facility] NAME: Mary Thornton    MR#:  [id number]  DATE OF BIRTH:  [birth year]  DATE OF ADMISSION:  10/30/2017  PRIMARY CARE PHYSICIAN: Idelle Crouch, MD   REQUESTING/REFERRING PHYSICIAN:   CHIEF COMPLAINT:   Chief Complaint  Patient presents with  . Chest Pain    HISTORY OF PRESENT ILLNESS: Mary Thornton  is a 76 y.o. 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  PAST MEDICAL HISTORY:   Past Medical History:  Diagnosis Date  . Arthritis   . Asthma   . Coronary artery disease   . Environmental allergies   . Fibrocystic breast disease   . Hyperlipemia   . Hypertension   . PSVT (paroxysmal supraventricular tachycardia) ([place])   . Reactive airway disease   . Recurrent sinusitis   . Torticollis     PAST SURGICAL HISTORY:  Past Surgical History:  Procedure Laterality Date  . ABDOMINAL HYSTERECTOMY    . BREAST BIOPSY Left    neg  . COLONOSCOPY WITH PROPOFOL N/A 06/23/2015   Procedure: COLONOSCOPY WITH PROPOFOL;  Surgeon: [name], MD;  Location: [facility] ENDOSCOPY;  Service: Endoscopy;  Laterality: N/A;  . ESOPHAGOGASTRODUODENOSCOPY (EGD) WITH PROPOFOL N/A 06/23/2015   Procedure: ESOPHAGOGASTRODUODENOSCOPY (EGD) WITH PROPOFOL;  Surgeon: [name], MD;  Location: [facility] ENDOSCOPY;  Service: Endoscopy;  Laterality: N/A;  . JOINT REPLACEMENT    . KNEE ARTHROSCOPY     . Torticollis surgery x 2      SOCIAL HISTORY:  Social History   Tobacco Use  . Smoking status: Never Smoker  . Smokeless tobacco: Never Used  Substance Use Topics  . Alcohol use: No    FAMILY HISTORY:  Family History  Problem Relation Age of Onset  . Heart disease Mother   . Heart disease Sister   . Cancer Neg Hx   . Diabetes Neg Hx   . Breast cancer Neg Hx     DRUG ALLERGIES:  Allergies  Allergen Reactions  . Penicillins Itching and Rash    Has patient had a PCN reaction causing immediate rash, facial/tongue/throat swelling, SOB or lightheadedness with hypotension: Yes Has patient had a PCN reaction causing severe rash involving mucus membranes or skin necrosis: No Has patient had a PCN reaction that required hospitalization No Has patient had a PCN reaction occurring within the last 10 years: No If all of the above answers are "NO", then may proceed with Cephalosporin use.  . Sulfa Antibiotics Rash    REVIEW OF SYSTEMS:   CONSTITUTIONAL: No fever, fatigue or weakness.  EYES: No blurred or double vision.  EARS, NOSE, AND THROAT: No tinnitus or ear pain.  RESPIRATORY: No cough, shortness of breath, wheezing or hemoptysis.  CARDIOVASCULAR: + chest pain, no orthopnea, edema.  GASTROINTESTINAL: + nausea, no vomiting, diarrhea or abdominal pain.  GENITOURINARY: No dysuria, hematuria.  ENDOCRINE: No polyuria, nocturia,  HEMATOLOGY: No anemia, easy bruising or bleeding SKIN: No rash or lesion. MUSCULOSKELETAL: No joint pain  or arthritis.   NEUROLOGIC: No tingling, numbness, weakness.  PSYCHIATRY: No anxiety or depression.   MEDICATIONS AT HOME:  Prior to Admission medications   Medication Sig Start Date End Date Taking? Authorizing Provider  acetaminophen (TYLENOL) 325 MG tablet Take 325-650 mg by mouth every 6 (six) hours as needed for mild pain or moderate pain.    Yes [provider]  aspirin 81 MG chewable tablet Chew 81 mg by mouth daily.   Yes  [provider]  calcium carbonate (OS-CAL) 600 MG TABS tablet Take 600 mg by mouth daily.    Yes [provider]  CARTIA XT 180 MG 24 hr capsule Take 180 mg by mouth 2 (two) times daily. 03/03/16  Yes [provider]  diclofenac (VOLTAREN) 75 MG EC tablet Take 75 mg by mouth 2 (two) times daily.   Yes [provider]  loratadine (CLARITIN) 10 MG tablet Take 10 mg by mouth daily.   Yes [provider]  montelukast (SINGULAIR) 10 MG tablet Take 10 mg by mouth at bedtime.   Yes [provider]  Multiple Vitamin (MULTIVITAMIN) tablet Take 1 tablet by mouth daily.   Yes [provider]  pantoprazole (PROTONIX) 40 MG tablet Take 40 mg by mouth daily. 09/25/17  Yes [provider]  ramipril (ALTACE) 5 MG capsule Take 5 mg by mouth daily.   Yes [provider]      PHYSICAL EXAMINATION:   VITAL SIGNS: Blood pressure 128/87, pulse (!) 52, temperature 98.6 F (37 C), temperature source Oral, resp. rate 18, height 5\' 3"  (1.6 m), weight 51.3 kg (113 lb), SpO2 97 %.  GENERAL:  76 y.o.-year-old patient lying in the bed with no acute distress.  EYES: Pupils equal, round, reactive to light and accommodation. No scleral icterus. Extraocular muscles intact.  HEENT: Head atraumatic, normocephalic. Oropharynx and nasopharynx clear.  Frail-appearing NECK:  Supple, no jugular venous distention. No thyroid enlargement, no tenderness.  LUNGS: Normal breath sounds bilaterally, no wheezing, rales,rhonchi or crepitation. No use of accessory muscles of respiration.  CARDIOVASCULAR: S1, S2 normal. No murmurs, rubs, or gallops.  ABDOMEN: Soft, nontender, nondistended. Bowel sounds present. No organomegaly or mass.  EXTREMITIES: No pedal edema, cyanosis, or clubbing.  NEUROLOGIC: Cranial nerves II through XII are intact. MAES. Gait not checked.  PSYCHIATRIC: The patient is alert and oriented x 3.  SKIN: No obvious rash, lesion, or ulcer.    LABORATORY PANEL:   CBC Recent Labs  Lab 10/30/17 1630  WBC 6.7  HGB 12.1  HCT 35.3  PLT 360  MCV 88.8  MCH 30.4  MCHC 34.2  RDW 13.3   ------------------------------------------------------------------------------------------------------------------  Chemistries  Recent Labs  Lab 10/30/17 1630  NA 138  K 4.2  CL 104  CO2 26  GLUCOSE 107*  BUN 16  CREATININE 0.59  CALCIUM 9.0   ------------------------------------------------------------------------------------------------------------------ estimated creatinine clearance is 48.5 mL/min (by C-G formula based on SCr of 0.59 mg/dL). ------------------------------------------------------------------------------------------------------------------ No results for input(s): TSH, T4TOTAL, T3FREE, THYROIDAB in the last 72 hours.  Invalid input(s): FREET3   Coagulation profile No results for input(s): INR, PROTIME in the last 168 hours. ------------------------------------------------------------------------------------------------------------------- No results for input(s): DDIMER in the last 72 hours. -------------------------------------------------------------------------------------------------------------------  Cardiac Enzymes Recent Labs  Lab 10/30/17 1630  TROPONINI <0.03   ------------------------------------------------------------------------------------------------------------------ Invalid input(s): POCBNP  ---------------------------------------------------------------------------------------------------------------  Urinalysis    Component Value Date/Time   COLORURINE YELLOW (A) 05/14/2016 1730   APPEARANCEUR CLEAR (A) 05/14/2016 1730   APPEARANCEUR Hazy 10/31/2014 1962  LABSPEC 1.009 05/14/2016 1730   LABSPEC 1.041 10/31/2014 0718   PHURINE 5.0 05/14/2016 1730   GLUCOSEU NEGATIVE 05/14/2016 1730   GLUCOSEU Negative 10/31/2014 0718   HGBUR 2+ (A) 05/14/2016 1730   BILIRUBINUR neg  09/15/2016 0841   BILIRUBINUR Negative 10/31/2014 0718   KETONESUR NEGATIVE 05/14/2016 1730   PROTEINUR neg 09/15/2016 0841   PROTEINUR NEGATIVE 05/14/2016 1730   UROBILINOGEN negative 09/15/2016 0841   NITRITE neg 09/15/2016 0841   NITRITE NEGATIVE 05/14/2016 1730   LEUKOCYTESUR Negative 09/15/2016 0841   LEUKOCYTESUR 1+ 10/31/2014 0718     RADIOLOGY: Dg Chest 2 View  Result Date: 10/30/2017 CLINICAL DATA:  Chest pain and pressure beginning this morning. Nausea. Asthma. Coronary artery disease. EXAM: CHEST  2 VIEW COMPARISON:  None. FINDINGS: The heart size and mediastinal contours are within normal limits. Aortic atherosclerosis. Both lungs are clear. The visualized skeletal structures are unremarkable. IMPRESSION: No active cardiopulmonary disease. Electronically Signed   By: Earle Gell M.D.   On: 10/30/2017 17:07   Ct Angio Chest Aorta W And/or Wo Contrast  Result Date: 10/30/2017 CLINICAL DATA:  Chest pain with radiation to back. Evaluate for dissection. EXAM: CT ANGIOGRAPHY CHEST WITH CONTRAST TECHNIQUE: Multidetector CT imaging of the chest was performed using the standard protocol during bolus administration of intravenous contrast. Multiplanar CT image reconstructions and MIPs were obtained to evaluate the vascular anatomy. CONTRAST:  54mL ISOVUE-370 IOPAMIDOL (ISOVUE-370) INJECTION 76% COMPARISON:  Chest radiograph of earlier today.  Chest CT 11/22/2016 FINDINGS: Cardiovascular: No intramural hematoma on noncontrast imaging. Postcontrast imaging demonstrates aortic and branch vessel atherosclerosis. No dissection or aneurysm. Moderate cardiomegaly, accentuated by a pectus excavatum deformity. Minimal pericardial fluid or thickening is likely physiologic. Lad coronary artery atherosclerosis. No central pulmonary embolism, on this non-dedicated study. Pulmonary artery enlargement, outflow tract 3.7 cm. Mediastinum/Nodes: No mediastinal or hilar adenopathy. Lungs/Pleura: No pleural  fluid. Minimal motion degradation inferiorly. Bibasilar scarring. Upper Abdomen: Normal imaged portions of the liver, spleen, stomach, pancreas, gallbladder, biliary tract, adrenal glands, kidneys. Colonic stool burden suggests constipation. Abdominal aortic and branch vessel atherosclerosis. Musculoskeletal: Fluid in the right glenohumeral sub-acromional recess, including on image 21/series 4. Fluid is present over multiple priors. No acute osseous abnormality. Mild to moderate right hemidiaphragm elevation. Review of the MIP images confirms the above findings. IMPRESSION: 1. No evidence of aortic aneurysm or dissection. 2. Coronary artery atherosclerosis. Aortic Atherosclerosis (ICD10-I70.0). 3.  No acute process in the chest. 4. Pulmonary artery enlargement suggests pulmonary arterial hypertension. 5.  Possible constipation. Electronically Signed   By: Abigail Miyamoto M.D.   On: 10/30/2017 18:58    EKG: Orders placed or performed during the hospital encounter of 10/30/17  . EKG 12-Lead  . EKG 12-Lead  . ED EKG within 10 minutes  . ED EKG within 10 minutes    IMPRESSION AND PLAN: 1 acute chest pain/probable angina Referred to the observation unit on our ACS protocol, rule out acute coronary syndrome with cardiac enzymes x3 sets, aspirin daily, supplemental oxygen as needed, nitroglycerin as needed, morphine as needed breakthrough pain, beta-blocker therapy, continue ACE inhibitor, statin therapy, check lipids in the morning, consult cardiology given abnormal EKG, if rules out will proceed with nuclear medicine stress test in a.m.  2 history of coronary artery disease Aspirin, statin therapy, ACE inhibitor, beta-blocker therapy, check lipids in the morning  3 history of asthma without exacerbation Stable Breathing treatments as needed  4 chronic benign essential hypertension Stable continue home regiment , Add low-dose beta-blocker therapy,  as needed hydralazine for systolic blood pressure  greater than 160, vitals per routine, make changes as per necessary  5 history of paroxysmal supraventricular tachycardia Stable Continue Cartia XT  Full code Condition stable Prognosis fair DVT prophylaxis with Lovenox subcu Disposition home tomorrow barring any complications   All the records are reviewed and case discussed with ED provider. Management plans discussed with the patient, family and they are in agreement.  CODE STATUS: Code Status History    Date Active Date Inactive Code Status Order ID Comments User Context   05/14/2016 12:06 05/16/2016 14:12 Full Code [id number]  Gladstone Lighter, MD Inpatient    Advance Directive Documentation     Most Recent Value  Type of Advance Directive  Healthcare Power of Attorney, Living will  Pre-existing out of facility DNR order (yellow form or pink MOST form)  No data  "MOST" Form in Place?  No data       TOTAL TIME TAKING CARE OF THIS PATIENT: 45 minutes.    Avel Peace Rudie Sermons M.D on 10/30/2017   Between 7am to 6pm - Pager - [phone]  After 6pm go to [email or web address] - password [email or web address] [facility]  Office  [phone]  CC: Primary care physician; Idelle Crouch, MD   Note: This dictation was prepared with Dragon dictation along with smaller phrase technology. Any transcriptional errors that result from this process are unintentional.

## 2017-10-31 DIAGNOSIS — J45909 Unspecified asthma, uncomplicated: Secondary | ICD-10-CM | POA: Diagnosis not present

## 2017-10-31 DIAGNOSIS — I2510 Atherosclerotic heart disease of native coronary artery without angina pectoris: Secondary | ICD-10-CM | POA: Diagnosis not present

## 2017-10-31 DIAGNOSIS — R079 Chest pain, unspecified: Secondary | ICD-10-CM | POA: Diagnosis not present

## 2017-10-31 DIAGNOSIS — Z8679 Personal history of other diseases of the circulatory system: Secondary | ICD-10-CM | POA: Diagnosis not present

## 2017-10-31 LAB — LIPID PANEL
CHOL/HDL RATIO: 3.3 ratio
CHOLESTEROL: 217 mg/dL — AB (ref 0–200)
HDL: 65 mg/dL (ref >40)
LDL Cholesterol: 136 mg/dL — ABNORMAL HIGH (ref 0–99)
Triglycerides: 80 mg/dL (ref <150)
VLDL: 16 mg/dL (ref 0–40)

## 2017-10-31 LAB — TROPONIN I: Troponin I: <0.03 ng/mL (ref <0.03)

## 2017-10-31 MED ORDER — ISOSORBIDE MONONITRATE ER 30 MG PO TB24: 30.0000 mg | ORAL_TABLET | Freq: Every day | ORAL | 0 refills | Status: DC

## 2017-10-31 MED ORDER — METOPROLOL TARTRATE 25 MG PO TABS: 12.5000 mg | ORAL_TABLET | Freq: Two times a day (BID) | ORAL | 0 refills | Status: DC

## 2017-10-31 MED ORDER — ATORVASTATIN CALCIUM 40 MG PO TABS: 40.0000 mg | ORAL_TABLET | Freq: Every day | ORAL | 0 refills | Status: AC

## 2017-10-31 MED ORDER — ATORVASTATIN CALCIUM 40 MG PO TABS: 40.0000 mg | ORAL_TABLET | Freq: Every day | ORAL | 0 refills | Status: DC

## 2017-10-31 NOTE — Care Management Obs Status (Signed)
[facility] NOTIFICATION   Patient Details  Name: Mylia Pondexter MRN: [id number] Date of Birth: [birth year]   Medicare Observation Status Notification Given:  No  Discharge order placed in < 24hr of being placed on observation  Katrina Stack, RN 10/31/2017, 10:15 AM

## 2017-10-31 NOTE — Discharge Summary (Signed)
[facility] NAME: Mary Thornton    MR#:  [id number]  DATE OF BIRTH:  Aug 30, 1941  DATE OF ADMISSION:  10/30/2017 ADMITTING PHYSICIAN: Gorden Harms, MD  DATE OF DISCHARGE: 10/31/2017  PRIMARY CARE PHYSICIAN: Idelle Crouch, MD    ADMISSION DIAGNOSIS:  Moderate coronary artery risk chest pain [R07.9]  DISCHARGE DIAGNOSIS:  Active Problems:   Chest pain   SECONDARY DIAGNOSIS:   Past Medical History:  Diagnosis Date  . Arthritis   . Asthma   . Coronary artery disease   . Environmental allergies   . Fibrocystic breast disease   . Hyperlipemia   . Hypertension   . PSVT (paroxysmal supraventricular tachycardia) ([place])   . Reactive airway disease   . Recurrent sinusitis   . Torticollis     HOSPITAL COURSE:   1 acute chest pain/probable angina 3 troponin negative to rule out acute coronary syndrome  On Aspirin, LDL is high, added atorvastatin, Seen by a cardiologist- Dr. Clayborn Bigness Due to holiday today, no stress test available. Pt is low risk.  As per Dr. Clayborn Bigness, no Change in EKG also. Suggested to stop ramipril, start low dose metoprolol and Imdur and let her come to his office tomorrow, for further cardiac work ups.  She walked around nursing station without any pain.  2 history of coronary artery disease Aspirin, statin therapy, follow with cardiologist.  3 history of asthma without exacerbation Stable Breathing treatments as needed  4 chronic benign essential hypertension Stable continue home regiment  5 history of paroxysmal supraventricular tachycardia Stable    DISCHARGE CONDITIONS:   Stable.  CONSULTS OBTAINED:  Treatment Team:  Yolonda Kida, MD Corey Skains, MD  DRUG ALLERGIES:   Allergies  Allergen Reactions  . Penicillins Itching and Rash    Has patient had a PCN reaction causing immediate rash, facial/tongue/throat swelling, SOB or lightheadedness with  hypotension: Yes Has patient had a PCN reaction causing severe rash involving mucus membranes or skin necrosis: No Has patient had a PCN reaction that required hospitalization No Has patient had a PCN reaction occurring within the last 10 years: No If all of the above answers are "NO", then may proceed with Cephalosporin use.  . Sulfa Antibiotics Rash    DISCHARGE MEDICATIONS:   Allergies as of 10/31/2017      Reactions   Penicillins Itching, Rash   Has patient had a PCN reaction causing immediate rash, facial/tongue/throat swelling, SOB or lightheadedness with hypotension: Yes Has patient had a PCN reaction causing severe rash involving mucus membranes or skin necrosis: No Has patient had a PCN reaction that required hospitalization No Has patient had a PCN reaction occurring within the last 10 years: No If all of the above answers are "NO", then may proceed with Cephalosporin use.   Sulfa Antibiotics Rash      Medication List    STOP taking these medications   ramipril 5 MG capsule Commonly known as:  ALTACE     TAKE these medications   acetaminophen 325 MG tablet Commonly known as:  TYLENOL Take 325-650 mg by mouth every 6 (six) hours as needed for mild pain or moderate pain.   aspirin 81 MG chewable tablet Chew 81 mg by mouth daily.   atorvastatin 40 MG tablet Commonly known as:  LIPITOR Take 1 tablet (40 mg total) by mouth daily.   calcium carbonate 600 MG Tabs tablet Commonly known as:  OS-CAL Take 600 mg  by mouth daily.   CARTIA XT 180 MG 24 hr capsule Generic drug:  diltiazem Take 180 mg by mouth 2 (two) times daily.   diclofenac 75 MG EC tablet Commonly known as:  VOLTAREN Take 75 mg by mouth 2 (two) times daily.   isosorbide mononitrate 30 MG 24 hr tablet Commonly known as:  IMDUR Take 1 tablet (30 mg total) by mouth daily for 7 days.   loratadine 10 MG tablet Commonly known as:  CLARITIN Take 10 mg by mouth daily.   metoprolol tartrate 25 MG  tablet Commonly known as:  LOPRESSOR Take 0.5 tablets (12.5 mg total) by mouth 2 (two) times daily.   montelukast 10 MG tablet Commonly known as:  SINGULAIR Take 10 mg by mouth at bedtime.   multivitamin tablet Take 1 tablet by mouth daily.   pantoprazole 40 MG tablet Commonly known as:  PROTONIX Take 40 mg by mouth daily.        DISCHARGE INSTRUCTIONS:    Follow with cardiologist office tomorrow.  If you experience worsening of your admission symptoms, develop shortness of breath, life threatening emergency, suicidal or homicidal thoughts you must seek medical attention immediately by calling 911 or calling your MD immediately  if symptoms less severe.  You Must read complete instructions/literature along with all the possible adverse reactions/side effects for all the Medicines you take and that have been prescribed to you. Take any new Medicines after you have completely understood and accept all the possible adverse reactions/side effects.   Please note  You were cared for by a hospitalist during your hospital stay. If you have any questions about your discharge medications or the care you received while you were in the hospital after you are discharged, you can call the unit and asked to speak with the hospitalist on call if the hospitalist that took care of you is not available. Once you are discharged, your primary care physician will handle any further medical issues. Please note that NO REFILLS for any discharge medications will be authorized once you are discharged, as it is imperative that you return to your primary care physician (or establish a relationship with a primary care physician if you do not have one) for your aftercare needs so that they can reassess your need for medications and monitor your lab values.    Today   CHIEF COMPLAINT:   Chief Complaint  Patient presents with  . Chest Pain    HISTORY OF PRESENT ILLNESS:  Mary Thornton  is a 77 y.o.  female presenting with acute chest pressure/heaviness to her chest upon awakening, radiating into her back, not associated with shortness of breath/diaphoresis, made worse with activity, chest pain made better by morphine given in the emergency room, EKG noted for anterior old infarct/T wave inversion far laterally, CT chest did not show any evidence for acute process/questionable pulmonary hypertension, patient evaluated in the emergency room, in no apparent distress, no chest pain, family at bedside, patient is now being admitted for acute chest pain/probable angina.    VITAL SIGNS:  Blood pressure (!) 138/58, pulse 60, temperature 97.7 F (36.5 C), temperature source Oral, resp. rate 18, height 5\' 3"  (1.6 m), weight 50.9 kg (112 lb 3.2 oz), SpO2 97 %.  I/O:    Intake/Output Summary (Last 24 hours) at 10/31/2017 0947 Last data filed at 10/30/2017 2113 Gross per 24 hour  Intake -  Output 300 ml  Net -300 ml    PHYSICAL EXAMINATION:  GENERAL:  77  y.o.-year-old patient lying in the bed with no acute distress.  EYES: Pupils equal, round, reactive to light and accommodation. No scleral icterus. Extraocular muscles intact.  HEENT: Head atraumatic, normocephalic. Oropharynx and nasopharynx clear.  NECK:  Supple, no jugular venous distention. No thyroid enlargement, no tenderness.  LUNGS: Normal breath sounds bilaterally, no wheezing, rales,rhonchi or crepitation. No use of accessory muscles of respiration.  CARDIOVASCULAR: S1, S2 normal. No murmurs, rubs, or gallops.  ABDOMEN: Soft, non-tender, non-distended. Bowel sounds present. No organomegaly or mass.  EXTREMITIES: No pedal edema, cyanosis, or clubbing.  NEUROLOGIC: Cranial nerves II through XII are intact. Muscle strength 5/5 in all extremities. Sensation intact. Gait not checked.  PSYCHIATRIC: The patient is alert and oriented x 3.  SKIN: No obvious rash, lesion, or ulcer.   DATA REVIEW:   CBC Recent Labs  Lab 10/30/17 1630  WBC  6.7  HGB 12.1  HCT 35.3  PLT 360    Chemistries  Recent Labs  Lab 10/30/17 1630  NA 138  K 4.2  CL 104  CO2 26  GLUCOSE 107*  BUN 16  CREATININE 0.59  CALCIUM 9.0    Cardiac Enzymes Recent Labs  Lab 10/31/17 0851  TROPONINI <0.03    Microbiology Results  Results for orders placed or performed in visit on 09/15/16  Urine culture     Status: None   Collection Time: 09/15/16 10:14 AM  Result Value Ref Range Status   Urine Culture, Routine Final report  Final   Organism ID, Bacteria No growth  Final    RADIOLOGY:  Dg Chest 2 View  Result Date: 10/30/2017 CLINICAL DATA:  Chest pain and pressure beginning this morning. Nausea. Asthma. Coronary artery disease. EXAM: CHEST  2 VIEW COMPARISON:  None. FINDINGS: The heart size and mediastinal contours are within normal limits. Aortic atherosclerosis. Both lungs are clear. The visualized skeletal structures are unremarkable. IMPRESSION: No active cardiopulmonary disease. Electronically Signed   By: Earle Gell M.D.   On: 10/30/2017 17:07   Ct Angio Chest Aorta W And/or Wo Contrast  Result Date: 10/30/2017 CLINICAL DATA:  Chest pain with radiation to back. Evaluate for dissection. EXAM: CT ANGIOGRAPHY CHEST WITH CONTRAST TECHNIQUE: Multidetector CT imaging of the chest was performed using the standard protocol during bolus administration of intravenous contrast. Multiplanar CT image reconstructions and MIPs were obtained to evaluate the vascular anatomy. CONTRAST:  69mL ISOVUE-370 IOPAMIDOL (ISOVUE-370) INJECTION 76% COMPARISON:  Chest radiograph of earlier today.  Chest CT 11/22/2016 FINDINGS: Cardiovascular: No intramural hematoma on noncontrast imaging. Postcontrast imaging demonstrates aortic and branch vessel atherosclerosis. No dissection or aneurysm. Moderate cardiomegaly, accentuated by a pectus excavatum deformity. Minimal pericardial fluid or thickening is likely physiologic. Lad coronary artery atherosclerosis. No central  pulmonary embolism, on this non-dedicated study. Pulmonary artery enlargement, outflow tract 3.7 cm. Mediastinum/Nodes: No mediastinal or hilar adenopathy. Lungs/Pleura: No pleural fluid. Minimal motion degradation inferiorly. Bibasilar scarring. Upper Abdomen: Normal imaged portions of the liver, spleen, stomach, pancreas, gallbladder, biliary tract, adrenal glands, kidneys. Colonic stool burden suggests constipation. Abdominal aortic and branch vessel atherosclerosis. Musculoskeletal: Fluid in the right glenohumeral sub-acromional recess, including on image 21/series 4. Fluid is present over multiple priors. No acute osseous abnormality. Mild to moderate right hemidiaphragm elevation. Review of the MIP images confirms the above findings. IMPRESSION: 1. No evidence of aortic aneurysm or dissection. 2. Coronary artery atherosclerosis. Aortic Atherosclerosis (ICD10-I70.0). 3.  No acute process in the chest. 4. Pulmonary artery enlargement suggests pulmonary arterial hypertension. 5.  Possible constipation.  Electronically Signed   By: Abigail Miyamoto M.D.   On: 10/30/2017 18:58    EKG:   Orders placed or performed during the hospital encounter of 10/30/17  . EKG 12-Lead  . EKG 12-Lead  . ED EKG within 10 minutes  . ED EKG within 10 minutes      Management plans discussed with the patient, family and they are in agreement.  CODE STATUS:     Code Status Orders  (From admission, onward)        Start     Ordered   10/30/17 2044  Full code  Continuous     10/30/17 2043    Code Status History    Date Active Date Inactive Code Status Order ID Comments User Context   05/14/2016 12:06 05/16/2016 14:12 Full Code [id number]  Gladstone Lighter, MD Inpatient    Advance Directive Documentation     Most Recent Value  Type of Advance Directive  Healthcare Power of Attorney, Living will  Pre-existing out of facility DNR order (yellow form or pink MOST form)  No data  "MOST" Form in Place?  No data       TOTAL TIME TAKING CARE OF THIS PATIENT: 35 minutes.    Vaughan Basta M.D on 10/31/2017 at 9:47 AM  Between 7am to 6pm - Pager - [phone]  After 6pm go to [email or web address] - password [email or web address] [facility]  Office  [phone]  CC: Primary care physician; Idelle Crouch, MD   Note: This dictation was prepared with Dragon dictation along with smaller phrase technology. Any transcriptional errors that result from this process are unintentional.

## 2017-10-31 NOTE — Progress Notes (Signed)
A & O. Room air. Pt has not reported any pain. IV and tele removed. Discharge instructions given to pt. Prescriptions given to pt. Pt has no further concerns at this time.

## 2017-10-31 NOTE — Progress Notes (Signed)
Patient arrived to 2A Room 250. Patient denies pain and all questions answered. Patient oriented to unit and use of call bell/room phone. Skin assessment completed with Mary Liberty RN and skin intact. A&Ox 4, VSS, and SB on verified tele-box #[id number]. Nursing staff will continue to monitor for any changes in patient status. Mary Reaper, RN

## 2017-10-31 NOTE — Consult Note (Signed)
Reason for Consult: Chest pain abnormal EKG possible angina Referring Physician: Dr Margaretmary Eddy hospitalist  Mary Thornton is an 77 y.o. female.  HPI: Patient a 77 year old female presents with chest pain symptoms found to have abnormal EKG.  Patient having symptoms on and off for the last few days.  Patient denies significant cardiac history has had cardiac workup almost 20 years ago which is unremarkable.. Patient complains of some shortness of breath reactive airway disease previous history of SVT.  Patient reportedly has mild atherosclerotic vascular disease in the past now with anginal symptoms here for further cardiac assessment evaluation  Past Medical History:  Diagnosis Date  . Arthritis   . Asthma   . Coronary artery disease   . Environmental allergies   . Fibrocystic breast disease   . Hyperlipemia   . Hypertension   . PSVT (paroxysmal supraventricular tachycardia) ([place])   . Reactive airway disease   . Recurrent sinusitis   . Torticollis     Past Surgical History:  Procedure Laterality Date  . ABDOMINAL HYSTERECTOMY    . BREAST BIOPSY Left    neg  . COLONOSCOPY WITH PROPOFOL N/A 06/23/2015   Procedure: COLONOSCOPY WITH PROPOFOL;  Surgeon: [name], MD;  [facility] ENDOSCOPY;  Service: Endoscopy;  Laterality: N/A;  . ESOPHAGOGASTRODUODENOSCOPY (EGD) WITH PROPOFOL N/A 06/23/2015   Procedure: ESOPHAGOGASTRODUODENOSCOPY (EGD) WITH PROPOFOL;  Surgeon: [name], MD;  Location: [facility] ENDOSCOPY;  Service: Endoscopy;  Laterality: N/A;  . JOINT REPLACEMENT    . KNEE ARTHROSCOPY    . Torticollis surgery x 2      Family History  Problem Relation Age of Onset  . Heart disease Mother   . Heart disease Sister   . Cancer Neg Hx   . Diabetes Neg Hx   . Breast cancer Neg Hx     Social History:  reports that  has never smoked. she has never used smokeless tobacco. She reports that she does not drink alcohol or use drugs.  Allergies:  Allergies  Allergen  Reactions  . Penicillins Itching and Rash    Has patient had a PCN reaction causing immediate rash, facial/tongue/throat swelling, SOB or lightheadedness with hypotension: Yes Has patient had a PCN reaction causing severe rash involving mucus membranes or skin necrosis: No Has patient had a PCN reaction that required hospitalization No Has patient had a PCN reaction occurring within the last 10 years: No If all of the above answers are "NO", then may proceed with Cephalosporin use.  . Sulfa Antibiotics Rash    Medications: Please see previous medication list  Results for orders placed or performed during the hospital encounter of 10/30/17 (from the past 48 hour(s))  Basic metabolic panel     Status: Abnormal   Collection Time: 10/30/17  4:30 PM  Result Value Ref Range   Sodium 138 135 - 145 mmol/L   Potassium 4.2 3.5 - 5.1 mmol/L   Chloride 104 101 - 111 mmol/L   CO2 26 22 - 32 mmol/L   Glucose, Bld 107 (H) 65 - 99 mg/dL   BUN 16 6 - 20 mg/dL   Creatinine, Ser 0.59 0.44 - 1.00 mg/dL   Calcium 9.0 8.9 - 10.3 mg/dL   GFR calc non Af Amer >60 >60 mL/min   GFR calc Af Amer >60 >60 mL/min    Comment: (NOTE) The eGFR has been calculated using the CKD EPI equation. This calculation has not been validated in all clinical situations. eGFR's persistently <60 mL/min signify  possible Chronic Kidney Disease.    Anion gap 8 5 - 15    Comment: Performed at [facility], [address] [place], [place] [address]  CBC     Status: None   Collection Time: 10/30/17  4:30 PM  Result Value Ref Range   WBC 6.7 3.6 - 11.0 K/uL   RBC 3.98 3.80 - 5.20 MIL/uL   Hemoglobin 12.1 12.0 - 16.0 g/dL   HCT 35.3 35.0 - 47.0 %   MCV 88.8 80.0 - 100.0 fL   MCH 30.4 26.0 - 34.0 pg   MCHC 34.2 32.0 - 36.0 g/dL   RDW 13.3 11.5 - 14.5 %   Platelets 360 150 - 440 K/uL    Comment: Performed at [facility], [address] [place], [place] [address]  Troponin I     Status: None   Collection  Time: 10/30/17  4:30 PM  Result Value Ref Range   Troponin I <0.03 <0.03 ng/mL    Comment: Performed at [facility], [address] [place], [place] [address]  Troponin I     Status: None   Collection Time: 10/30/17  9:57 PM  Result Value Ref Range   Troponin I <0.03 <0.03 ng/mL    Comment: Performed at [facility], [address] [place], [place] [address]  Troponin I     Status: None   Collection Time: 10/31/17  4:06 AM  Result Value Ref Range   Troponin I <0.03 <0.03 ng/mL    Comment: Performed at [facility], [address] [place], [place] [address]  Troponin I     Status: None   Collection Time: 10/31/17  8:51 AM  Result Value Ref Range   Troponin I <0.03 <0.03 ng/mL    Comment: Performed at [facility], [address] [place], [place] [address]  Lipid panel     Status: Abnormal   Collection Time: 10/31/17  8:51 AM  Result Value Ref Range   Cholesterol 217 (H) 0 - 200 mg/dL   Triglycerides 80 <150 mg/dL   HDL 65 >40 mg/dL   Total CHOL/HDL Ratio 3.3 RATIO   VLDL 16 0 - 40 mg/dL   LDL Cholesterol 136 (H) 0 - 99 mg/dL    Comment:        Total Cholesterol/HDL:CHD Risk Coronary Heart Disease Risk Table                     Men   Women  1/2 Average Risk   3.4   3.3  Average Risk       5.0   4.4  2 X Average Risk   9.6   7.1  3 X Average Risk  23.4   11.0        Use the calculated Patient Ratio above and the CHD Risk Table to determine the patient's CHD Risk.        ATP III CLASSIFICATION (LDL):  <100     mg/dL   Optimal  100-129  mg/dL   Near or Above                    Optimal  130-159  mg/dL   Borderline  160-189  mg/dL   High  >190     mg/dL   Very High Performed at [facility], [address] [place], [place] [address]     Dg Chest 2 View  Result Date: 10/30/2017 CLINICAL DATA:  Chest pain and pressure beginning this morning. Nausea. Asthma. Coronary artery disease. EXAM: CHEST  2 VIEW COMPARISON:   None. FINDINGS: The heart size and mediastinal contours are within normal limits. Aortic atherosclerosis. Both lungs are clear. The visualized skeletal structures are unremarkable. IMPRESSION: No active cardiopulmonary disease. Electronically Signed   By: Earle Gell M.Thornton.   On: 10/30/2017 17:07   Ct Angio Chest Aorta W And/or Wo Contrast  Result Date: 10/30/2017 CLINICAL DATA:  Chest pain with radiation to back. Evaluate for dissection. EXAM: CT ANGIOGRAPHY CHEST WITH CONTRAST TECHNIQUE: Multidetector CT imaging of the chest was performed using the standard protocol during bolus administration of intravenous contrast. Multiplanar CT image reconstructions and MIPs were obtained to evaluate the vascular anatomy. CONTRAST:  9m ISOVUE-370 IOPAMIDOL (ISOVUE-370) INJECTION 76% COMPARISON:  Chest radiograph of earlier today.  Chest CT 11/22/2016 FINDINGS: Cardiovascular: No intramural hematoma on noncontrast imaging. Postcontrast imaging demonstrates aortic and branch vessel atherosclerosis. No dissection or aneurysm. Moderate cardiomegaly, accentuated by a pectus excavatum deformity. Minimal pericardial fluid or thickening is likely physiologic. Lad coronary artery atherosclerosis. No central pulmonary embolism, on this non-dedicated study. Pulmonary artery enlargement, outflow tract 3.7 cm. Mediastinum/Nodes: No mediastinal or hilar adenopathy. Lungs/Pleura: No pleural fluid. Minimal motion degradation inferiorly. Bibasilar scarring. Upper Abdomen: Normal imaged portions of the liver, spleen, stomach, pancreas, gallbladder, biliary tract, adrenal glands, kidneys. Colonic stool burden suggests constipation. Abdominal aortic and branch vessel atherosclerosis. Musculoskeletal: Fluid in the right glenohumeral sub-acromional recess, including on image 21/series 4. Fluid is present over multiple priors. No acute osseous abnormality. Mild to moderate right hemidiaphragm elevation. Review of the MIP images confirms the  above findings. IMPRESSION: 1. No evidence of aortic aneurysm or dissection. 2. Coronary artery atherosclerosis. Aortic Atherosclerosis (ICD10-I70.0). 3.  No acute process in the chest. 4. Pulmonary artery enlargement suggests pulmonary arterial hypertension. 5.  Possible constipation. Electronically Signed   By: KAbigail MiyamotoM.Thornton.   On: 10/30/2017 18:58    Review of Systems  Constitutional: Positive for malaise/fatigue.  HENT: Negative.   Eyes: Negative.   Respiratory: Positive for shortness of breath.   Cardiovascular: Positive for chest pain.  Gastrointestinal: Negative.   Genitourinary: Negative.   Musculoskeletal: Positive for myalgias.  Skin: Negative.   Neurological: Positive for weakness.  Endo/Heme/Allergies: Negative.   Psychiatric/Behavioral: Negative.    Blood pressure (!) 138/58, pulse 60, temperature 97.7 F (36.5 C), temperature source Oral, resp. rate 18, height _0  (1.6 m), weight 112 lb 3.2 oz (50.9 kg), SpO2 97 %. Physical Exam  Nursing note and vitals reviewed. Constitutional: She is oriented to person, place, and time. She appears well-developed and well-nourished.  HENT:  Head: Normocephalic and atraumatic.  Eyes: Conjunctivae and EOM are normal. Pupils are equal, round, and reactive to light.  Neck: Normal range of motion. Neck supple.  Cardiovascular: Normal rate and regular rhythm.  Murmur heard. Respiratory: Effort normal and breath sounds normal.  GI: Soft. Bowel sounds are normal.  Musculoskeletal: Normal range of motion.  Neurological: She is alert and oriented to person, place, and time. She has normal reflexes.  Skin: Skin is warm and dry.  Psychiatric: She has a normal mood and affect.    Assessment/Plan: Chest pain Angina Abnormal EKG . PLAN Agree with out myocardial infarction Follow up EKGs and troponins Command echocardiogram for further assessment Consider functional study can be done as an outpatient Recommend discharge home with  follow-up cardiology as an outpatient within 1 week Blood pressure control beta-blocker aspirin nitrates  Mary Thornton Aristides Luckey 10/31/2017, 3:06 PM

## 2017-11-02 DIAGNOSIS — R0602 Shortness of breath: Secondary | ICD-10-CM | POA: Diagnosis not present

## 2017-11-02 DIAGNOSIS — E782 Mixed hyperlipidemia: Secondary | ICD-10-CM | POA: Diagnosis not present

## 2017-11-02 DIAGNOSIS — I471 Supraventricular tachycardia: Secondary | ICD-10-CM | POA: Diagnosis not present

## 2017-11-02 DIAGNOSIS — I208 Other forms of angina pectoris: Secondary | ICD-10-CM | POA: Diagnosis not present

## 2017-11-02 DIAGNOSIS — R002 Palpitations: Secondary | ICD-10-CM | POA: Diagnosis not present

## 2017-11-02 DIAGNOSIS — J45909 Unspecified asthma, uncomplicated: Secondary | ICD-10-CM | POA: Diagnosis not present

## 2017-11-02 DIAGNOSIS — I10 Essential (primary) hypertension: Secondary | ICD-10-CM | POA: Diagnosis not present

## 2017-11-02 DIAGNOSIS — M436 Torticollis: Secondary | ICD-10-CM | POA: Diagnosis not present

## 2017-11-02 DIAGNOSIS — K219 Gastro-esophageal reflux disease without esophagitis: Secondary | ICD-10-CM | POA: Diagnosis not present

## 2017-11-14 DIAGNOSIS — R0602 Shortness of breath: Secondary | ICD-10-CM | POA: Diagnosis not present

## 2017-11-14 DIAGNOSIS — I208 Other forms of angina pectoris: Secondary | ICD-10-CM | POA: Diagnosis not present

## 2017-11-21 DIAGNOSIS — J40 Bronchitis, not specified as acute or chronic: Secondary | ICD-10-CM | POA: Diagnosis not present

## 2017-11-22 DIAGNOSIS — R002 Palpitations: Secondary | ICD-10-CM | POA: Diagnosis not present

## 2017-11-22 DIAGNOSIS — M436 Torticollis: Secondary | ICD-10-CM | POA: Diagnosis not present

## 2017-11-22 DIAGNOSIS — J45909 Unspecified asthma, uncomplicated: Secondary | ICD-10-CM | POA: Diagnosis not present

## 2017-11-22 DIAGNOSIS — R0602 Shortness of breath: Secondary | ICD-10-CM | POA: Diagnosis not present

## 2017-11-22 DIAGNOSIS — M1990 Unspecified osteoarthritis, unspecified site: Secondary | ICD-10-CM | POA: Diagnosis not present

## 2017-11-22 DIAGNOSIS — I10 Essential (primary) hypertension: Secondary | ICD-10-CM | POA: Diagnosis not present

## 2017-11-22 DIAGNOSIS — E782 Mixed hyperlipidemia: Secondary | ICD-10-CM | POA: Diagnosis not present

## 2017-11-22 DIAGNOSIS — I208 Other forms of angina pectoris: Secondary | ICD-10-CM | POA: Diagnosis not present

## 2017-11-22 DIAGNOSIS — K219 Gastro-esophageal reflux disease without esophagitis: Secondary | ICD-10-CM | POA: Diagnosis not present

## 2017-11-28 ENCOUNTER — Ambulatory Visit: Payer: Medicare HMO | Admitting: Certified Registered"

## 2017-11-28 ENCOUNTER — Encounter
Admission: RE | Disposition: A | Discharge status: Home / Self Care | Payer: Self-pay | Source: Ambulatory Visit | Attending: Gastroenterology

## 2017-11-28 ENCOUNTER — Encounter: Payer: Self-pay | Admitting: Anesthesiology

## 2017-11-28 ENCOUNTER — Ambulatory Visit
Admission: RE | Admit: 2017-11-28 | Discharge: 2017-11-28 | Disposition: A | Discharge status: Home / Self Care | Payer: Medicare HMO | Source: Ambulatory Visit | Attending: Gastroenterology | Admitting: Gastroenterology

## 2017-11-28 DIAGNOSIS — K449 Diaphragmatic hernia without obstruction or gangrene: Secondary | ICD-10-CM | POA: Insufficient documentation

## 2017-11-28 DIAGNOSIS — K2960 Other gastritis without bleeding: Secondary | ICD-10-CM | POA: Diagnosis not present

## 2017-11-28 DIAGNOSIS — K228 Other specified diseases of esophagus: Secondary | ICD-10-CM | POA: Diagnosis not present

## 2017-11-28 DIAGNOSIS — K3189 Other diseases of stomach and duodenum: Secondary | ICD-10-CM | POA: Diagnosis not present

## 2017-11-28 DIAGNOSIS — Z7982 Long term (current) use of aspirin: Secondary | ICD-10-CM | POA: Diagnosis not present

## 2017-11-28 DIAGNOSIS — M1990 Unspecified osteoarthritis, unspecified site: Secondary | ICD-10-CM | POA: Insufficient documentation

## 2017-11-28 DIAGNOSIS — K2970 Gastritis, unspecified, without bleeding: Secondary | ICD-10-CM | POA: Diagnosis not present

## 2017-11-28 DIAGNOSIS — I471 Supraventricular tachycardia: Secondary | ICD-10-CM | POA: Insufficient documentation

## 2017-11-28 DIAGNOSIS — B3781 Candidal esophagitis: Secondary | ICD-10-CM | POA: Diagnosis not present

## 2017-11-28 DIAGNOSIS — K259 Gastric ulcer, unspecified as acute or chronic, without hemorrhage or perforation: Secondary | ICD-10-CM | POA: Diagnosis not present

## 2017-11-28 DIAGNOSIS — I2510 Atherosclerotic heart disease of native coronary artery without angina pectoris: Secondary | ICD-10-CM | POA: Diagnosis not present

## 2017-11-28 DIAGNOSIS — I10 Essential (primary) hypertension: Secondary | ICD-10-CM | POA: Insufficient documentation

## 2017-11-28 DIAGNOSIS — Z880 Allergy status to penicillin: Secondary | ICD-10-CM | POA: Insufficient documentation

## 2017-11-28 DIAGNOSIS — K219 Gastro-esophageal reflux disease without esophagitis: Secondary | ICD-10-CM | POA: Diagnosis not present

## 2017-11-28 DIAGNOSIS — E785 Hyperlipidemia, unspecified: Secondary | ICD-10-CM | POA: Insufficient documentation

## 2017-11-28 DIAGNOSIS — B379 Candidiasis, unspecified: Secondary | ICD-10-CM | POA: Diagnosis not present

## 2017-11-28 DIAGNOSIS — Z79899 Other long term (current) drug therapy: Secondary | ICD-10-CM | POA: Insufficient documentation

## 2017-11-28 DIAGNOSIS — Z882 Allergy status to sulfonamides status: Secondary | ICD-10-CM | POA: Insufficient documentation

## 2017-11-28 DIAGNOSIS — J45909 Unspecified asthma, uncomplicated: Secondary | ICD-10-CM | POA: Insufficient documentation

## 2017-11-28 DIAGNOSIS — K210 Gastro-esophageal reflux disease with esophagitis: Secondary | ICD-10-CM | POA: Diagnosis not present

## 2017-11-28 DIAGNOSIS — K2270 Barrett's esophagus without dysplasia: Secondary | ICD-10-CM | POA: Diagnosis not present

## 2017-11-28 DIAGNOSIS — K2950 Unspecified chronic gastritis without bleeding: Secondary | ICD-10-CM | POA: Diagnosis not present

## 2017-11-28 DIAGNOSIS — M436 Torticollis: Secondary | ICD-10-CM | POA: Insufficient documentation

## 2017-11-28 HISTORY — PX: ESOPHAGOGASTRODUODENOSCOPY (EGD) WITH PROPOFOL: SHX5813

## 2017-11-28 SURGERY — ESOPHAGOGASTRODUODENOSCOPY (EGD) WITH PROPOFOL
Anesthesia: General

## 2017-11-28 MED ORDER — GLYCOPYRROLATE 0.2 MG/ML IJ SOLN
INTRAMUSCULAR | Status: AC
  Filled 2017-11-28: qty 1

## 2017-11-28 MED ORDER — PROPOFOL 10 MG/ML IV BOLUS
INTRAVENOUS | Status: DC | PRN
  Administered 2017-11-28 (×2): 50 mg via INTRAVENOUS

## 2017-11-28 MED ORDER — PROPOFOL 500 MG/50ML IV EMUL
INTRAVENOUS | Status: AC
  Filled 2017-11-28: qty 50

## 2017-11-28 MED ORDER — PROPOFOL 500 MG/50ML IV EMUL
INTRAVENOUS | Status: DC | PRN
  Administered 2017-11-28: 120 ug/kg/min via INTRAVENOUS

## 2017-11-28 MED ORDER — SODIUM CHLORIDE 0.9 % IV SOLN
INTRAVENOUS | Status: DC
  Administered 2017-11-28: 1000 mL via INTRAVENOUS

## 2017-11-28 MED ORDER — SODIUM CHLORIDE 0.9 % IV SOLN: INTRAVENOUS | Status: DC

## 2017-11-28 MED ORDER — GLYCOPYRROLATE 0.2 MG/ML IJ SOLN
INTRAMUSCULAR | Status: DC | PRN
  Administered 2017-11-28: 0.2 mg via INTRAVENOUS

## 2017-11-28 MED ORDER — LIDOCAINE 2% (20 MG/ML) 5 ML SYRINGE
INTRAMUSCULAR | Status: DC | PRN
  Administered 2017-11-28: 25 mg via INTRAVENOUS

## 2017-11-28 NOTE — Anesthesia Postprocedure Evaluation (Signed)
Anesthesia Post Note  Patient: Mary Thornton  Procedure(s) Performed: ESOPHAGOGASTRODUODENOSCOPY (EGD) WITH PROPOFOL (N/A )  Patient location during evaluation: PACU Anesthesia Type: General Level of consciousness: awake and alert and oriented Pain management: pain level controlled Vital Signs Assessment: post-procedure vital signs reviewed and stable Respiratory status: spontaneous breathing Cardiovascular status: blood pressure returned to baseline Anesthetic complications: no     Last Vitals:  Vitals:   11/28/17 1210 11/28/17 1215  BP: (!) 130/53   Pulse: 98 (!) 46  Resp: (!) 22 18  Temp:    SpO2: (!) 85% 97%    Last Pain:  Vitals:   11/28/17 1147  TempSrc: Tympanic                 Vertie Dibbern

## 2017-11-28 NOTE — H&P (Signed)
Outpatient short stay form Pre-procedure 11/28/2017 11:16 AM [name] MD  Primary Physician: Dr. Fulton Reek  Reason for visit:  EGD  History of present illness:  Patient is a 77 year old female presenting today as above. She has personal history of Barrett's esophagus and is presenting for recheck. Also she does have some continued difficulty with difficulty swallowing. 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 On her last procedure she had no evidence of a ring or narrowing.We discussed this further today and we'll reevaluate in this regard.she takes no other aspirin products or blood thinning agents with the exception of an 81 mg aspirin daily. He last took that yesterday.    Current Facility-Administered Medications:  .  0.9 %  sodium chloride infusion, , Intravenous, Continuous, [name], MD, Last Rate: 20 mL/hr at 11/28/17 1003, 1,000 mL at 11/28/17 1003 .  0.9 %  sodium chloride infusion, , Intravenous, Continuous, [name], MD  Facility-Administered Medications Ordered in Other Encounters:  .  glycopyrrolate (ROBINUL) injection, , Intravenous, Anesthesia Intra-op, Rolla Plate, CRNA, 0.2 mg at 11/28/17 1115  Medications Prior to Admission  Medication Sig Dispense Refill Last Dose  . acetaminophen (TYLENOL) 325 MG tablet Take 325-650 mg by mouth every 6 (six) hours as needed for mild pain or moderate pain.    11/27/2017 at Unknown time  . aspirin 81 MG chewable tablet Chew 81 mg by mouth daily.   Past Week at Unknown time  . atorvastatin (LIPITOR) 40 MG tablet Take 1 tablet (40 mg total) by mouth daily. 30 tablet 0 11/27/2017 at Unknown time  . calcium carbonate (OS-CAL) 600 MG TABS tablet Take 600 mg by mouth daily.    Past Week at Unknown time  . CARTIA XT 180 MG 24 hr capsule Take 180 mg by mouth 2 (two) times daily.   11/28/2017 at 0545  . diclofenac (VOLTAREN) 75 MG EC tablet Take 75 mg by mouth 2 (two) times daily.   11/27/2017 at Unknown time  .  metoprolol tartrate (LOPRESSOR) 25 MG tablet Take 0.5 tablets (12.5 mg total) by mouth 2 (two) times daily. 30 tablet 0 11/28/2017 at 0545  . montelukast (SINGULAIR) 10 MG tablet Take 10 mg by mouth at bedtime.   11/27/2017 at Unknown time  . Multiple Vitamin (MULTIVITAMIN) tablet Take 1 tablet by mouth daily.   Past Week at Unknown time  . pantoprazole (PROTONIX) 40 MG tablet Take 40 mg by mouth daily.   11/27/2017 at Unknown time  . isosorbide mononitrate (IMDUR) 30 MG 24 hr tablet Take 1 tablet (30 mg total) by mouth daily for 7 days. 7 tablet 0   . loratadine (CLARITIN) 10 MG tablet Take 10 mg by mouth daily.   PRN at PRN     Allergies  Allergen Reactions  . Penicillins Itching and Rash    Has patient had a PCN reaction causing immediate rash, facial/tongue/throat swelling, SOB or lightheadedness with hypotension: Yes Has patient had a PCN reaction causing severe rash involving mucus membranes or skin necrosis: No Has patient had a PCN reaction that required hospitalization No Has patient had a PCN reaction occurring within the last 10 years: No If all of the above answers are "NO", then may proceed with Cephalosporin use.  . Sulfa Antibiotics Rash     Past Medical History:  Diagnosis Date  . Arthritis   . Asthma   . Coronary artery disease   . Environmental allergies   . Fibrocystic breast disease   . Hyperlipemia   .  Hypertension   . PSVT (paroxysmal supraventricular tachycardia) ([place])   . Reactive airway disease   . Recurrent sinusitis   . Torticollis     Review of systems:      Physical Exam    Heart and lungs: regular rate and rhythm without rub or gallop, lungs are bilaterally clear.    HEENT: normocephalic atraumatic eyes are anicteric    Other:     Pertinant exam for procedure: soft nontender nondistended bowel sounds positive normoactive    Planned proceedures: EGD and indicated procedures. I have discussed the risks benefits and complications of  procedures to include not limited to bleeding, infection, perforation and the risk of sedation and the patient wishes to proceed.    [name], MD Gastroenterology 11/28/2017  11:16 AM

## 2017-11-28 NOTE — Transfer of Care (Signed)
Immediate Anesthesia Transfer of Care Note  Patient: Mary Thornton  Procedure(s) Performed: ESOPHAGOGASTRODUODENOSCOPY (EGD) WITH PROPOFOL (N/A )  Patient Location: Endoscopy Unit  Anesthesia Type:General  Level of Consciousness: awake and alert   Airway & Oxygen Therapy: Patient Spontanous Breathing and Patient connected to nasal cannula oxygen  Post-op Assessment: Report given to RN and Post -op Vital signs reviewed and stable  Post vital signs: Reviewed  Last Vitals:  Vitals:   11/28/17 1147 11/28/17 1148  BP:  (!) 109/53  Pulse:  (!) 47  Resp:  16  Temp: (!) 36.2 C (!) 36.2 C  SpO2:  98%    Last Pain:  Vitals:   11/28/17 1147  TempSrc: Tympanic         Complications: No apparent anesthesia complications

## 2017-11-28 NOTE — Anesthesia Post-op Follow-up Note (Signed)
Anesthesia QCDR form completed.        

## 2017-11-28 NOTE — Anesthesia Preprocedure Evaluation (Addendum)
Anesthesia Evaluation  Patient identified by MRN, date of birth, ID band Patient awake    Reviewed: Allergy & Precautions, H&P , NPO status , Patient's Chart, lab work & pertinent test results, reviewed documented beta blocker date and time   History of Anesthesia Complications (+) PONV, PROLONGED EMERGENCE and history of anesthetic complications  Airway Mallampati: III  TM Distance: <3 FB Neck ROM: full  Mouth opening: Limited Mouth Opening  Dental no notable dental hx. (+) Teeth Intact   Pulmonary neg shortness of breath, asthma , neg sleep apnea, neg COPD, neg recent URI,    Pulmonary exam normal breath sounds clear to auscultation       Cardiovascular Exercise Tolerance: Good hypertension, Pt. on medications and Pt. on home beta blockers (-) angina+ CAD  (-) Past MI, (-) Cardiac Stents and (-) CABG Normal cardiovascular exam+ dysrhythmias Supra Ventricular Tachycardia (-) Valvular Problems/Murmurs Rhythm:regular Rate:Normal     Neuro/Psych neg Seizures  Neuromuscular disease (Torticulis) negative neurological ROS  negative psych ROS   GI/Hepatic negative GI ROS, Neg liver ROS, GERD  ,  Endo/Other  negative endocrine ROS  Renal/GU Renal diseasenegative Renal ROS Bladder dysfunction   negative genitourinary   Musculoskeletal  (+) Arthritis , Osteoarthritis,    Abdominal   Peds negative pediatric ROS (+)  Hematology negative hematology ROS (+)   Anesthesia Other Findings Past Medical History:   Coronary artery disease                                      Hypertension                                                 Arthritis                                                    Hyperlipemia                                                 Reactive airway disease                                      Environmental allergies                                      Recurrent sinusitis                                           Fibrocystic breast disease                                   Torticollis  PSVT (paroxysmal supraventricular tachycardia)               Reproductive/Obstetrics negative OB ROS                            Anesthesia Physical  Anesthesia Plan  ASA: III  Anesthesia Plan: General   Post-op Pain Management:    Induction: Intravenous  PONV Risk Score and Plan:   Airway Management Planned: Nasal Cannula  Additional Equipment:   Intra-op Plan:   Post-operative Plan:   Informed Consent: I have reviewed the patients History and Physical, chart, labs and discussed the procedure including the risks, benefits and alternatives for the proposed anesthesia with the patient or authorized representative who has indicated his/her understanding and acceptance.   Dental Advisory Given  Plan Discussed with: Anesthesiologist, CRNA and Surgeon  Anesthesia Plan Comments:         Anesthesia Quick Evaluation

## 2017-11-28 NOTE — Op Note (Signed)
[facility] Gastroenterology Patient Name: Mary Thornton Procedure Date: 11/28/2017 10:52 AM MRN: [id number] Account #: [id number] Date of Birth: 1941/10/24 Admit Type: Outpatient Age: 77 Room: [facility] ENDO ROOM 3 Gender: Female Note Status: Finalized Procedure:            Upper GI endoscopy Indications:          Dysphagia, Follow-up of Barrett's esophagus Providers:            [name], MD Referring MD:         Leonie Douglas. Doy Hutching, MD (Referring MD) Medicines:            Monitored Anesthesia Care Complications:        No immediate complications. Procedure:            Pre-Anesthesia Assessment:                       - ASA Grade Assessment: III - A patient with severe                        systemic disease.                       After obtaining informed consent, the endoscope was                        passed under direct vision. Throughout the procedure,                        the patient's blood pressure, pulse, and oxygen                        saturations were monitored continuously. The Endoscope                        was introduced through the mouth, and advanced to the                        third part of duodenum. The upper GI endoscopy was                        accomplished without difficulty. The patient tolerated                        the procedure well. Findings:      The Z-line was variable.      There were esophageal mucosal changes suggestive of short-segment       Barrett's esophagus present at the gastroesophageal junction. The       maximum longitudinal extent of these mucosal changes was 1 cm in length.       Mucosa was biopsied with a cold forceps for histology in 4 quadrants.      A small hiatal hernia was present.      There is no evidence of esophageal stenosis or ring.      Abnormal motility was noted in the middle third of the esophagus and in       the lower third of the esophagus. The cricopharyngeus was normal. There        are extra peristaltic waves in the esophageal body. The distal       esophagus/lower esophageal sphincter is open. Tertiary peristaltic waves  are noted.      Diffuse and patchy mild inflammation characterized by congestion (edema)       and erythema was found in the gastric body and in the gastric antrum.       Biopsies were taken with a cold forceps for histology. Biopsies were       taken with a cold forceps for Helicobacter pylori testing.      Two non-bleeding cratered gastric ulcers with no stigmata of bleeding       were found at the incisura. The largest lesion was 5 mm in largest       dimension. Biopsies were taken with a cold forceps for histology.      Mild inflammation was found in the second portion of the duodenum. Impression:           - Z-line variable.                       - Esophageal mucosal changes suggestive of                        short-segment Barrett's esophagus. Biopsied.                       - Small hiatal hernia. Recommendation:       - Discharge patient to home.                       - Use Protonix (pantoprazole) 40 mg PO BID for 2 months.                       - Use Protonix (pantoprazole) 40 mg PO daily daily.                       - Await pathology results.                       - Repeat upper endoscopy in 7 weeks to evaluate the                        response to therapy. Procedure Code(s):    --- Professional ---                       [phone], Esophagogastroduodenoscopy, flexible, transoral;                        with biopsy, single or multiple Diagnosis Code(s):    --- Professional ---                       K22.8, Other specified diseases of esophagus                       K22.70, Barrett's esophagus without dysplasia                       K44.9, Diaphragmatic hernia without obstruction or                        gangrene                       R13.10, Dysphagia, unspecified CPT copyright 2016 American Medical Association. All rights  reserved.  The codes documented in this report are preliminary and upon coder review may  be revised to meet current compliance requirements. [name], MD 11/28/2017 11:57:03 AM This report has been signed electronically. Number of Addenda: 0 Note Initiated On: 11/28/2017 10:52 AM      [facility]

## 2017-11-29 ENCOUNTER — Encounter: Payer: Self-pay | Admitting: Gastroenterology

## 2017-11-29 LAB — SURGICAL PATHOLOGY

## 2017-12-29 DIAGNOSIS — K2270 Barrett's esophagus without dysplasia: Secondary | ICD-10-CM | POA: Diagnosis not present

## 2017-12-29 DIAGNOSIS — K259 Gastric ulcer, unspecified as acute or chronic, without hemorrhage or perforation: Secondary | ICD-10-CM | POA: Diagnosis not present

## 2018-01-10 DIAGNOSIS — Z79899 Other long term (current) drug therapy: Secondary | ICD-10-CM | POA: Diagnosis not present

## 2018-01-10 DIAGNOSIS — D649 Anemia, unspecified: Secondary | ICD-10-CM | POA: Diagnosis not present

## 2018-01-10 DIAGNOSIS — I10 Essential (primary) hypertension: Secondary | ICD-10-CM | POA: Diagnosis not present

## 2018-01-12 DIAGNOSIS — H353131 Nonexudative age-related macular degeneration, bilateral, early dry stage: Secondary | ICD-10-CM | POA: Diagnosis not present

## 2018-01-12 DIAGNOSIS — H2513 Age-related nuclear cataract, bilateral: Secondary | ICD-10-CM | POA: Diagnosis not present

## 2018-01-12 DIAGNOSIS — H401131 Primary open-angle glaucoma, bilateral, mild stage: Secondary | ICD-10-CM | POA: Diagnosis not present

## 2018-01-17 DIAGNOSIS — M436 Torticollis: Secondary | ICD-10-CM | POA: Diagnosis not present

## 2018-01-17 DIAGNOSIS — E7800 Pure hypercholesterolemia, unspecified: Secondary | ICD-10-CM | POA: Diagnosis not present

## 2018-01-17 DIAGNOSIS — I10 Essential (primary) hypertension: Secondary | ICD-10-CM | POA: Diagnosis not present

## 2018-01-17 DIAGNOSIS — Z0000 Encounter for general adult medical examination without abnormal findings: Secondary | ICD-10-CM | POA: Diagnosis not present

## 2018-01-17 DIAGNOSIS — I2510 Atherosclerotic heart disease of native coronary artery without angina pectoris: Secondary | ICD-10-CM | POA: Diagnosis not present

## 2018-01-17 DIAGNOSIS — Z79899 Other long term (current) drug therapy: Secondary | ICD-10-CM | POA: Diagnosis not present

## 2018-01-17 DIAGNOSIS — I471 Supraventricular tachycardia: Secondary | ICD-10-CM | POA: Diagnosis not present

## 2018-01-18 ENCOUNTER — Ambulatory Visit: Payer: Medicare HMO | Admitting: Anesthesiology

## 2018-01-18 ENCOUNTER — Ambulatory Visit
Admission: RE | Admit: 2018-01-18 | Discharge: 2018-01-18 | Disposition: A | Discharge status: Home / Self Care | Payer: Medicare HMO | Source: Ambulatory Visit | Attending: Gastroenterology | Admitting: Gastroenterology

## 2018-01-18 ENCOUNTER — Encounter: Payer: Self-pay | Admitting: Anesthesiology

## 2018-01-18 ENCOUNTER — Encounter
Admission: RE | Disposition: A | Discharge status: Home / Self Care | Payer: Self-pay | Source: Ambulatory Visit | Attending: Gastroenterology

## 2018-01-18 DIAGNOSIS — K449 Diaphragmatic hernia without obstruction or gangrene: Secondary | ICD-10-CM | POA: Diagnosis not present

## 2018-01-18 DIAGNOSIS — J45909 Unspecified asthma, uncomplicated: Secondary | ICD-10-CM | POA: Insufficient documentation

## 2018-01-18 DIAGNOSIS — K210 Gastro-esophageal reflux disease with esophagitis: Secondary | ICD-10-CM | POA: Diagnosis not present

## 2018-01-18 DIAGNOSIS — I471 Supraventricular tachycardia: Secondary | ICD-10-CM | POA: Insufficient documentation

## 2018-01-18 DIAGNOSIS — Z882 Allergy status to sulfonamides status: Secondary | ICD-10-CM | POA: Insufficient documentation

## 2018-01-18 DIAGNOSIS — Z8719 Personal history of other diseases of the digestive system: Secondary | ICD-10-CM | POA: Diagnosis not present

## 2018-01-18 DIAGNOSIS — M436 Torticollis: Secondary | ICD-10-CM | POA: Diagnosis not present

## 2018-01-18 DIAGNOSIS — K2960 Other gastritis without bleeding: Secondary | ICD-10-CM | POA: Diagnosis not present

## 2018-01-18 DIAGNOSIS — Z880 Allergy status to penicillin: Secondary | ICD-10-CM | POA: Diagnosis not present

## 2018-01-18 DIAGNOSIS — Z79899 Other long term (current) drug therapy: Secondary | ICD-10-CM | POA: Diagnosis not present

## 2018-01-18 DIAGNOSIS — Z7982 Long term (current) use of aspirin: Secondary | ICD-10-CM | POA: Diagnosis not present

## 2018-01-18 DIAGNOSIS — K3189 Other diseases of stomach and duodenum: Secondary | ICD-10-CM | POA: Diagnosis not present

## 2018-01-18 DIAGNOSIS — K259 Gastric ulcer, unspecified as acute or chronic, without hemorrhage or perforation: Secondary | ICD-10-CM | POA: Insufficient documentation

## 2018-01-18 DIAGNOSIS — E785 Hyperlipidemia, unspecified: Secondary | ICD-10-CM | POA: Insufficient documentation

## 2018-01-18 DIAGNOSIS — K2950 Unspecified chronic gastritis without bleeding: Secondary | ICD-10-CM | POA: Insufficient documentation

## 2018-01-18 DIAGNOSIS — I10 Essential (primary) hypertension: Secondary | ICD-10-CM | POA: Insufficient documentation

## 2018-01-18 DIAGNOSIS — I2510 Atherosclerotic heart disease of native coronary artery without angina pectoris: Secondary | ICD-10-CM | POA: Diagnosis not present

## 2018-01-18 HISTORY — DX: Gastro-esophageal reflux disease without esophagitis: K21.9

## 2018-01-18 HISTORY — PX: ESOPHAGOGASTRODUODENOSCOPY (EGD) WITH PROPOFOL: SHX5813

## 2018-01-18 HISTORY — DX: Gastro-esophageal reflux disease with esophagitis: K21.0

## 2018-01-18 HISTORY — DX: Barrett's esophagus without dysplasia: K22.70

## 2018-01-18 HISTORY — DX: Hyperlipidemia, unspecified: E78.5

## 2018-01-18 HISTORY — DX: Gastro-esophageal reflux disease with esophagitis, without bleeding: K21.00

## 2018-01-18 HISTORY — DX: Diverticulosis of intestine, part unspecified, without perforation or abscess without bleeding: K57.90

## 2018-01-18 SURGERY — ESOPHAGOGASTRODUODENOSCOPY (EGD) WITH PROPOFOL
Anesthesia: General

## 2018-01-18 MED ORDER — SODIUM CHLORIDE 0.9 % IV SOLN: INTRAVENOUS | Status: DC

## 2018-01-18 MED ORDER — PROPOFOL 10 MG/ML IV BOLUS
INTRAVENOUS | Status: AC
  Filled 2018-01-18: qty 20

## 2018-01-18 MED ORDER — FENTANYL CITRATE (PF) 100 MCG/2ML IJ SOLN
INTRAMUSCULAR | Status: AC
  Filled 2018-01-18: qty 2

## 2018-01-18 MED ORDER — ONDANSETRON HCL 4 MG/2ML IJ SOLN
INTRAMUSCULAR | Status: AC
  Filled 2018-01-18: qty 2

## 2018-01-18 MED ORDER — FENTANYL CITRATE (PF) 100 MCG/2ML IJ SOLN
INTRAMUSCULAR | Status: DC | PRN
  Administered 2018-01-18: 50 ug via INTRAVENOUS

## 2018-01-18 MED ORDER — LIDOCAINE HCL (PF) 1 % IJ SOLN
2.0000 mL | Freq: Once | INTRAMUSCULAR | Status: AC
  Administered 2018-01-18: 0.3 mL via INTRADERMAL

## 2018-01-18 MED ORDER — PROPOFOL 500 MG/50ML IV EMUL
INTRAVENOUS | Status: DC | PRN
  Administered 2018-01-18: 100 ug/kg/min via INTRAVENOUS

## 2018-01-18 MED ORDER — ONDANSETRON HCL 4 MG/2ML IJ SOLN
INTRAMUSCULAR | Status: DC | PRN
  Administered 2018-01-18: 4 mg via INTRAVENOUS

## 2018-01-18 MED ORDER — LIDOCAINE HCL (PF) 2 % IJ SOLN
INTRAMUSCULAR | Status: AC
  Filled 2018-01-18: qty 10

## 2018-01-18 MED ORDER — LIDOCAINE HCL (CARDIAC) 20 MG/ML IV SOLN
INTRAVENOUS | Status: DC | PRN
  Administered 2018-01-18: 30 mg via INTRAVENOUS

## 2018-01-18 MED ORDER — SODIUM CHLORIDE 0.9 % IV SOLN
INTRAVENOUS | Status: DC
  Administered 2018-01-18: 1000 mL via INTRAVENOUS

## 2018-01-18 MED ORDER — LIDOCAINE HCL (PF) 1 % IJ SOLN
INTRAMUSCULAR | Status: AC
  Administered 2018-01-18: 0.3 mL via INTRADERMAL
  Filled 2018-01-18: qty 2

## 2018-01-18 MED ORDER — PROPOFOL 500 MG/50ML IV EMUL
INTRAVENOUS | Status: AC
  Filled 2018-01-18: qty 50

## 2018-01-18 NOTE — Transfer of Care (Signed)
Immediate Anesthesia Transfer of Care Note  Patient: Mary Thornton  Procedure(s) Performed: ESOPHAGOGASTRODUODENOSCOPY (EGD) WITH PROPOFOL (N/A )  Patient Location: PACU  Anesthesia Type:General  Level of Consciousness: awake and sedated  Airway & Oxygen Therapy: Patient Spontanous Breathing, Patient connected to nasal cannula oxygen and Patient connected to face mask oxygen  Post-op Assessment: Report given to RN and Post -op Vital signs reviewed and stable  Post vital signs: Reviewed and stable  Last Vitals:  Vitals Value Taken Time  BP    Temp    Pulse    Resp    SpO2      Last Pain:  Vitals:   01/18/18 1217  TempSrc: Tympanic         Complications: No apparent anesthesia complications

## 2018-01-18 NOTE — Op Note (Signed)
[facility] Gastroenterology Patient Name: Mary Thornton Procedure Date: 01/18/2018 12:17 PM MRN: [id number] Account #: [id number] Date of Birth: Jan 12, 1941 Admit Type: Outpatient Age: 77 Room: [facility] ENDO ROOM 1 Gender: Female Note Status: Finalized Procedure:            Upper GI endoscopy Indications:          Follow-up of gastric ulcer Providers:            [name], MD Referring MD:         Leonie Douglas. Doy Hutching, MD (Referring MD) Medicines:            Monitored Anesthesia Care Complications:        No immediate complications. Procedure:            Pre-Anesthesia Assessment:                       - ASA Grade Assessment: III - A patient with severe                        systemic disease.                       After obtaining informed consent, the endoscope was                        passed under direct vision. Throughout the procedure,                        the patient's blood pressure, pulse, and oxygen                        saturations were monitored continuously. The Endoscope                        was introduced through the mouth, and advanced to the                        third part of duodenum. The upper GI endoscopy was                        accomplished without difficulty. The patient tolerated                        the procedure well. Findings:      A small hiatal hernia was found. The Z-line was a variable distance from       incisors; the hiatal hernia was sliding.      The exam of the esophagus was otherwise normal.      The stomach was grossly normal throughout, except the incisura. This       area was improved in appearance, with one ulcer healed showing a small       scar at one spot, with some residual erythema also noted on the       incisura. Biopsies were taken of the area of erythema.      The exam of the stomach was otherwise normal. Biopsies were taken of the       antrum and body for histology and recheck for H. pylori.  The in the duodenum was normal. Impression:           - Small hiatal hernia.                       -  Normal. Recommendation:       - Discharge patient to home.                       - Await pathology results.                       - Continue present medications.                       - Use Protonix (pantoprazole) 40 mg PO BID for 2 weeks.                       - Use Protonix (pantoprazole) 40 mg PO daily daily.                       - Return to GI clinic in 6 weeks. Procedure Code(s):    --- Professional ---                       [phone], Esophagogastroduodenoscopy, flexible, transoral;                        diagnostic, including collection of specimen(s) by                        brushing or washing, when performed (separate procedure) Diagnosis Code(s):    --- Professional ---                       K44.9, Diaphragmatic hernia without obstruction or                        gangrene                       K25.9, Gastric ulcer, unspecified as acute or chronic,                        without hemorrhage or perforation CPT copyright 2016 American Medical Association. All rights reserved. The codes documented in this report are preliminary and upon coder review may  be revised to meet current compliance requirements. [name], MD 01/18/2018 1:27:37 PM This report has been signed electronically. Number of Addenda: 0 Note Initiated On: 01/18/2018 12:17 PM      [facility]

## 2018-01-18 NOTE — H&P (Signed)
Outpatient short stay form Pre-procedure 01/18/2018 12:21 PM Mary Sails MD  Primary Physician: Dr Fulton Reek  Reason for visit: EGD  History of present illness:   Patient is a 77 year old female presenting today as above.  She has a history of a EGD being done 11/28/2017 with a finding of several gastric ulcers along the incisura.  She was Helicobacter pylori negative.  She is presenting today for recheck on these.  She has no symptoms in regards to nausea vomiting or abdominal pain.  She continues to have some mild dysphagia associated with her presbyesophagus.  It is relatively unchanged.  He is currently taking a proton pump inhibitor twice a day.  She takes 81 mg of aspirin daily held today.  She takes no other aspirin products or blood thinning agent.  Does have a history of Barrett's esophagus however her last biopsies were on 11/28/2017.    Current Facility-Administered Medications:  .  0.9 %  sodium chloride infusion, , Intravenous, Continuous, Mary Sails, MD .  0.9 %  sodium chloride infusion, , Intravenous, Continuous, Mary Sails, MD .  lidocaine (PF) (XYLOCAINE) 1 % injection 2 mL, 2 mL, Intradermal, Once, Mary Sails, MD  Medications Prior to Admission  Medication Sig Dispense Refill Last Dose  . timolol (TIMOPTIC) 0.25 % ophthalmic solution Place 1 drop into both eyes daily.     Marland Kitchen tiZANidine (ZANAFLEX) 4 MG capsule Take 4 mg by mouth 3 (three) times daily as needed for muscle spasms.     Marland Kitchen acetaminophen (TYLENOL) 325 MG tablet Take 325-650 mg by mouth every 6 (six) hours as needed for mild pain or moderate pain.    11/27/2017 at Unknown time  . aspirin 81 MG chewable tablet Chew 81 mg by mouth daily.   01/17/2018  . atorvastatin (LIPITOR) 40 MG tablet Take 1 tablet (40 mg total) by mouth daily. 30 tablet 0 11/27/2017 at Unknown time  . calcium carbonate (OS-CAL) 600 MG TABS tablet Take 600 mg by mouth daily.    Past Week at Unknown time  . CARTIA XT  180 MG 24 hr capsule Take 180 mg by mouth 2 (two) times daily.   01/18/2018 at 0530  . diclofenac (VOLTAREN) 75 MG EC tablet Take 75 mg by mouth 2 (two) times daily.   11/27/2017 at Unknown time  . isosorbide mononitrate (IMDUR) 30 MG 24 hr tablet Take 1 tablet (30 mg total) by mouth daily for 7 days. 7 tablet 0   . loratadine (CLARITIN) 10 MG tablet Take 10 mg by mouth daily.   PRN at PRN  . metoprolol tartrate (LOPRESSOR) 25 MG tablet Take 0.5 tablets (12.5 mg total) by mouth 2 (two) times daily. 30 tablet 0 11/28/2017 at 0545  . montelukast (SINGULAIR) 10 MG tablet Take 10 mg by mouth at bedtime.   01/18/2018 at 0530  . Multiple Vitamin (MULTIVITAMIN) tablet Take 1 tablet by mouth daily.   Past Week at Unknown time  . pantoprazole (PROTONIX) 40 MG tablet Take 40 mg by mouth daily.   11/27/2017 at Unknown time     Allergies  Allergen Reactions  . Penicillins Itching and Rash    Has patient had a PCN reaction causing immediate rash, facial/tongue/throat swelling, SOB or lightheadedness with hypotension: Yes Has patient had a PCN reaction causing severe rash involving mucus membranes or skin necrosis: No Has patient had a PCN reaction that required hospitalization No Has patient had a PCN reaction occurring within the last  10 years: No If all of the above answers are "NO", then may proceed with Cephalosporin use.  . Sulfa Antibiotics Rash     Past Medical History:  Diagnosis Date  . Arthritis   . Asthma   . Barrett esophagus   . Coronary artery disease   . Diverticulosis   . Environmental allergies   . Fibrocystic breast disease   . GERD (gastroesophageal reflux disease)   . Hyperlipemia   . Hyperlipidemia   . Hypertension   . PSVT (paroxysmal supraventricular tachycardia) ([place])   . Reactive airway disease   . Recurrent sinusitis   . Reflux esophagitis   . Torticollis   . Torticollis     Review of systems:      Physical Exam    Heart and lungs: Regular rate and rhythm  without rub or gallop, lungs are bilaterally clear.    HEENT: Normocephalic atraumatic eyes are anicteric    Other:     Pertinant exam for procedure: Soft nontender nondistended bowel sounds positive normoactive    Planned proceedures: EGD and indicated procedures. I have discussed the risks benefits and complications of procedures to include not limited to bleeding, infection, perforation and the risk of sedation and the patient wishes to proceed.    Mary Sails, MD Gastroenterology 01/18/2018  12:21 PM

## 2018-01-18 NOTE — Anesthesia Procedure Notes (Signed)
Performed by: Cook-Martin, Toussaint Golson Pre-anesthesia Checklist: Patient identified, Emergency Drugs available, Suction available, Patient being monitored and Timeout performed Patient Re-evaluated:Patient Re-evaluated prior to induction Oxygen Delivery Method: Nasal cannula Preoxygenation: Pre-oxygenation with 100% oxygen Induction Type: IV induction Airway Equipment and Method: Bite block Placement Confirmation: positive ETCO2 and CO2 detector       

## 2018-01-18 NOTE — Anesthesia Postprocedure Evaluation (Signed)
Anesthesia Post Note  Patient: Mary Thornton  Procedure(s) Performed: ESOPHAGOGASTRODUODENOSCOPY (EGD) WITH PROPOFOL (N/A )  Patient location during evaluation: Endoscopy Anesthesia Type: General Level of consciousness: awake and alert Pain management: pain level controlled Vital Signs Assessment: post-procedure vital signs reviewed and stable Respiratory status: spontaneous breathing, nonlabored ventilation and respiratory function stable Cardiovascular status: blood pressure returned to baseline and stable Postop Assessment: no apparent nausea or vomiting Anesthetic complications: no     Last Vitals:  Vitals Value Taken Time  BP    Temp    Pulse 52 01/18/2018  2:01 PM  Resp 13 01/18/2018  2:01 PM  SpO2 96 % 01/18/2018  2:01 PM  Vitals shown include unvalidated device data.  Last Pain:  Vitals:   01/18/18 1334  TempSrc:   PainSc: 0-No pain                 Alphonsus Sias

## 2018-01-18 NOTE — Anesthesia Post-op Follow-up Note (Signed)
Anesthesia QCDR form completed.        

## 2018-01-18 NOTE — Anesthesia Preprocedure Evaluation (Addendum)
Anesthesia Evaluation  Patient identified by MRN, date of birth, ID band Patient awake    Reviewed: Allergy & Precautions, H&P , NPO status , reviewed documented beta blocker date and time   Airway Mallampati: III       Dental  (+) Teeth Intact   Pulmonary asthma ,    Pulmonary exam normal        Cardiovascular hypertension, + CAD  Normal cardiovascular exam     Neuro/Psych  Neuromuscular disease    GI/Hepatic GERD  ,  Endo/Other    Renal/GU Renal disease     Musculoskeletal  (+) Arthritis , Osteoarthritis,    Abdominal   Peds  Hematology   Anesthesia Other Findings PMH Coronary artery disease Hypertension PSVT (paroxysmal supraventricular tachycardia) ([facility])  Asthma        Arthritis Hyperlipemia Reactive airway disease Environmental allergies   Recurrent sinusitis Fibrocystic breast disease   Torticollis  PSH KNEE ARTHROSCOPY  Torticollis surgery x 2 JOINT REPLACEMENTESOPHAGOGASTRODUODENOSCOPY (EGD) WITH PROPOFOL  COLONOSCOPY WITH PROPOFOL ABDOMINAL HYSTERECTOMY BREAST BIOPSY    Reproductive/Obstetrics                           Anesthesia Physical Anesthesia Plan  ASA: III  Anesthesia Plan: General   Post-op Pain Management:    Induction:   PONV Risk Score and Plan: 3 and Propofol infusion  Airway Management Planned:   Additional Equipment:   Intra-op Plan:   Post-operative Plan:   Informed Consent: I have reviewed the patients History and Physical, chart, labs and discussed the procedure including the risks, benefits and alternatives for the proposed anesthesia with the patient or authorized representative who has indicated his/her understanding and acceptance.   Dental Advisory Given  Plan Discussed with: CRNA  Anesthesia Plan Comments:         Anesthesia Quick Evaluation

## 2018-01-19 ENCOUNTER — Encounter: Payer: Self-pay | Admitting: Gastroenterology

## 2018-01-19 LAB — SURGICAL PATHOLOGY

## 2018-02-22 DIAGNOSIS — H25013 Cortical age-related cataract, bilateral: Secondary | ICD-10-CM | POA: Diagnosis not present

## 2018-02-22 DIAGNOSIS — H2511 Age-related nuclear cataract, right eye: Secondary | ICD-10-CM | POA: Diagnosis not present

## 2018-02-22 DIAGNOSIS — H2513 Age-related nuclear cataract, bilateral: Secondary | ICD-10-CM | POA: Diagnosis not present

## 2018-02-22 DIAGNOSIS — H353132 Nonexudative age-related macular degeneration, bilateral, intermediate dry stage: Secondary | ICD-10-CM | POA: Diagnosis not present

## 2018-02-22 DIAGNOSIS — H401134 Primary open-angle glaucoma, bilateral, indeterminate stage: Secondary | ICD-10-CM | POA: Diagnosis not present

## 2018-03-06 DIAGNOSIS — Z8719 Personal history of other diseases of the digestive system: Secondary | ICD-10-CM | POA: Diagnosis not present

## 2018-03-06 DIAGNOSIS — K2270 Barrett's esophagus without dysplasia: Secondary | ICD-10-CM | POA: Diagnosis not present

## 2018-03-07 NOTE — Progress Notes (Signed)
[facility] Note  03/08/2018     CHIEF COMPLAINT Patient presents for Retina Evaluation   HISTORY OF PRESENT ILLNESS: Mary Thornton is a 77 y.o. female who presents to the clinic today for:   HPI    Retina Evaluation    In left eye.  Duration of 2 weeks.  Associated Symptoms Negative for Flashes, Blind Spot, Photophobia, Scalp Tenderness, Fever, Weight Loss, Jaw Claudication, Glare, Pain, Floaters, Distortion, Redness, Trauma, Shoulder/Hip pain and Fatigue.  Context:  distance vision, mid-range vision and near vision.  Treatments tried include no treatments.  I, the attending physician,  performed the HPI with the patient and updated documentation appropriately.          Comments    Pt presents today on the referral of Dr. Herbert Deaner for retinal clearance for cataract sx on left eye, pt states Dr. Herbert Deaner saw a spot on that eye and wanted it checked out before she had sx, pt states she is not having any flashes, floaters, pain, or wavy vision, pt is also a pt of Dr. Ellin Mayhew in [place], and was sent to Dr. Herbert Deaner through him for cat sx, pt has glaucoma OD and uses timolol once a day OU since August of 2018, pt denies being diabetic,        Last edited by Bernarda Caffey, MD on 03/12/2018 12:19 AM. (History)    Pt states she is here to have cataract sx clearance; Pt states she has trouble driving at night;   Referring physician: Monna Fam, MD [place], [place] [address]  HISTORICAL INFORMATION:   Selected notes from the MEDICAL RECORD NUMBER Referred by Dr. Monna Fam for surgical clearance/concern of PED OS LEE: 04.25.19 Derenda Mis) [BCVA: OD: 20/50+1 OS: 20/50-1] Ocular Hx-Non-exudative ARMD OU, Glaucoma, Cataract PMH-Heart disease, asthma, arthritis, HTN    CURRENT MEDICATIONS: Current Outpatient Medications (Ophthalmic Drugs)  Medication Sig  . timolol (TIMOPTIC) 0.25 % ophthalmic solution Place 1 drop into both eyes  daily.   No current facility-administered medications for this visit.  (Ophthalmic Drugs)   Current Outpatient Medications (Other)  Medication Sig  . acetaminophen (TYLENOL) 325 MG tablet Take 325-650 mg by mouth every 6 (six) hours as needed for mild pain or moderate pain.   Marland Kitchen aspirin 81 MG chewable tablet Chew 81 mg by mouth daily.  Marland Kitchen atorvastatin (LIPITOR) 40 MG tablet Take 1 tablet (40 mg total) by mouth daily.  Marland Kitchen atorvastatin (LIPITOR) 40 MG tablet   . calcium carbonate (OS-CAL) 600 MG TABS tablet Take 600 mg by mouth daily.   Marland Kitchen CARTIA XT 180 MG 24 hr capsule Take 180 mg by mouth 2 (two) times daily.  . diclofenac (VOLTAREN) 75 MG EC tablet Take 75 mg by mouth 2 (two) times daily.  . isosorbide mononitrate (IMDUR) 30 MG 24 hr tablet Take 1 tablet (30 mg total) by mouth daily for 7 days.  Marland Kitchen loratadine (CLARITIN) 10 MG tablet Take 10 mg by mouth daily.  . metoprolol tartrate (LOPRESSOR) 25 MG tablet Take 0.5 tablets (12.5 mg total) by mouth 2 (two) times daily.  . montelukast (SINGULAIR) 10 MG tablet Take 10 mg by mouth at bedtime.  . Multiple Vitamin (MULTIVITAMIN) tablet Take 1 tablet by mouth daily.  . pantoprazole (PROTONIX) 40 MG tablet Take 40 mg by mouth daily.  Marland Kitchen tiZANidine (ZANAFLEX) 4 MG capsule Take 4 mg by mouth 3 (three) times daily as needed for muscle spasms.  Marland Kitchen tiZANidine (  ZANAFLEX) 4 MG tablet    No current facility-administered medications for this visit.  (Other)      REVIEW OF SYSTEMS: ROS    Positive for: Cardiovascular, Eyes   Negative for: Constitutional, Gastrointestinal, Neurological, Skin, Genitourinary, Musculoskeletal, HENT, Endocrine, Respiratory, Psychiatric, Allergic/Imm, Heme/Lymph   Last edited by Debbrah Alar, COT on 03/08/2018  8:35 AM. (History)       ALLERGIES Allergies  Allergen Reactions  . Penicillins Itching and Rash    Has patient had a PCN reaction causing immediate rash, facial/tongue/throat swelling, SOB or lightheadedness with  hypotension: Yes Has patient had a PCN reaction causing severe rash involving mucus membranes or skin necrosis: No Has patient had a PCN reaction that required hospitalization No Has patient had a PCN reaction occurring within the last 10 years: No If all of the above answers are "NO", then may proceed with Cephalosporin use.  . Sulfa Antibiotics Rash    PAST MEDICAL HISTORY Past Medical History:  Diagnosis Date  . Arthritis   . Asthma   . Barrett esophagus   . Coronary artery disease   . Diverticulosis   . Environmental allergies   . Fibrocystic breast disease   . GERD (gastroesophageal reflux disease)   . Hyperlipemia   . Hyperlipidemia   . Hypertension   . PSVT (paroxysmal supraventricular tachycardia) ([place])   . Reactive airway disease   . Recurrent sinusitis   . Reflux esophagitis   . Torticollis   . Torticollis    Past Surgical History:  Procedure Laterality Date  . ABDOMINAL HYSTERECTOMY    . BREAST BIOPSY Left    neg  . COLONOSCOPY WITH PROPOFOL N/A 06/23/2015   Procedure: COLONOSCOPY WITH PROPOFOL;  Surgeon: [name], MD;  Location[facility] ENDOSCOPY;  Service: Endoscopy;  Laterality: N/A;  . ESOPHAGOGASTRODUODENOSCOPY (EGD) WITH PROPOFOL N/A 06/23/2015   Procedure: ESOPHAGOGASTRODUODENOSCOPY (EGD) WITH PROPOFOL;  Surgeon: [name], MD;  Location[facility] ENDOSCOPY;  Service: Endoscopy;  Laterality: N/A;  . ESOPHAGOGASTRODUODENOSCOPY (EGD) WITH PROPOFOL N/A 11/28/2017   Procedure: ESOPHAGOGASTRODUODENOSCOPY (EGD) WITH PROPOFOL;  Surgeon: [name], MD;  Location: [facility] ENDOSCOPY;  Service: Endoscopy;  Laterality: N/A;  . ESOPHAGOGASTRODUODENOSCOPY (EGD) WITH PROPOFOL N/A 01/18/2018   Procedure: ESOPHAGOGASTRODUODENOSCOPY (EGD) WITH PROPOFOL;  Surgeon: [name], MD;  Location: [facility] ENDOSCOPY;  Service: Endoscopy;  Laterality: N/A;  . JOINT REPLACEMENT Left 2009  . KNEE ARTHROSCOPY    . Torticollis surgery x 2      FAMILY HISTORY Family  History  Problem Relation Age of Onset  . Heart disease Mother   . Heart disease Sister   . Cancer Neg Hx   . Diabetes Neg Hx   . Breast cancer Neg Hx   . Amblyopia Neg Hx   . Blindness Neg Hx   . Cataracts Neg Hx   . Glaucoma Neg Hx   . Macular degeneration Neg Hx   . Retinal detachment Neg Hx   . Strabismus Neg Hx   . Retinitis pigmentosa Neg Hx     SOCIAL HISTORY Social History   Tobacco Use  . Smoking status: Never Smoker  . Smokeless tobacco: Never Used  Substance Use Topics  . Alcohol use: No  . Drug use: No         OPHTHALMIC EXAM:  Base Eye Exam    Visual Acuity (Snellen - Linear)      Right Left   Dist [place] 20/50 +2 20/50 -2   Dist ph [place] 20/40 +2 20/40 -  1       Tonometry (Tonopen, 8:47 AM)      Right Left   Pressure 17 15       Pupils      Dark Light Shape React APD   Right 5 3 Round Brisk None   Left 5 3 Round Brisk None       Visual Fields (Counting fingers)      Left Right    Full Full       Extraocular Movement      Right Left    Full, Ortho Full, Ortho       Neuro/Psych    Oriented x3:  Yes   Mood/Affect:  Normal       Dilation    Both eyes:  1.0% Mydriacyl, 2.5% Phenylephrine @ 8:47 AM        Slit Lamp and Fundus Exam    Slit Lamp Exam      Right Left   Lids/Lashes Dermatochalasis - upper lid Dermatochalasis - upper lid   Conjunctiva/Sclera White and quiet White and quiet   Cornea Mild Arcus Mild Arcus   Anterior Chamber Deep and quiet Deep and quiet   Iris Round and dilated Round and dilated   Lens 2-3+ Nuclear sclerosis, 2+ Cortical cataract, trace Posterior subcapsular cataract 2-3+ Nuclear sclerosis, 2+ Cortical cataract, Vacuoles   Vitreous Vitreous syneresis, Posterior vitreous detachment Vitreous syneresis, Posterior vitreous detachment       Fundus Exam      Right Left   Disc Pink and Sharp, Flat Choroidal nevus off disc spanning 1200 to 0130 -- no SRF or orange pigment, Pink and Sharp   C/D Ratio 0.3 0.2    Macula Flat, Blunted foveal reflex, Drusen, RPE mottling and clumping, No heme or edema Flat, Blunted foveal reflex, Drusen, +PED, No heme or edema   Vessels Vascular attenuation Vascular attenuation   Periphery Attached, Inferior cobblestoning Attached        Refraction    Manifest Refraction      Sphere Cylinder Axis Dist VA   Right +0.50 +0.50 180 20/40+1   Left +0.25 Sphere  20/40-1          IMAGING AND PROCEDURES  Imaging and Procedures for @TODAY @  OCT, Retina - OU - Both Eyes       Right Eye Quality was good. Central Foveal Thickness: 254. Progression has no prior data. Findings include normal foveal contour, no IRF, no SRF, outer retinal atrophy (Drusen with patchy ORA).   Left Eye Quality was borderline. Central Foveal Thickness: 314. Progression has no prior data. Findings include abnormal foveal contour, pigment epithelial detachment, no SRF, no IRF, retinal drusen .   Notes *Images captured and stored on drive  Diagnosis / Impression:  Non-Exudative ARMD OU  Clinical management:  See below  Abbreviations: NFP - Normal foveal profile. CME - cystoid macular edema. PED - pigment epithelial detachment. IRF - intraretinal fluid. SRF - subretinal fluid. EZ - ellipsoid zone. ERM - epiretinal membrane. ORA - outer retinal atrophy. ORT - outer retinal tubulation. SRHM - subretinal hyper-reflective material         Fluorescein Angiography [place] (Transit OS)       Right Eye Progression has no prior data. Early phase findings include staining. Mid/Late phase findings include staining.   Left Eye Progression has no prior data. Early phase findings include staining, blockage. Mid/Late phase findings include staining, blockage. Choroidal neovascularization is classic.   Notes Images stored on drive;  Impression: OD:  OS:                  ASSESSMENT/PLAN:    ICD-10-CM   1. Intermediate stage nonexudative age-related macular degeneration of  both eyes H35.3132 Fluorescein Angiography [place] (Transit OS)  2. 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 Retinal edema H35.81 OCT, Retina - OU - Both Eyes  5. Combined forms of age-related cataract of both eyes H25.813     1,2. Age related macular degeneration, non-exudative, both eyes  - The incidence, anatomy, and pathology of dry AMD, risk of progression, and the AREDS and AREDS 2 study including smoking risks discussed with patient.  - Recommend amsler grid monitoring  - OS with PED and +CNV, however no IRF/SRF at this time  - no tx indicated at this time, but high likelihood of conversion to exudative disease  - okay to proceed with cataract surgery OS from retina standpoint  - can offer anti-VEGF therapy if IRF/SRF develop  - f/u in 3 wks  3. Choroidal nevus OD-  - off disc spanning 1200 to 0130 - flat, no SRF, no orange pigment - baseline color Optos images obtained today - monitor  4. No retinal edema on exam or OCT  5. Combined form age-related cataract OU-  - The symptoms of cataract, surgical options, and treatments and risks were discussed with patient. - discussed diagnosis and progression - likely visually significant - under the expert care of Dr. Herbert Deaner - clear to proceed with cataract sx from retina standpoint    Ophthalmic Meds Ordered this visit:  No orders of the defined types were placed in this encounter.      Return in about 3 weeks (around 03/29/2018) for F/U Exu AMD OS, DFE, OCT.  There are no Patient Instructions on file for this visit.   Explained the diagnoses, plan, and follow up with the patient and they expressed understanding.  Patient expressed understanding of the importance of proper follow up care.   This document serves as a record of services personally performed by Gardiner Sleeper, MD, PhD. It was created on their behalf by Ernest Mallick, OA, an ophthalmic assistant.  The creation of this record is the provider's dictation and/or activities during the visit.    Electronically signed by: Ernest Mallick, OA  03/07/2018 12:39 AM    Gardiner Sleeper, M.D., Ph.D. Diseases & Surgery of the Retina and Vitreous [facility]   I have reviewed the above documentation for accuracy and completeness, and I agree with the above. Gardiner Sleeper, M.D., Ph.D. 03/12/18 12:51 AM       Abbreviations: M myopia (nearsighted); A astigmatism; H hyperopia (farsighted); P presbyopia; Mrx spectacle prescription;  CTL contact lenses; OD right eye; OS left eye; OU both eyes  XT exotropia; ET esotropia; PEK punctate epithelial keratitis; PEE punctate epithelial erosions; DES dry eye syndrome; MGD meibomian gland dysfunction; ATs artificial tears; PFAT's preservative free artificial tears; [place] nuclear sclerotic cataract; PSC posterior subcapsular cataract; ERM epi-retinal membrane; PVD posterior vitreous detachment; RD retinal detachment; DM diabetes mellitus; DR diabetic retinopathy; NPDR non-proliferative diabetic retinopathy; PDR proliferative diabetic retinopathy; CSME clinically significant macular edema; DME diabetic macular edema; dbh dot blot hemorrhages; CWS cotton wool spot; POAG primary open angle glaucoma; C/D cup-to-disc ratio; HVF humphrey visual field; GVF goldmann visual field; OCT optical coherence tomography; IOP intraocular pressure; BRVO Branch retinal vein occlusion;  CRVO central retinal vein occlusion; CRAO central retinal artery occlusion; BRAO branch retinal artery occlusion; RT retinal tear; SB scleral buckle; PPV pars plana vitrectomy; VH Vitreous hemorrhage; PRP panretinal laser photocoagulation; IVK intravitreal kenalog; VMT vitreomacular traction; MH Macular hole;  NVD neovascularization of the disc; NVE neovascularization elsewhere; AREDS age related eye disease study; ARMD age related macular degeneration; POAG primary open angle glaucoma;  EBMD epithelial/anterior basement membrane dystrophy; ACIOL anterior chamber intraocular lens; IOL intraocular lens; PCIOL posterior chamber intraocular lens; Phaco/IOL phacoemulsification with intraocular lens placement; [place] photorefractive keratectomy; LASIK laser assisted in situ keratomileusis; HTN hypertension; DM diabetes mellitus; COPD chronic obstructive pulmonary disease

## 2018-03-08 ENCOUNTER — Encounter (INDEPENDENT_AMBULATORY_CARE_PROVIDER_SITE_OTHER): Payer: Self-pay | Admitting: Ophthalmology

## 2018-03-08 ENCOUNTER — Ambulatory Visit (INDEPENDENT_AMBULATORY_CARE_PROVIDER_SITE_OTHER): Payer: Medicare HMO | Admitting: Ophthalmology

## 2018-03-08 DIAGNOSIS — H353132 Nonexudative age-related macular degeneration, bilateral, intermediate dry stage: Secondary | ICD-10-CM

## 2018-03-08 DIAGNOSIS — D3131 Benign neoplasm of right choroid: Secondary | ICD-10-CM | POA: Diagnosis not present

## 2018-03-08 DIAGNOSIS — H25813 Combined forms of age-related cataract, bilateral: Secondary | ICD-10-CM

## 2018-03-08 DIAGNOSIS — H3581 Retinal edema: Secondary | ICD-10-CM | POA: Diagnosis not present

## 2018-03-08 DIAGNOSIS — H35052 Retinal neovascularization, unspecified, left eye: Secondary | ICD-10-CM

## 2018-03-12 ENCOUNTER — Encounter (INDEPENDENT_AMBULATORY_CARE_PROVIDER_SITE_OTHER): Payer: Self-pay | Admitting: Ophthalmology

## 2018-03-13 DIAGNOSIS — H25811 Combined forms of age-related cataract, right eye: Secondary | ICD-10-CM | POA: Diagnosis not present

## 2018-03-13 DIAGNOSIS — H2511 Age-related nuclear cataract, right eye: Secondary | ICD-10-CM | POA: Diagnosis not present

## 2018-03-13 HISTORY — PX: CATARACT EXTRACTION: SUR2

## 2018-03-19 DIAGNOSIS — H2511 Age-related nuclear cataract, right eye: Secondary | ICD-10-CM | POA: Diagnosis not present

## 2018-03-29 NOTE — Progress Notes (Signed)
[facility] Note  03/30/2018     CHIEF COMPLAINT Patient presents for Retina Follow Up   HISTORY OF PRESENT ILLNESS: Mary Thornton is a 77 y.o. female who presents to the clinic today for:   HPI    Retina Follow Up    Patient presents with  Dry AMD.  In both eyes.  Severity is moderate.  Since onset it is stable.  I, the attending physician,  performed the HPI with the patient and updated documentation appropriately.          Comments    77 y/o female pt here for 3 wk f/u for intermediate stage nonexudative ARMD OU.  Pt had cat sx OD about 2 wks ago w/Dr. Herbert Deaner.  No change in vision ou since last visit.  Denies pain, flashes, floaters.  Has some glare occasionally OD.  Timoptic q am ou.  Ketorolac QID OD.  Prednisolone tid OD.          Last edited by Bernarda Caffey, MD on 03/30/2018 10:34 AM. (History)      Referring physician: Idelle Crouch, MD [address], [place] [address]  HISTORICAL INFORMATION:   Selected notes from the MEDICAL RECORD NUMBER Referred by Dr. Monna Fam for surgical clearance/concern of PED OS LEE: 04.25.19 Derenda Mis) [BCVA: OD: 20/50+1 OS: 20/50-1] Ocular Hx-Non-exudative ARMD OU, Glaucoma, Cataract PMH-Heart disease, asthma, arthritis, HTN    CURRENT MEDICATIONS: Current Outpatient Medications (Ophthalmic Drugs)  Medication Sig  . timolol (TIMOPTIC) 0.25 % ophthalmic solution Place 1 drop into both eyes daily.   No current facility-administered medications for this visit.  (Ophthalmic Drugs)   Current Outpatient Medications (Other)  Medication Sig  . acetaminophen (TYLENOL) 325 MG tablet Take 325-650 mg by mouth every 6 (six) hours as needed for mild pain or moderate pain.   Marland Kitchen aspirin 81 MG chewable tablet Chew 81 mg by mouth daily.  Marland Kitchen atorvastatin (LIPITOR) 40 MG tablet   . calcium carbonate (OS-CAL) 600 MG TABS tablet Take 600 mg by mouth daily.   Marland Kitchen CARTIA XT 180  MG 24 hr capsule Take 180 mg by mouth 2 (two) times daily.  . diclofenac (VOLTAREN) 75 MG EC tablet Take 75 mg by mouth 2 (two) times daily.  Marland Kitchen loratadine (CLARITIN) 10 MG tablet Take 10 mg by mouth daily.  . metoprolol tartrate (LOPRESSOR) 25 MG tablet Take 0.5 tablets (12.5 mg total) by mouth 2 (two) times daily.  . montelukast (SINGULAIR) 10 MG tablet Take 10 mg by mouth at bedtime.  . Multiple Vitamin (MULTIVITAMIN) tablet Take 1 tablet by mouth daily.  . pantoprazole (PROTONIX) 40 MG tablet Take 40 mg by mouth daily.  Marland Kitchen tiZANidine (ZANAFLEX) 4 MG capsule Take 4 mg by mouth 3 (three) times daily as needed for muscle spasms.  Marland Kitchen tiZANidine (ZANAFLEX) 4 MG tablet   . atorvastatin (LIPITOR) 40 MG tablet Take 1 tablet (40 mg total) by mouth daily.  . isosorbide mononitrate (IMDUR) 30 MG 24 hr tablet Take 1 tablet (30 mg total) by mouth daily for 7 days.   No current facility-administered medications for this visit.  (Other)      REVIEW OF SYSTEMS: ROS    Positive for: Eyes   Negative for: Constitutional, Gastrointestinal, Neurological, Skin, Genitourinary, Musculoskeletal, HENT, Endocrine, Cardiovascular, Respiratory, Psychiatric, Allergic/Imm, Heme/Lymph   Last edited by Matthew Folks, COA on 03/30/2018  9:57 AM. (History)       ALLERGIES  Allergies  Allergen Reactions  . Penicillins Itching and Rash    Has patient had a PCN reaction causing immediate rash, facial/tongue/throat swelling, SOB or lightheadedness with hypotension: Yes Has patient had a PCN reaction causing severe rash involving mucus membranes or skin necrosis: No Has patient had a PCN reaction that required hospitalization No Has patient had a PCN reaction occurring within the last 10 years: No If all of the above answers are "NO", then may proceed with Cephalosporin use.  . Sulfa Antibiotics Rash    PAST MEDICAL HISTORY Past Medical History:  Diagnosis Date  . Arthritis   . Asthma   . Barrett esophagus   .  Coronary artery disease   . Diverticulosis   . Environmental allergies   . Fibrocystic breast disease   . GERD (gastroesophageal reflux disease)   . Hyperlipemia   . Hyperlipidemia   . Hypertension   . PSVT (paroxysmal supraventricular tachycardia) ([place])   . Reactive airway disease   . Recurrent sinusitis   . Reflux esophagitis   . Torticollis   . Torticollis    Past Surgical History:  Procedure Laterality Date  . ABDOMINAL HYSTERECTOMY    . BREAST BIOPSY Left    neg  . CATARACT EXTRACTION Right 03/13/2018   Dr. Herbert Deaner  . COLONOSCOPY WITH PROPOFOL N/A 06/23/2015   Procedure: COLONOSCOPY WITH PROPOFOL;  Surgeon: [name], MD;  Location[facility] ENDOSCOPY;  Service: Endoscopy;  Laterality: N/A;  . ESOPHAGOGASTRODUODENOSCOPY (EGD) WITH PROPOFOL N/A 06/23/2015   Procedure: ESOPHAGOGASTRODUODENOSCOPY (EGD) WITH PROPOFOL;  Surgeon: [name], MD;  Location: [facility] ENDOSCOPY;  Service: Endoscopy;  Laterality: N/A;  . ESOPHAGOGASTRODUODENOSCOPY (EGD) WITH PROPOFOL N/A 11/28/2017   Procedure: ESOPHAGOGASTRODUODENOSCOPY (EGD) WITH PROPOFOL;  Surgeon: [name], MD;  Location: [facility] ENDOSCOPY;  Service: Endoscopy;  Laterality: N/A;  . ESOPHAGOGASTRODUODENOSCOPY (EGD) WITH PROPOFOL N/A 01/18/2018   Procedure: ESOPHAGOGASTRODUODENOSCOPY (EGD) WITH PROPOFOL;  Surgeon: [name], MD;  Location: [facility] ENDOSCOPY;  Service: Endoscopy;  Laterality: N/A;  . JOINT REPLACEMENT Left 2009  . KNEE ARTHROSCOPY    . Torticollis surgery x 2      FAMILY HISTORY Family History  Problem Relation Age of Onset  . Heart disease Mother   . Heart disease Sister   . Cancer Neg Hx   . Diabetes Neg Hx   . Breast cancer Neg Hx   . Amblyopia Neg Hx   . Blindness Neg Hx   . Cataracts Neg Hx   . Glaucoma Neg Hx   . Macular degeneration Neg Hx   . Retinal detachment Neg Hx   . Strabismus Neg Hx   . Retinitis pigmentosa Neg Hx     SOCIAL HISTORY Social History   Tobacco Use  .  Smoking status: Never Smoker  . Smokeless tobacco: Never Used  Substance Use Topics  . Alcohol use: No  . Drug use: No         OPHTHALMIC EXAM:  Base Eye Exam    Visual Acuity (Snellen - Linear)      Right Left   Dist [place] 20/30 - 20/30 -2   Dist ph [place] NI NI       Tonometry (Tonopen, 10:03 AM)      Right Left   Pressure 11 11       Pupils      Dark Light Shape React APD   Right 5 3 Round Brisk None   Left 5 3 Round Brisk None  Visual Fields (Counting fingers)      Left Right    Full Full       Extraocular Movement      Right Left    Full, Ortho Full, Ortho       Neuro/Psych    Oriented x3:  Yes   Mood/Affect:  Normal       Dilation    Both eyes:  1.0% Mydriacyl, 2.5% Phenylephrine @ 10:03 AM        Slit Lamp and Fundus Exam    Slit Lamp Exam      Right Left   Lids/Lashes Dermatochalasis - upper lid Dermatochalasis - upper lid   Conjunctiva/Sclera White and quiet White and quiet   Cornea Mild Arcus, Inferior 2+ Punctate epithelial erosions Mild Arcus   Anterior Chamber 0.5+ pigment Deep and quiet   Iris Round and dilated Round and dilated   Lens PC IOL in good position, mild PC folds 2-3+ Nuclear sclerosis, 2+ Cortical cataract, Vacuoles   Vitreous Vitreous syneresis, Posterior vitreous detachment Vitreous syneresis, Posterior vitreous detachment       Fundus Exam      Right Left   Disc Pink and Sharp, Flat Choroidal nevus off disc spanning 1200 to 0130 -- no SRF or orange pigment Pink and Sharp   C/D Ratio 0.3 0.3   Macula Flat, Blunted foveal reflex, Drusen, RPE mottling and clumping, No heme or edema Flat, Blunted foveal reflex, Drusen, +PED, No heme or edema   Vessels Vascular attenuation Vascular attenuation   Periphery Attached, Inferior cobblestoning Attached          IMAGING AND PROCEDURES  Imaging and Procedures for @TODAY @  OCT, Retina - OU - Both Eyes       Right Eye Quality was good. Central Foveal Thickness: 251.  Progression has been stable. Findings include normal foveal contour, no IRF, no SRF, outer retinal atrophy, retinal drusen  (Drusen with patchy ORA).   Left Eye Quality was borderline. Central Foveal Thickness: 311. Progression has been stable. Findings include abnormal foveal contour, pigment epithelial detachment, no IRF, retinal drusen  (Slight interval increase in PED).   Notes *Images captured and stored on drive  Diagnosis / Impression:  Non-Exudative ARMD OU  Clinical management:  See below  Abbreviations: NFP - Normal foveal profile. CME - cystoid macular edema. PED - pigment epithelial detachment. IRF - intraretinal fluid. SRF - subretinal fluid. EZ - ellipsoid zone. ERM - epiretinal membrane. ORA - outer retinal atrophy. ORT - outer retinal tubulation. SRHM - subretinal hyper-reflective material                  ASSESSMENT/PLAN:    ICD-10-CM   1. Intermediate stage nonexudative age-related macular degeneration of both eyes H35.3132   2. 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 Retinal edema H35.81 OCT, Retina - OU - Both Eyes  5. Combined forms of age-related cataract of left eye H25.812   6. Pseudophakia of right eye Z96.1     1,2. Age related macular degeneration, non-exudative, both eyes  - The incidence, anatomy, and pathology of dry AMD, risk of progression, and the AREDS and AREDS 2 study including smoking risks discussed with patient.  - Recommend amsler grid monitoring  - OS with PED and +CNV, stable today without no significant IRF/SRF at this time  - no tx indicated at this time, but will follow closely  - okay to  proceed with cataract surgery OS from retina standpoint  - can offer anti-VEGF therapy if IRF/SRF develops  - f/u 2-3 wks after cataract surgery OS (first week of July)  3. Choroidal nevus OD-  - off disc spanning 1200 to 0130 - flat, no SRF, no orange pigment - baseline color  Optos images obtained at initial visit - monitor  4. No retinal edema on exam or OCT  5. Combined form age-related cataract OS-  - The symptoms of cataract, surgical options, and treatments and risks were discussed with patient. - discussed diagnosis and progression - likely visually significant - under the expert care of Dr. Herbert Deaner - clear to proceed with cataract sx from retina standpoint  6. Pseudophakia OD  - s/p CE/IOL done on May 14th by expert surgeon Dr. Herbert Deaner  - beautiful surgery, doing well  - monitor    Ophthalmic Meds Ordered this visit:  No orders of the defined types were placed in this encounter.      Return in about 1 month (around 05/01/2018) for F/U Non-Exu ARMD OU, DFE, OCT.  There are no Patient Instructions on file for this visit.   Explained the diagnoses, plan, and follow up with the patient and they expressed understanding.  Patient expressed understanding of the importance of proper follow up care.   This document serves as a record of services personally performed by Gardiner Sleeper, MD, PhD. It was created on their behalf by Catha Brow, [place], a certified ophthalmic assistant. The creation of this record is the provider's dictation and/or activities during the visit.  Electronically signed by: Catha Brow, [place]  05.30.19 2:45 PM   Gardiner Sleeper, M.D., Ph.D. Diseases & Surgery of the Retina and Vitreous [facility]   I have reviewed the above documentation for accuracy and completeness, and I agree with the above. Gardiner Sleeper, M.D., Ph.D. 03/30/18 2:47 PM     Abbreviations: M myopia (nearsighted); A astigmatism; H hyperopia (farsighted); P presbyopia; Mrx spectacle prescription;  CTL contact lenses; OD right eye; OS left eye; OU both eyes  XT exotropia; ET esotropia; PEK punctate epithelial keratitis; PEE punctate epithelial erosions; DES dry eye syndrome; MGD meibomian gland dysfunction; ATs artificial tears;  PFAT's preservative free artificial tears; [place] nuclear sclerotic cataract; PSC posterior subcapsular cataract; ERM epi-retinal membrane; PVD posterior vitreous detachment; RD retinal detachment; DM diabetes mellitus; DR diabetic retinopathy; NPDR non-proliferative diabetic retinopathy; PDR proliferative diabetic retinopathy; CSME clinically significant macular edema; DME diabetic macular edema; dbh dot blot hemorrhages; CWS cotton wool spot; POAG primary open angle glaucoma; C/D cup-to-disc ratio; HVF humphrey visual field; GVF goldmann visual field; OCT optical coherence tomography; IOP intraocular pressure; BRVO Branch retinal vein occlusion; CRVO central retinal vein occlusion; CRAO central retinal artery occlusion; BRAO branch retinal artery occlusion; RT retinal tear; SB scleral buckle; PPV pars plana vitrectomy; VH Vitreous hemorrhage; PRP panretinal laser photocoagulation; IVK intravitreal kenalog; VMT vitreomacular traction; MH Macular hole;  NVD neovascularization of the disc; NVE neovascularization elsewhere; AREDS age related eye disease study; ARMD age related macular degeneration; POAG primary open angle glaucoma; EBMD epithelial/anterior basement membrane dystrophy; ACIOL anterior chamber intraocular lens; IOL intraocular lens; PCIOL posterior chamber intraocular lens; Phaco/IOL phacoemulsification with intraocular lens placement; [place] photorefractive keratectomy; LASIK laser assisted in situ keratomileusis; HTN hypertension; DM diabetes mellitus; COPD chronic obstructive pulmonary disease

## 2018-03-30 ENCOUNTER — Encounter (INDEPENDENT_AMBULATORY_CARE_PROVIDER_SITE_OTHER): Payer: Self-pay | Admitting: Ophthalmology

## 2018-03-30 ENCOUNTER — Ambulatory Visit (INDEPENDENT_AMBULATORY_CARE_PROVIDER_SITE_OTHER): Payer: Medicare HMO | Admitting: Ophthalmology

## 2018-03-30 DIAGNOSIS — Z961 Presence of intraocular lens: Secondary | ICD-10-CM

## 2018-03-30 DIAGNOSIS — H35052 Retinal neovascularization, unspecified, left eye: Secondary | ICD-10-CM | POA: Diagnosis not present

## 2018-03-30 DIAGNOSIS — D3131 Benign neoplasm of right choroid: Secondary | ICD-10-CM

## 2018-03-30 DIAGNOSIS — H353132 Nonexudative age-related macular degeneration, bilateral, intermediate dry stage: Secondary | ICD-10-CM | POA: Diagnosis not present

## 2018-03-30 DIAGNOSIS — H25812 Combined forms of age-related cataract, left eye: Secondary | ICD-10-CM | POA: Diagnosis not present

## 2018-03-30 DIAGNOSIS — H3581 Retinal edema: Secondary | ICD-10-CM | POA: Diagnosis not present

## 2018-04-02 ENCOUNTER — Telehealth (INDEPENDENT_AMBULATORY_CARE_PROVIDER_SITE_OTHER): Payer: Self-pay | Admitting: Ophthalmology

## 2018-04-03 NOTE — Telephone Encounter (Signed)
That is correct. She can follow up 2-3 wks after her cataract surgery with Dr. Herbert Deaner. Should be around mid to late July.  Gardiner Sleeper, M.D., Ph.D. Diseases & Surgery of the Retina and [place]

## 2018-04-16 DIAGNOSIS — I10 Essential (primary) hypertension: Secondary | ICD-10-CM | POA: Diagnosis not present

## 2018-04-16 DIAGNOSIS — E7800 Pure hypercholesterolemia, unspecified: Secondary | ICD-10-CM | POA: Diagnosis not present

## 2018-04-16 DIAGNOSIS — Z79899 Other long term (current) drug therapy: Secondary | ICD-10-CM | POA: Diagnosis not present

## 2018-04-25 DIAGNOSIS — I10 Essential (primary) hypertension: Secondary | ICD-10-CM | POA: Diagnosis not present

## 2018-04-25 DIAGNOSIS — E7800 Pure hypercholesterolemia, unspecified: Secondary | ICD-10-CM | POA: Diagnosis not present

## 2018-04-25 DIAGNOSIS — I2510 Atherosclerotic heart disease of native coronary artery without angina pectoris: Secondary | ICD-10-CM | POA: Diagnosis not present

## 2018-04-27 ENCOUNTER — Encounter (INDEPENDENT_AMBULATORY_CARE_PROVIDER_SITE_OTHER): Payer: Medicare HMO | Admitting: Ophthalmology

## 2018-05-23 ENCOUNTER — Encounter: Payer: Self-pay | Admitting: Emergency Medicine

## 2018-05-23 ENCOUNTER — Emergency Department
Admission: EM | Admit: 2018-05-23 | Discharge: 2018-05-23 | Disposition: A | Discharge status: Home / Self Care | Payer: Medicare HMO | Attending: Emergency Medicine | Admitting: Emergency Medicine

## 2018-05-23 ENCOUNTER — Other Ambulatory Visit: Payer: Self-pay

## 2018-05-23 DIAGNOSIS — Z7982 Long term (current) use of aspirin: Secondary | ICD-10-CM | POA: Insufficient documentation

## 2018-05-23 DIAGNOSIS — H44001 Unspecified purulent endophthalmitis, right eye: Secondary | ICD-10-CM | POA: Diagnosis not present

## 2018-05-23 DIAGNOSIS — I10 Essential (primary) hypertension: Secondary | ICD-10-CM | POA: Diagnosis not present

## 2018-05-23 DIAGNOSIS — I2510 Atherosclerotic heart disease of native coronary artery without angina pectoris: Secondary | ICD-10-CM | POA: Diagnosis not present

## 2018-05-23 DIAGNOSIS — H5789 Other specified disorders of eye and adnexa: Secondary | ICD-10-CM | POA: Diagnosis not present

## 2018-05-23 DIAGNOSIS — H10021 Other mucopurulent conjunctivitis, right eye: Secondary | ICD-10-CM | POA: Diagnosis present

## 2018-05-23 DIAGNOSIS — J45909 Unspecified asthma, uncomplicated: Secondary | ICD-10-CM | POA: Insufficient documentation

## 2018-05-23 DIAGNOSIS — Z79899 Other long term (current) drug therapy: Secondary | ICD-10-CM | POA: Insufficient documentation

## 2018-05-23 DIAGNOSIS — H04301 Unspecified dacryocystitis of right lacrimal passage: Secondary | ICD-10-CM

## 2018-05-23 MED ORDER — FLUORESCEIN SODIUM 1 MG OP STRP
ORAL_STRIP | OPHTHALMIC | Status: AC
  Administered 2018-05-23: 10:00:00
  Filled 2018-05-23: qty 1

## 2018-05-23 MED ORDER — GENTAMICIN SULFATE 0.3 % OP SOLN: 1.0000 [drp] | OPHTHALMIC | 0 refills | Status: DC

## 2018-05-23 MED ORDER — TETRACAINE HCL 0.5 % OP SOLN
OPHTHALMIC | Status: AC
  Administered 2018-05-23: 10:00:00
  Filled 2018-05-23: qty 4

## 2018-05-23 NOTE — ED Provider Notes (Signed)
[facility] Emergency Department Provider Note   ____________________________________________   First MD Initiated Contact with Patient 05/23/18 [phone]     (approximate)  I have reviewed the triage vital signs and the nursing notes.   HISTORY  Chief Complaint Eye Problem    HPI Mary Thornton is a 77 y.o. female patient complained of increased tearing of the right eye.  Patient states similar complaint a couple weeks ago and was seen by her optometrist and treated with eyedrops.  Patient states she believes is bleeding inside her eye.  Patient states she has experienced draining from the eye into the side of her nose.  Patient denies vision loss.  Recent had cataract surgery on 03/13/2018.   Past Medical History:  Diagnosis Date  . Arthritis   . Asthma   . Barrett esophagus   . Coronary artery disease   . Diverticulosis   . Environmental allergies   . Fibrocystic breast disease   . GERD (gastroesophageal reflux disease)   . Hyperlipemia   . Hyperlipidemia   . Hypertension   . PSVT (paroxysmal supraventricular tachycardia) ([place])   . Reactive airway disease   . Recurrent sinusitis   . Reflux esophagitis   . Torticollis   . Torticollis     Patient Active Problem List   Diagnosis Date Noted  . Chest pain 10/30/2017  . Cystocele 06/15/2016  . Vaginal atrophy 06/15/2016  . Unstable bladder 06/15/2016  . ARF (acute renal failure) ([place]) 05/14/2016    Past Surgical History:  Procedure Laterality Date  . ABDOMINAL HYSTERECTOMY    . BREAST BIOPSY Left    neg  . CATARACT EXTRACTION Right 03/13/2018   Dr. Herbert Deaner  . COLONOSCOPY WITH PROPOFOL N/A 06/23/2015   Procedure: COLONOSCOPY WITH PROPOFOL;  Surgeon: [name], MD;  Location: [facility] ENDOSCOPY;  Service: Endoscopy;  Laterality: N/A;  . ESOPHAGOGASTRODUODENOSCOPY (EGD) WITH PROPOFOL N/A 06/23/2015   Procedure: ESOPHAGOGASTRODUODENOSCOPY (EGD) WITH PROPOFOL;  Surgeon: [name], MD;  Location: [facility] ENDOSCOPY;  Service: Endoscopy;  Laterality: N/A;  . ESOPHAGOGASTRODUODENOSCOPY (EGD) WITH PROPOFOL N/A 11/28/2017   Procedure: ESOPHAGOGASTRODUODENOSCOPY (EGD) WITH PROPOFOL;  Surgeon: [name], MD;  Location: [facility] ENDOSCOPY;  Service: Endoscopy;  Laterality: N/A;  . ESOPHAGOGASTRODUODENOSCOPY (EGD) WITH PROPOFOL N/A 01/18/2018   Procedure: ESOPHAGOGASTRODUODENOSCOPY (EGD) WITH PROPOFOL;  Surgeon: [name], MD;  Location: [facility] ENDOSCOPY;  Service: Endoscopy;  Laterality: N/A;  . JOINT REPLACEMENT Left 2009  . KNEE ARTHROSCOPY    . Torticollis surgery x 2      Prior to Admission medications   Medication Sig Start Date End Date Taking? Authorizing Provider  acetaminophen (TYLENOL) 325 MG tablet Take 325-650 mg by mouth every 6 (six) hours as needed for mild pain or moderate pain.     [provider]  aspirin 81 MG chewable tablet Chew 81 mg by mouth daily.    [provider]  atorvastatin (LIPITOR) 40 MG tablet Take 1 tablet (40 mg total) by mouth daily. 10/31/17 11/30/17  Vaughan Basta, MD  atorvastatin (LIPITOR) 40 MG tablet  01/24/18   [provider]  calcium carbonate (OS-CAL) 600 MG TABS tablet Take 600 mg by mouth daily.     [provider]  CARTIA XT 180 MG 24 hr capsule Take 180 mg by mouth 2 (two) times daily. 03/03/16   [provider]  diclofenac (VOLTAREN) 75 MG EC tablet Take 75 mg by mouth 2 (two) times daily.    [provider]  gentamicin (GARAMYCIN) 0.3 % ophthalmic solution Place 1 drop into the right eye every 4 (four) hours. 05/23/18   Sable Feil, PA-C  isosorbide mononitrate (IMDUR) 30 MG 24 hr tablet Take 1 tablet (30 mg total) by mouth daily for 7 days. 10/31/17 11/07/17  Vaughan Basta, MD  loratadine (CLARITIN) 10 MG tablet Take 10 mg by mouth daily.    [provider]  metoprolol tartrate (LOPRESSOR) 25 MG tablet Take 0.5 tablets (12.5 mg total) by  mouth 2 (two) times daily. 10/31/17 10/31/18  Vaughan Basta, MD  montelukast (SINGULAIR) 10 MG tablet Take 10 mg by mouth at bedtime.    [provider]  Multiple Vitamin (MULTIVITAMIN) tablet Take 1 tablet by mouth daily.    [provider]  pantoprazole (PROTONIX) 40 MG tablet Take 40 mg by mouth daily. 09/25/17   [provider]  timolol (TIMOPTIC) 0.25 % ophthalmic solution Place 1 drop into both eyes daily.    [provider]  tiZANidine (ZANAFLEX) 4 MG capsule Take 4 mg by mouth 3 (three) times daily as needed for muscle spasms.    [provider]  tiZANidine (ZANAFLEX) 4 MG tablet  12/11/17   [provider]    Allergies Penicillins and Sulfa antibiotics  Family History  Problem Relation Age of Onset  . Heart disease Mother   . Heart disease Sister   . Cancer Neg Hx   . Diabetes Neg Hx   . Breast cancer Neg Hx   . Amblyopia Neg Hx   . Blindness Neg Hx   . Cataracts Neg Hx   . Glaucoma Neg Hx   . Macular degeneration Neg Hx   . Retinal detachment Neg Hx   . Strabismus Neg Hx   . Retinitis pigmentosa Neg Hx     Social History Social History   Tobacco Use  . Smoking status: Never Smoker  . Smokeless tobacco: Never Used  Substance Use Topics  . Alcohol use: No  . Drug use: No    Review of Systems Constitutional: No fever/chills Eyes: No visual changes.  Increased tearing. ENT: No sore throat. Cardiovascular: Denies chest pain. Respiratory: Denies shortness of breath. Gastrointestinal: No abdominal pain.  No nausea, no vomiting.  No diarrhea.  No constipation. Genitourinary: Negative for dysuria. Musculoskeletal: Negative for back pain. Skin: Negative for rash. Neurological: Negative for headaches, focal weakness or numbness. Endocrine:Hyperlipidemia and hypertension. Allergic/Immunilogical: Penicillin and sulfa. ____________________________________________   PHYSICAL EXAM:  VITAL SIGNS: ED Triage  Vitals [05/23/18 0834]  Enc Vitals Group     BP (!) 167/73     Pulse Rate (!) 59     Resp 16     Temp (!) 97.4 F (36.3 C)     Temp Source Oral     SpO2 97 %     Weight 120 lb (54.4 kg)     Height 5\' 3"  (1.6 m)     Head Circumference      Peak Flow      Pain Score 0     Pain Loc      Pain Edu?      Excl. in [place]?    Constitutional: Alert and oriented. Well appearing and in no acute distress. Eyes: Conjunctivae are normal. PERRL. EOMI. Nose: No congestion/rhinnorhea. Cardiovascular: Normal rate, regular rhythm. Grossly normal heart sounds.  Good peripheral circulation. Respiratory: Normal respiratory effort.  No retractions. Lungs CTAB. Neurologic:  Normal speech and language. No gross focal neurologic deficits are appreciated. No  gait instability. Skin:  Skin is warm, dry and intact. No rash noted. Psychiatric: Mood and affect are normal. Speech and behavior are normal.  ____________________________________________   LABS (all labs ordered are listed, but only abnormal results are displayed)  Labs Reviewed - No data to display ____________________________________________  EKG   ____________________________________________  RADIOLOGY  ED MD interpretation:    Official radiology report(s): No results found.  ____________________________________________   PROCEDURES  Procedure(s) performed:   Procedures  Critical Care performed: No  ____________________________________________   INITIAL IMPRESSION / ASSESSMENT AND PLAN / ED COURSE  As part of my medical decision making, I reviewed the following data within the electronic MEDICAL RECORD NUMBER    Increase tearing secondary to blocked lacrimal duct.  Patient placed on prophylactic eyedrops and advised to follow-up ophthalmology for the next 2 days.      ____________________________________________   FINAL CLINICAL IMPRESSION(S) / ED DIAGNOSES  Final diagnoses:  Infection of right lacrimal duct      ED Discharge Orders        Ordered    gentamicin (GARAMYCIN) 0.3 % ophthalmic solution  Every 4 hours     05/23/18 0941       Note:  This document was prepared using Dragon voice recognition software and may include unintentional dictation errors.    Sable Feil, PA-C 05/23/18 [id number]    Harvest Dark, MD 05/23/18 1204

## 2018-05-23 NOTE — ED Notes (Signed)
Pt states she had contaract surgery to the right eye may 14, states two weeks ago she had swelling and they treated her for infection and duct blockage, states it got a little better but never completely went down. States on Monday she was weeding her yard and felt a pop sensation and now "feels like im bleeding inside". Sensation of dripping down the right side of face with swelling around the eye noted.

## 2018-05-23 NOTE — Discharge Instructions (Addendum)
Follow-up with [facility] or your personal ophthalmologist within 2  days.  Use eyedrops as directed.  Prescription can be picked up at your pharmacy.

## 2018-05-23 NOTE — ED Triage Notes (Signed)
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  Had cataract surgery 03/13/18 and this has happened before and she saw Dr. Ellin Mayhew for it.  Reddened area inside lower lid.  No redness noted of sclera.

## 2018-05-23 NOTE — ED Triage Notes (Signed)
Triage note reviewed with patient, she states that "it feels like something is bleeding inside".  Some swelling noted of right lower lid.

## 2018-05-24 DIAGNOSIS — H0012 Chalazion right lower eyelid: Secondary | ICD-10-CM | POA: Diagnosis not present

## 2018-05-30 ENCOUNTER — Encounter: Payer: Self-pay | Admitting: Emergency Medicine

## 2018-05-30 ENCOUNTER — Emergency Department
Admission: EM | Admit: 2018-05-30 | Discharge: 2018-05-30 | Disposition: A | Discharge status: Home / Self Care | Payer: Medicare HMO | Attending: Student in an Organized Health Care Education/Training Program | Admitting: Student in an Organized Health Care Education/Training Program

## 2018-05-30 ENCOUNTER — Emergency Department: Payer: Medicare HMO

## 2018-05-30 DIAGNOSIS — Z79899 Other long term (current) drug therapy: Secondary | ICD-10-CM | POA: Insufficient documentation

## 2018-05-30 DIAGNOSIS — I2510 Atherosclerotic heart disease of native coronary artery without angina pectoris: Secondary | ICD-10-CM | POA: Insufficient documentation

## 2018-05-30 DIAGNOSIS — J45909 Unspecified asthma, uncomplicated: Secondary | ICD-10-CM | POA: Diagnosis not present

## 2018-05-30 DIAGNOSIS — H5789 Other specified disorders of eye and adnexa: Secondary | ICD-10-CM | POA: Diagnosis not present

## 2018-05-30 DIAGNOSIS — Z7982 Long term (current) use of aspirin: Secondary | ICD-10-CM | POA: Insufficient documentation

## 2018-05-30 DIAGNOSIS — I10 Essential (primary) hypertension: Secondary | ICD-10-CM | POA: Diagnosis not present

## 2018-05-30 DIAGNOSIS — R220 Localized swelling, mass and lump, head: Secondary | ICD-10-CM | POA: Diagnosis present

## 2018-05-30 DIAGNOSIS — R51 Headache: Secondary | ICD-10-CM | POA: Diagnosis not present

## 2018-05-30 MED ORDER — LORATADINE 10 MG PO TABS: 10.0000 mg | ORAL_TABLET | Freq: Every day | ORAL | 0 refills | Status: AC

## 2018-05-30 NOTE — ED Notes (Signed)
Pt sitting up on side of bed with no distress noted; Pt st "I feel bleeding in my head running on down my face"; st here last wk and dx with eye infection "but I told them I could feel bleeding then"; pt denies pain, denies any injury; pt A&Ox3, PERRL, MAEW

## 2018-05-30 NOTE — ED Provider Notes (Signed)
[facility] Emergency Department Provider Note    First MD Initiated Contact with Patient 05/30/18 [phone]     (approximate)  I have reviewed the triage vital signs and the nursing notes.   HISTORY  Chief Complaint Headache    HPI Mary Thornton is a 77 y.o. female   presents the ER for evaluation of eye puffiness and sensation of "blood running down her face ".  Patient has not seen any bleeding but does feel some trickling down under her eyes.  Denies any headache.  Denies any blurry vision numbness or tingling.  This is been going on for over 1 week since she was seen here and started on antibiotics for conjunctivitis.  Subsequently did see her o ophthalmologist.  Does have a history of seasonal allergies and symptoms started after she was outside in the garden pulling weeds and cutting flowers.   Past Medical History:  Diagnosis Date  . Arthritis   . Asthma   . Barrett esophagus   . Coronary artery disease   . Diverticulosis   . Environmental allergies   . Fibrocystic breast disease   . GERD (gastroesophageal reflux disease)   . Hyperlipemia   . Hyperlipidemia   . Hypertension   . PSVT (paroxysmal supraventricular tachycardia) ([place])   . Reactive airway disease   . Recurrent sinusitis   . Reflux esophagitis   . Torticollis   . Torticollis    Family History  Problem Relation Age of Onset  . Heart disease Mother   . Heart disease Sister   . Cancer Neg Hx   . Diabetes Neg Hx   . Breast cancer Neg Hx   . Amblyopia Neg Hx   . Blindness Neg Hx   . Cataracts Neg Hx   . Glaucoma Neg Hx   . Macular degeneration Neg Hx   . Retinal detachment Neg Hx   . Strabismus Neg Hx   . Retinitis pigmentosa Neg Hx    Past Surgical History:  Procedure Laterality Date  . ABDOMINAL HYSTERECTOMY    . BREAST BIOPSY Left    neg  . CATARACT EXTRACTION Right 03/13/2018   Dr. Herbert Deaner  . COLONOSCOPY WITH PROPOFOL N/A 06/23/2015   Procedure: COLONOSCOPY WITH  PROPOFOL;  Surgeon: [name], MD;  Location: [facility] ENDOSCOPY;  Service: Endoscopy;  Laterality: N/A;  . ESOPHAGOGASTRODUODENOSCOPY (EGD) WITH PROPOFOL N/A 06/23/2015   Procedure: ESOPHAGOGASTRODUODENOSCOPY (EGD) WITH PROPOFOL;  Surgeon: [name], MD;  Location[facility] ENDOSCOPY;  Service: Endoscopy;  Laterality: N/A;  . ESOPHAGOGASTRODUODENOSCOPY (EGD) WITH PROPOFOL N/A 11/28/2017   Procedure: ESOPHAGOGASTRODUODENOSCOPY (EGD) WITH PROPOFOL;  Surgeon: [name], MD;  Location: [facility] ENDOSCOPY;  Service: Endoscopy;  Laterality: N/A;  . ESOPHAGOGASTRODUODENOSCOPY (EGD) WITH PROPOFOL N/A 01/18/2018   Procedure: ESOPHAGOGASTRODUODENOSCOPY (EGD) WITH PROPOFOL;  Surgeon: [name], MD;  Location: [facility] ENDOSCOPY;  Service: Endoscopy;  Laterality: N/A;  . JOINT REPLACEMENT Left 2009  . KNEE ARTHROSCOPY    . Torticollis surgery x 2     Patient Active Problem List   Diagnosis Date Noted  . Chest pain 10/30/2017  . Cystocele 06/15/2016  . Vaginal atrophy 06/15/2016  . Unstable bladder 06/15/2016  . ARF (acute renal failure) ([place]) 05/14/2016      Prior to Admission medications   Medication Sig Start Date End Date Taking? Authorizing Provider  acetaminophen (TYLENOL) 325 MG tablet Take 325-650 mg by mouth every 6 (six) hours as needed for mild pain or moderate pain.  [provider]  aspirin 81 MG chewable tablet Chew 81 mg by mouth daily.    [provider]  atorvastatin (LIPITOR) 40 MG tablet Take 1 tablet (40 mg total) by mouth daily. 10/31/17 11/30/17  Vaughan Basta, MD  atorvastatin (LIPITOR) 40 MG tablet  01/24/18   [provider]  calcium carbonate (OS-CAL) 600 MG TABS tablet Take 600 mg by mouth daily.     [provider]  CARTIA XT 180 MG 24 hr capsule Take 180 mg by mouth 2 (two) times daily. 03/03/16   [provider]  diclofenac (VOLTAREN) 75 MG EC tablet Take 75 mg by mouth 2 (two) times daily.    [provider]  gentamicin (GARAMYCIN) 0.3 % ophthalmic solution Place 1 drop into the right eye every 4 (four) hours. 05/23/18   Sable Feil, PA-C  isosorbide mononitrate (IMDUR) 30 MG 24 hr tablet Take 1 tablet (30 mg total) by mouth daily for 7 days. 10/31/17 11/07/17  Vaughan Basta, MD  loratadine (CLARITIN) 10 MG tablet Take 10 mg by mouth daily.    [provider]  metoprolol tartrate (LOPRESSOR) 25 MG tablet Take 0.5 tablets (12.5 mg total) by mouth 2 (two) times daily. 10/31/17 10/31/18  Vaughan Basta, MD  montelukast (SINGULAIR) 10 MG tablet Take 10 mg by mouth at bedtime.    [provider]  Multiple Vitamin (MULTIVITAMIN) tablet Take 1 tablet by mouth daily.    [provider]  pantoprazole (PROTONIX) 40 MG tablet Take 40 mg by mouth daily. 09/25/17   [provider]  timolol (TIMOPTIC) 0.25 % ophthalmic solution Place 1 drop into both eyes daily.    [provider]  tiZANidine (ZANAFLEX) 4 MG capsule Take 4 mg by mouth 3 (three) times daily as needed for muscle spasms.    [provider]  tiZANidine (ZANAFLEX) 4 MG tablet  12/11/17   [provider]    Allergies Penicillins and Sulfa antibiotics    Social History Social History   Tobacco Use  . Smoking status: Never Smoker  . Smokeless tobacco: Never Used  Substance Use Topics  . Alcohol use: No  . Drug use: No    Review of Systems Patient denies headaches, rhinorrhea, blurry vision, numbness, shortness of breath, chest pain, edema, cough, abdominal pain, nausea, vomiting, diarrhea, dysuria, fevers, rashes or hallucinations unless otherwise stated above in HPI. ____________________________________________   PHYSICAL EXAM:  VITAL SIGNS: Vitals:   05/30/18 0630  BP: (!) 167/95  Pulse: 62  Temp: 97.7 F (36.5 C)  SpO2: 98%    Constitutional: Alert and oriented.  Eyes: Conjunctivae with some ild redness on right>L.  There is infraorbital  puffiness without erythema, crepitus or bruising. Head: Atraumatic. Nose: No congestion/rhinnorhea. Mouth/Throat: Mucous membranes are moist.   Neck: No stridor. Painless ROM.  Cardiovascular: Normal rate, regular rhythm. Grossly normal heart sounds.  Good peripheral circulation. Respiratory: Normal respiratory effort.  No retractions. Lungs CTAB. Gastrointestinal: Soft and nontender. No distention. No abdominal bruits. No CVA tenderness. Genitourinary:  Musculoskeletal: No lower extremity tenderness nor edema.  No joint effusions. Neurologic:  Normal speech and language. No gross focal neurologic deficits are appreciated. No facial droop Skin:  Skin is warm, dry and intact. No rash noted. Psychiatric: Mood and affect are normal. Speech and behavior are normal.  ____________________________________________   LABS (all labs ordered are listed, but only abnormal results are displayed)  No results found for this or any previous visit (from the past 24  hour(s)). ____________________________________________ ____________________________________________  YQMVHQION  I personally reviewed all radiographic images ordered to evaluate for the above acute complaints and reviewed radiology reports and findings.  These findings were personally discussed with the patient.  Please see medical record for radiology report.  ____________________________________________   PROCEDURES  Procedure(s) performed:  Procedures    Critical Care performed: no ____________________________________________   INITIAL IMPRESSION / ASSESSMENT AND PLAN / ED COURSE  Pertinent labs & imaging results that were available during my care of the patient were reviewed by me and considered in my medical decision making (see chart for details).   DDX: Allergic conjunctivitis, edema, mass,  Mary Thornton is a 77 y.o. who presents to the ED with symptoms as described above.  Patient in no acute distress.  Neuro  exam is nonfocal.  No evidence of proptosis or orbital cellulitis.  No evidence of periorbital cellulitis.  Seems more clinically consistent with allergy.  Patient is very concerned about symptoms and after discussion of therapeutic versus diagnostics patient requesting CT imaging for which CT imaging was ordered.  No evidence of mass-effect or bleed.  At this point patient does appear stable and appropriate for outpatient follow-up.  Have discussed with the patient and available family all diagnostics and treatments performed thus far and all questions were answered to the best of my ability. The patient demonstrates understanding and agreement with plan.       As part of my medical decision making, I reviewed the following data within the [place] notes reviewed and incorporated, Labs reviewed, notes from prior ED visits.  ____________________________________________   FINAL CLINICAL IMPRESSION(S) / ED DIAGNOSES  Final diagnoses:  Periorbital swelling      NEW MEDICATIONS STARTED DURING THIS VISIT:  New Prescriptions   No medications on file     Note:  This document was prepared using Dragon voice recognition software and may include unintentional dictation errors.    Merlyn Lot, MD 05/30/18 [phone]

## 2018-05-30 NOTE — ED Triage Notes (Signed)
Here today for headache and " feeling of bleeding under her skin on her face" no visible signs of bleeding or bruising noted.  was seen for same last week.  DX with eye infection.  Denies visual or blurred vision.

## 2018-05-30 NOTE — ED Notes (Signed)
Pt states she feels like bleeding on inside of face running down from eyes. On observation no blood noted in or around the eyes, nasal cavity, or in back of throat. Pt denies any numbness or tingling, or abnormal sensations.

## 2018-06-06 ENCOUNTER — Encounter: Payer: Self-pay | Admitting: Emergency Medicine

## 2018-06-06 ENCOUNTER — Other Ambulatory Visit: Payer: Self-pay

## 2018-06-06 ENCOUNTER — Emergency Department
Admission: EM | Admit: 2018-06-06 | Discharge: 2018-06-06 | Disposition: A | Discharge status: Home / Self Care | Payer: Medicare HMO | Attending: Emergency Medicine | Admitting: Emergency Medicine

## 2018-06-06 DIAGNOSIS — Z7982 Long term (current) use of aspirin: Secondary | ICD-10-CM | POA: Insufficient documentation

## 2018-06-06 DIAGNOSIS — J45909 Unspecified asthma, uncomplicated: Secondary | ICD-10-CM | POA: Diagnosis not present

## 2018-06-06 DIAGNOSIS — Z79899 Other long term (current) drug therapy: Secondary | ICD-10-CM | POA: Diagnosis not present

## 2018-06-06 DIAGNOSIS — H353132 Nonexudative age-related macular degeneration, bilateral, intermediate dry stage: Secondary | ICD-10-CM | POA: Diagnosis not present

## 2018-06-06 DIAGNOSIS — H05221 Edema of right orbit: Secondary | ICD-10-CM | POA: Diagnosis not present

## 2018-06-06 DIAGNOSIS — R600 Localized edema: Secondary | ICD-10-CM | POA: Diagnosis not present

## 2018-06-06 DIAGNOSIS — I10 Essential (primary) hypertension: Secondary | ICD-10-CM | POA: Insufficient documentation

## 2018-06-06 DIAGNOSIS — H5711 Ocular pain, right eye: Secondary | ICD-10-CM | POA: Diagnosis present

## 2018-06-06 NOTE — ED Provider Notes (Signed)
[facility] Emergency Department Provider Note   ____________________________________________   First MD Initiated Contact with Patient 06/06/18 [phone]     (approximate)  I have reviewed the triage vital signs and the nursing notes.   HISTORY  Chief Complaint Eye Pain    HPI Mary Thornton is a 77 y.o. female patient presents with right eye "aching" which has been persistent for 3 months.  Patient has cataract surgery 3 months ago.  Since then she has had sensation of bleeding internally facial area.  This is a patient third ER visit for the same complaint.  On her last visit she had a CT facial area which was unremarkable.  She was especially told her there was no bleeding.  Patient persisted there is pain is seen to other practitioners since her ER visit.  Patient is scheduled to see how treating ophthalmologist at 130 today.  She is here today because she had "bleeding" last night caused her not to have a restful sleep.  Patient denies vision disturbance.  Patient denies headache.  Patient denies vertigo.  Patient states sensation is worse at night when trying to sleep.  Past Medical History:  Diagnosis Date  . Arthritis   . Asthma   . Barrett esophagus   . Coronary artery disease   . Diverticulosis   . Environmental allergies   . Fibrocystic breast disease   . GERD (gastroesophageal reflux disease)   . Hyperlipemia   . Hyperlipidemia   . Hypertension   . PSVT (paroxysmal supraventricular tachycardia) ([place])   . Reactive airway disease   . Recurrent sinusitis   . Reflux esophagitis   . Torticollis   . Torticollis     Patient Active Problem List   Diagnosis Date Noted  . Chest pain 10/30/2017  . Cystocele 06/15/2016  . Vaginal atrophy 06/15/2016  . Unstable bladder 06/15/2016  . ARF (acute renal failure) ([place]) 05/14/2016    Past Surgical History:  Procedure Laterality Date  . ABDOMINAL HYSTERECTOMY    . BREAST BIOPSY Left    neg  .  CATARACT EXTRACTION Right 03/13/2018   Dr. Herbert Deaner  . COLONOSCOPY WITH PROPOFOL N/A 06/23/2015   Procedure: COLONOSCOPY WITH PROPOFOL;  Surgeon: [name], MD;  Location: [facility] ENDOSCOPY;  Service: Endoscopy;  Laterality: N/A;  . ESOPHAGOGASTRODUODENOSCOPY (EGD) WITH PROPOFOL N/A 06/23/2015   Procedure: ESOPHAGOGASTRODUODENOSCOPY (EGD) WITH PROPOFOL;  Surgeon: [name], MD;  Location: [facility] ENDOSCOPY;  Service: Endoscopy;  Laterality: N/A;  . ESOPHAGOGASTRODUODENOSCOPY (EGD) WITH PROPOFOL N/A 11/28/2017   Procedure: ESOPHAGOGASTRODUODENOSCOPY (EGD) WITH PROPOFOL;  Surgeon: [name], MD;  Location: [facility] ENDOSCOPY;  Service: Endoscopy;  Laterality: N/A;  . ESOPHAGOGASTRODUODENOSCOPY (EGD) WITH PROPOFOL N/A 01/18/2018   Procedure: ESOPHAGOGASTRODUODENOSCOPY (EGD) WITH PROPOFOL;  Surgeon: [name], MD;  Location: [facility] ENDOSCOPY;  Service: Endoscopy;  Laterality: N/A;  . JOINT REPLACEMENT Left 2009  . KNEE ARTHROSCOPY    . Torticollis surgery x 2      Prior to Admission medications   Medication Sig Start Date End Date Taking? Authorizing Provider  acetaminophen (TYLENOL) 325 MG tablet Take 325-650 mg by mouth every 6 (six) hours as needed for mild pain or moderate pain.     [provider]  aspirin 81 MG chewable tablet Chew 81 mg by mouth daily.    [provider]  atorvastatin (LIPITOR) 40 MG tablet Take 1 tablet (40 mg total) by mouth daily. 10/31/17 11/30/17  Vaughan Basta, MD  atorvastatin (LIPITOR) 40 MG  tablet  01/24/18   [provider]  calcium carbonate (OS-CAL) 600 MG TABS tablet Take 600 mg by mouth daily.     [provider]  CARTIA XT 180 MG 24 hr capsule Take 180 mg by mouth 2 (two) times daily. 03/03/16   [provider]  diclofenac (VOLTAREN) 75 MG EC tablet Take 75 mg by mouth 2 (two) times daily.    [provider]  gentamicin (GARAMYCIN) 0.3 % ophthalmic solution Place 1 drop into the right  eye every 4 (four) hours. 05/23/18   Sable Feil, PA-C  isosorbide mononitrate (IMDUR) 30 MG 24 hr tablet Take 1 tablet (30 mg total) by mouth daily for 7 days. 10/31/17 11/07/17  Vaughan Basta, MD  loratadine (CLARITIN) 10 MG tablet Take 10 mg by mouth daily.    [provider]  loratadine (CLARITIN) 10 MG tablet Take 1 tablet (10 mg total) by mouth daily. 05/30/18 05/30/19  Merlyn Lot, MD  metoprolol tartrate (LOPRESSOR) 25 MG tablet Take 0.5 tablets (12.5 mg total) by mouth 2 (two) times daily. 10/31/17 10/31/18  Vaughan Basta, MD  montelukast (SINGULAIR) 10 MG tablet Take 10 mg by mouth at bedtime.    [provider]  Multiple Vitamin (MULTIVITAMIN) tablet Take 1 tablet by mouth daily.    [provider]  pantoprazole (PROTONIX) 40 MG tablet Take 40 mg by mouth daily. 09/25/17   [provider]  timolol (TIMOPTIC) 0.25 % ophthalmic solution Place 1 drop into both eyes daily.    [provider]  tiZANidine (ZANAFLEX) 4 MG capsule Take 4 mg by mouth 3 (three) times daily as needed for muscle spasms.    [provider]  tiZANidine (ZANAFLEX) 4 MG tablet  12/11/17   [provider]    Allergies Penicillins and Sulfa antibiotics  Family History  Problem Relation Age of Onset  . Heart disease Mother   . Heart disease Sister   . Cancer Neg Hx   . Diabetes Neg Hx   . Breast cancer Neg Hx   . Amblyopia Neg Hx   . Blindness Neg Hx   . Cataracts Neg Hx   . Glaucoma Neg Hx   . Macular degeneration Neg Hx   . Retinal detachment Neg Hx   . Strabismus Neg Hx   . Retinitis pigmentosa Neg Hx     Social History Social History   Tobacco Use  . Smoking status: Never Smoker  . Smokeless tobacco: Never Used  Substance Use Topics  . Alcohol use: No  . Drug use: No    Review of Systems Constitutional: No fever/chills Eyes: No visual changes. ENT: No sore throat. Cardiovascular: Denies chest pain. Respiratory:  Denies shortness of breath. Gastrointestinal: No abdominal pain.  No nausea, no vomiting.  No diarrhea.  No constipation. Genitourinary: Negative for dysuria. Musculoskeletal: Negative for back pain. Skin: Negative for rash.  Periorbital edema without erythema. Neurological: Negative for headaches, focal weakness or numbness. Endocrine:Hyperlipidemia and hypertension. Allergic/Immunilogical: Penicillin and sulfa antibiotics. ____________________________________________   PHYSICAL EXAM:  VITAL SIGNS: ED Triage Vitals  Enc Vitals Group     BP 06/06/18 0600 (!) 156/78     Pulse Rate 06/06/18 0600 61     Resp 06/06/18 0600 18     Temp 06/06/18 0600 97.6 F (36.4 C)     Temp Source 06/06/18 0600 Oral     SpO2 06/06/18 0600 96 %     Weight 06/06/18 0601 110 lb (49.9 kg)  Height 06/06/18 0601 5\' 3"  (1.6 m)     Head Circumference --      Peak Flow --      Pain Score 06/06/18 0600 4     Pain Loc --      Pain Edu? --      Excl. in [place]? --    Constitutional: Alert and oriented. Well appearing and in no acute distress.  Appears anxious Eyes: Conjunctivae are normal. PERRL. EOMI. patient has infraorbital puffiness without edema, ecchymosis or erythema. Head: Atraumatic. Nose: No congestion/rhinnorhea. Mouth/Throat: Mucous membranes are moist.  Oropharynx non-erythematous. Neck: No stridor.  Hematological/Lymphatic/Immunilogical: No cervical lymphadenopathy. Cardiovascular: Normal rate, regular rhythm. Grossly normal heart sounds.  Good peripheral circulation. Respiratory: Normal respiratory effort.  No retractions. Lungs CTAB. Neurologic:  Normal speech and language. No gross focal neurologic deficits are appreciated. No gait instability. Skin:  Skin is warm, dry and intact. No rash noted. Psychiatric: Mood and affect are normal. Speech and behavior are normal.  ____________________________________________   LABS (all labs ordered are listed, but only abnormal results are  displayed)  Labs Reviewed - No data to display ____________________________________________  EKG   ____________________________________________  RADIOLOGY  ED MD interpretation:    Official radiology report(s): No results found.  ____________________________________________   PROCEDURES  Procedure(s) performed: None  Procedures  Critical Care performed: No  ____________________________________________   INITIAL IMPRESSION / ASSESSMENT AND PLAN / ED COURSE  As part of my medical decision making, I reviewed the following data within the [place]    Patient presents with sensation of "bleeding under her eyes.  Physical exam shows no evidence of infection.  History reveals the patient's sensation seems to increase with trying to sleep.  Discussed with patient rationale for follow-up with schedule ophthalmology today.  Also advised patient to discuss this sensation from family doctor as it might be an anxiety component.     ____________________________________________   FINAL CLINICAL IMPRESSION(S) / ED DIAGNOSES  Final diagnoses:  Periorbital edema     ED Discharge Orders    None       Note:  This document was prepared using Dragon voice recognition software and may include unintentional dictation errors.    Sable Feil, PA-C 06/06/18 9450    Darel Hong, MD 06/06/18 [phone]

## 2018-06-06 NOTE — ED Notes (Signed)
See triage note  Presents with eye pain    States she has been seen 3 times for same  Was also seen by PCP on Friday and has an appt this afternoon at [place] she still feels like something is bleeding down her face denies any blurred vision  No trauma no n/v  States feeling is worse when she lays flat

## 2018-06-06 NOTE — ED Triage Notes (Addendum)
Pt presents to ED with right eye "aching", which has been persistent since her cataract surgery in may, and feeling like she is bleeding down her face from her forehead down to her chin. Hx of the same symptoms about a week ago and pt was told she had periorbital swelling after CT scan. Pt was told to call her pcp for follow up and pt states. Pt denies vision changes.

## 2018-06-06 NOTE — Discharge Instructions (Addendum)
Advised follow-up with schedule ophthalmologist appointment this afternoon.

## 2018-06-19 DIAGNOSIS — R634 Abnormal weight loss: Secondary | ICD-10-CM | POA: Diagnosis not present

## 2018-06-19 DIAGNOSIS — G519 Disorder of facial nerve, unspecified: Secondary | ICD-10-CM | POA: Diagnosis not present

## 2018-06-27 DIAGNOSIS — M47812 Spondylosis without myelopathy or radiculopathy, cervical region: Secondary | ICD-10-CM | POA: Diagnosis not present

## 2018-06-27 DIAGNOSIS — R200 Anesthesia of skin: Secondary | ICD-10-CM | POA: Diagnosis not present

## 2018-06-28 ENCOUNTER — Other Ambulatory Visit: Payer: Self-pay | Admitting: Internal Medicine

## 2018-06-28 DIAGNOSIS — R200 Anesthesia of skin: Secondary | ICD-10-CM

## 2018-07-03 DIAGNOSIS — H353131 Nonexudative age-related macular degeneration, bilateral, early dry stage: Secondary | ICD-10-CM | POA: Diagnosis not present

## 2018-07-03 DIAGNOSIS — H401131 Primary open-angle glaucoma, bilateral, mild stage: Secondary | ICD-10-CM | POA: Diagnosis not present

## 2018-07-03 DIAGNOSIS — H40019 Open angle with borderline findings, low risk, unspecified eye: Secondary | ICD-10-CM | POA: Diagnosis not present

## 2018-07-09 DIAGNOSIS — M5441 Lumbago with sciatica, right side: Secondary | ICD-10-CM | POA: Diagnosis not present

## 2018-07-09 DIAGNOSIS — G8929 Other chronic pain: Secondary | ICD-10-CM | POA: Diagnosis not present

## 2018-07-13 ENCOUNTER — Ambulatory Visit
Admission: RE | Admit: 2018-07-13 | Discharge: 2018-07-13 | Disposition: A | Discharge status: Home / Self Care | Payer: Medicare HMO | Source: Ambulatory Visit | Attending: Internal Medicine | Admitting: Internal Medicine

## 2018-07-13 DIAGNOSIS — M47812 Spondylosis without myelopathy or radiculopathy, cervical region: Secondary | ICD-10-CM | POA: Insufficient documentation

## 2018-07-13 DIAGNOSIS — R200 Anesthesia of skin: Secondary | ICD-10-CM | POA: Insufficient documentation

## 2018-07-16 ENCOUNTER — Other Ambulatory Visit: Payer: Self-pay | Admitting: Internal Medicine

## 2018-07-16 DIAGNOSIS — I10 Essential (primary) hypertension: Secondary | ICD-10-CM | POA: Diagnosis not present

## 2018-07-16 DIAGNOSIS — M5441 Lumbago with sciatica, right side: Secondary | ICD-10-CM | POA: Diagnosis not present

## 2018-07-16 DIAGNOSIS — Z1239 Encounter for other screening for malignant neoplasm of breast: Secondary | ICD-10-CM | POA: Diagnosis not present

## 2018-07-16 DIAGNOSIS — R200 Anesthesia of skin: Secondary | ICD-10-CM | POA: Diagnosis not present

## 2018-07-16 DIAGNOSIS — M5136 Other intervertebral disc degeneration, lumbar region: Secondary | ICD-10-CM | POA: Diagnosis not present

## 2018-07-17 ENCOUNTER — Other Ambulatory Visit: Payer: Self-pay | Admitting: Internal Medicine

## 2018-07-17 DIAGNOSIS — Z1231 Encounter for screening mammogram for malignant neoplasm of breast: Secondary | ICD-10-CM

## 2018-07-17 DIAGNOSIS — M5416 Radiculopathy, lumbar region: Secondary | ICD-10-CM | POA: Diagnosis not present

## 2018-07-17 DIAGNOSIS — M4726 Other spondylosis with radiculopathy, lumbar region: Secondary | ICD-10-CM | POA: Diagnosis not present

## 2018-07-17 DIAGNOSIS — M5136 Other intervertebral disc degeneration, lumbar region: Secondary | ICD-10-CM | POA: Diagnosis not present

## 2018-07-28 ENCOUNTER — Ambulatory Visit: Admission: RE | Admit: 2018-07-28 | Payer: Medicare HMO | Source: Ambulatory Visit

## 2018-07-29 ENCOUNTER — Ambulatory Visit
Admission: RE | Admit: 2018-07-29 | Discharge: 2018-07-29 | Disposition: A | Discharge status: Home / Self Care | Payer: Medicare HMO | Source: Ambulatory Visit | Attending: Internal Medicine | Admitting: Internal Medicine

## 2018-07-29 DIAGNOSIS — M5126 Other intervertebral disc displacement, lumbar region: Secondary | ICD-10-CM | POA: Diagnosis not present

## 2018-07-29 DIAGNOSIS — M5136 Other intervertebral disc degeneration, lumbar region: Secondary | ICD-10-CM | POA: Diagnosis not present

## 2018-07-29 DIAGNOSIS — M48061 Spinal stenosis, lumbar region without neurogenic claudication: Secondary | ICD-10-CM | POA: Diagnosis not present

## 2018-07-29 DIAGNOSIS — M5441 Lumbago with sciatica, right side: Secondary | ICD-10-CM | POA: Insufficient documentation

## 2018-07-29 DIAGNOSIS — M545 Low back pain: Secondary | ICD-10-CM | POA: Diagnosis not present

## 2018-07-31 DIAGNOSIS — M5136 Other intervertebral disc degeneration, lumbar region: Secondary | ICD-10-CM | POA: Diagnosis not present

## 2018-07-31 DIAGNOSIS — G5790 Unspecified mononeuropathy of unspecified lower limb: Secondary | ICD-10-CM

## 2018-07-31 DIAGNOSIS — M5416 Radiculopathy, lumbar region: Secondary | ICD-10-CM | POA: Diagnosis not present

## 2018-07-31 DIAGNOSIS — M48062 Spinal stenosis, lumbar region with neurogenic claudication: Secondary | ICD-10-CM | POA: Diagnosis not present

## 2018-07-31 HISTORY — DX: Unspecified mononeuropathy of unspecified lower limb: G57.90

## 2018-08-09 DIAGNOSIS — M48062 Spinal stenosis, lumbar region with neurogenic claudication: Secondary | ICD-10-CM | POA: Diagnosis not present

## 2018-08-14 ENCOUNTER — Encounter
Admission: RE | Admit: 2018-08-14 | Discharge: 2018-08-14 | Disposition: A | Discharge status: Home / Self Care | Payer: Medicare HMO | Source: Ambulatory Visit | Attending: Neurosurgery | Admitting: Neurosurgery

## 2018-08-14 ENCOUNTER — Other Ambulatory Visit: Payer: Self-pay

## 2018-08-14 DIAGNOSIS — R9431 Abnormal electrocardiogram [ECG] [EKG]: Secondary | ICD-10-CM | POA: Diagnosis not present

## 2018-08-14 DIAGNOSIS — M5136 Other intervertebral disc degeneration, lumbar region: Secondary | ICD-10-CM | POA: Insufficient documentation

## 2018-08-14 DIAGNOSIS — Z23 Encounter for immunization: Secondary | ICD-10-CM | POA: Diagnosis not present

## 2018-08-14 DIAGNOSIS — Z01818 Encounter for other preprocedural examination: Secondary | ICD-10-CM | POA: Diagnosis not present

## 2018-08-14 DIAGNOSIS — I10 Essential (primary) hypertension: Secondary | ICD-10-CM | POA: Insufficient documentation

## 2018-08-14 DIAGNOSIS — I2510 Atherosclerotic heart disease of native coronary artery without angina pectoris: Secondary | ICD-10-CM | POA: Diagnosis not present

## 2018-08-14 DIAGNOSIS — E7800 Pure hypercholesterolemia, unspecified: Secondary | ICD-10-CM | POA: Diagnosis not present

## 2018-08-14 DIAGNOSIS — R001 Bradycardia, unspecified: Secondary | ICD-10-CM | POA: Diagnosis not present

## 2018-08-14 HISTORY — DX: Unspecified mononeuropathy of unspecified lower limb: G57.90

## 2018-08-14 HISTORY — DX: Other complications of anesthesia, initial encounter: T88.59XA

## 2018-08-14 HISTORY — DX: Other specified postprocedural states: R11.2

## 2018-08-14 HISTORY — DX: Unspecified macular degeneration: H35.30

## 2018-08-14 HISTORY — DX: Cardiac arrhythmia, unspecified: I49.9

## 2018-08-14 HISTORY — DX: Unspecified glaucoma: H40.9

## 2018-08-14 HISTORY — DX: Other specified postprocedural states: Z98.890

## 2018-08-14 HISTORY — DX: Other seasonal allergic rhinitis: J30.2

## 2018-08-14 HISTORY — DX: Adverse effect of unspecified anesthetic, initial encounter: T41.45XA

## 2018-08-14 LAB — URINALYSIS, ROUTINE W REFLEX MICROSCOPIC
Bilirubin Urine: NEGATIVE
Glucose, UA: NEGATIVE mg/dL
HGB URINE DIPSTICK: NEGATIVE
Ketones, ur: NEGATIVE mg/dL
Leukocytes, UA: NEGATIVE
Nitrite: NEGATIVE
PH: 6 (ref 5.0–8.0)
Protein, ur: NEGATIVE mg/dL
SPECIFIC GRAVITY, URINE: 1.006 (ref 1.005–1.030)

## 2018-08-14 LAB — CBC
HEMATOCRIT: 40.3 % (ref 36.0–46.0)
HEMOGLOBIN: 13.2 g/dL (ref 12.0–15.0)
MCH: 31.1 pg (ref 26.0–34.0)
MCHC: 32.8 g/dL (ref 30.0–36.0)
MCV: 95 fL (ref 80.0–100.0)
Platelets: 335 10*3/uL (ref 150–400)
RBC: 4.24 MIL/uL (ref 3.87–5.11)
RDW: 13.2 % (ref 11.5–15.5)
WBC: 6.6 10*3/uL (ref 4.0–10.5)
nRBC: 0 % (ref 0.0–0.2)

## 2018-08-14 LAB — APTT: aPTT: 37 seconds — ABNORMAL HIGH (ref 24–36)

## 2018-08-14 LAB — SURGICAL PCR SCREEN
MRSA, PCR: NEGATIVE
STAPHYLOCOCCUS AUREUS: NEGATIVE

## 2018-08-14 LAB — BASIC METABOLIC PANEL
Anion gap: 6 (ref 5–15)
BUN: 19 mg/dL (ref 8–23)
CO2: 30 mmol/L (ref 22–32)
Calcium: 8.8 mg/dL — ABNORMAL LOW (ref 8.9–10.3)
Chloride: 104 mmol/L (ref 98–111)
Creatinine, Ser: 0.63 mg/dL (ref 0.44–1.00)
GFR calc Af Amer: >60 mL/min (ref >60)
GFR calc non Af Amer: >60 mL/min (ref >60)
GLUCOSE: 96 mg/dL (ref 70–99)
Potassium: 4 mmol/L (ref 3.5–5.1)
Sodium: 140 mmol/L (ref 135–145)

## 2018-08-14 LAB — PROTIME-INR
INR: 0.94
Prothrombin Time: 12.5 seconds (ref 11.4–15.2)

## 2018-08-14 NOTE — Patient Instructions (Signed)
Your procedure is scheduled [id number], October 23rd  Report to [facility].    DO NOT STOP ON THE FIRST FLOOR TO REGISTER  To find out your arrival time please call [phone] between 1PM - 3PM on Tuesday, October 22nd  Remember: Instructions that are not followed completely may result in serious medical risk,  up to and including death, or upon the discretion of your surgeon and anesthesiologist your  surgery may need to be rescheduled.     _X__ 1. Do not eat food after midnight the night before your procedure.                 No gum chewing or hard candies.                     ABSOLUTELY NOTHING SOLID IN YOUR MOUTH AFTER MIDNIGHT.                      INCLUDES TIC TACS, LOZENGERS, NOTHING SOLID                  You may drink clear liquids up to 2 hours before you are scheduled to arrive for your surgery-                  DO not drink clear liquids within 2 hours of the start of your surgery.                  Clear Liquids include:  water, apple juice without pulp, clear carbohydrate                 drink such as Clearfast of Gatorade, Black Coffee or Tea (Do not add                 anything to coffee or tea).  __X__2.  On the morning of surgery brush your teeth with toothpaste and water,                  You may rinse your mouth with mouthwash if you wish.                        Do not swallow any toothpaste of mouthwash.     _X__ 3.  No Alcohol for 24 hours before or after surgery.   _X__ 4.  Do Not Smoke or use e-cigarettes For 24 Hours Prior to Your Surgery.                 Do not use any chewable tobacco products for at least 6 hours prior to                 surgery.  ____  5.  Bring all medications with you on the day of surgery if instructed.   _X___  6.  Notify your doctor if there is any change in your medical condition      (cold, fever, infections).     Do not wear jewelry, make-up, hairpins, clips or nail  polish. Do not wear lotions, powders, or perfumes. You may wear deodorant. Do not shave 48 hours prior to surgery. Men may shave face and neck. Do not bring valuables to the hospital.    [facility] is not responsible for any belongings or valuables.  Contacts, dentures or bridgework may not be worn into surgery. Leave your suitcase in the car. After surgery it may  be brought to your room. For patients admitted to the hospital, discharge time is determined by your treatment team.   Patients discharged the day of surgery will not be allowed to drive home.   Please read over the following fact sheets that you were given:   PREPARING FOR SURGERY   __X__ Take these medicines the morning of surgery with A SIP OF WATER:    1. CARTIA/DILTIAZEM  2. METOPROLOL  3. LIPITOR  4. SINGULAIR  5. PROTONIX  6. NORCO/PAIN MEDICINE             7.  TIMOPTIC EYE DROPS             8. ZANAFLEX, IF NEEDED             9. CLARITIN, IF NEEDED  ____ Fleet Enema (as directed)   _X___ Use CHG Soap as directed  ____ Use inhalers on the day of surgery  __X__ Stop ASPIRIN AND ALL ASPIRIN PRODUCTS AS OF TODAY  __X__ Stop Anti-inflammatories AS OF TODAY   ____ Stop supplements until after surgery.    ____ Bring C-Pap to the hospital.   YOU MAY CONTINUE TAKING THE MULTIVITAMINS, OSCAL AND PRESERVISION AREDS BUT DO NOT TAKE ON THE DAY OF SURGERY  HAVE A STOOL SOFTENER AT HOME READY TO [place].     GOOD IDEA TO START TAKING A DAY OR TWO BEFORE SURGERY  WEAR SOMETHING LOOSE AND COMFORTABLE TO [place]

## 2018-08-15 DIAGNOSIS — R0602 Shortness of breath: Secondary | ICD-10-CM | POA: Diagnosis not present

## 2018-08-15 DIAGNOSIS — M436 Torticollis: Secondary | ICD-10-CM | POA: Diagnosis not present

## 2018-08-15 DIAGNOSIS — I208 Other forms of angina pectoris: Secondary | ICD-10-CM | POA: Diagnosis not present

## 2018-08-15 DIAGNOSIS — R002 Palpitations: Secondary | ICD-10-CM | POA: Diagnosis not present

## 2018-08-15 DIAGNOSIS — K219 Gastro-esophageal reflux disease without esophagitis: Secondary | ICD-10-CM | POA: Diagnosis not present

## 2018-08-15 DIAGNOSIS — E782 Mixed hyperlipidemia: Secondary | ICD-10-CM | POA: Diagnosis not present

## 2018-08-15 DIAGNOSIS — Z01818 Encounter for other preprocedural examination: Secondary | ICD-10-CM | POA: Diagnosis not present

## 2018-08-15 DIAGNOSIS — I10 Essential (primary) hypertension: Secondary | ICD-10-CM | POA: Diagnosis not present

## 2018-08-15 DIAGNOSIS — J45909 Unspecified asthma, uncomplicated: Secondary | ICD-10-CM | POA: Diagnosis not present

## 2018-08-15 NOTE — Pre-Procedure Instructions (Signed)
EKG FAXED TO DR CALLWOOD TO REVIEW. SEEN 1/19 AND STRESS /ECHO DONE. Mary Thornton

## 2018-08-22 ENCOUNTER — Encounter
Admission: RE | Disposition: A | Discharge status: Home / Self Care | Payer: Self-pay | Source: Ambulatory Visit | Attending: Neurosurgery

## 2018-08-22 ENCOUNTER — Observation Stay
Admission: RE | Admit: 2018-08-22 | Discharge: 2018-08-23 | Disposition: A | Discharge status: Home / Self Care | Payer: Medicare HMO | Source: Ambulatory Visit | Attending: Neurosurgery | Admitting: Neurosurgery

## 2018-08-22 ENCOUNTER — Ambulatory Visit: Payer: Medicare HMO

## 2018-08-22 ENCOUNTER — Encounter: Payer: Self-pay | Admitting: *Deleted

## 2018-08-22 ENCOUNTER — Ambulatory Visit: Payer: Medicare HMO | Admitting: Certified Registered Nurse Anesthetist

## 2018-08-22 ENCOUNTER — Other Ambulatory Visit: Payer: Self-pay

## 2018-08-22 DIAGNOSIS — Z419 Encounter for procedure for purposes other than remedying health state, unspecified: Secondary | ICD-10-CM

## 2018-08-22 DIAGNOSIS — M5416 Radiculopathy, lumbar region: Secondary | ICD-10-CM | POA: Diagnosis present

## 2018-08-22 DIAGNOSIS — M1990 Unspecified osteoarthritis, unspecified site: Secondary | ICD-10-CM | POA: Diagnosis not present

## 2018-08-22 DIAGNOSIS — Z888 Allergy status to other drugs, medicaments and biological substances status: Secondary | ICD-10-CM | POA: Insufficient documentation

## 2018-08-22 DIAGNOSIS — K219 Gastro-esophageal reflux disease without esophagitis: Secondary | ICD-10-CM | POA: Insufficient documentation

## 2018-08-22 DIAGNOSIS — Z791 Long term (current) use of non-steroidal anti-inflammatories (NSAID): Secondary | ICD-10-CM | POA: Insufficient documentation

## 2018-08-22 DIAGNOSIS — Z880 Allergy status to penicillin: Secondary | ICD-10-CM | POA: Insufficient documentation

## 2018-08-22 DIAGNOSIS — E785 Hyperlipidemia, unspecified: Secondary | ICD-10-CM | POA: Diagnosis not present

## 2018-08-22 DIAGNOSIS — G9519 Other vascular myelopathies: Secondary | ICD-10-CM | POA: Diagnosis not present

## 2018-08-22 DIAGNOSIS — Z8249 Family history of ischemic heart disease and other diseases of the circulatory system: Secondary | ICD-10-CM | POA: Insufficient documentation

## 2018-08-22 DIAGNOSIS — I10 Essential (primary) hypertension: Secondary | ICD-10-CM | POA: Insufficient documentation

## 2018-08-22 DIAGNOSIS — M48062 Spinal stenosis, lumbar region with neurogenic claudication: Principal | ICD-10-CM | POA: Insufficient documentation

## 2018-08-22 DIAGNOSIS — Z882 Allergy status to sulfonamides status: Secondary | ICD-10-CM | POA: Insufficient documentation

## 2018-08-22 DIAGNOSIS — Z981 Arthrodesis status: Secondary | ICD-10-CM | POA: Diagnosis not present

## 2018-08-22 DIAGNOSIS — J45909 Unspecified asthma, uncomplicated: Secondary | ICD-10-CM | POA: Diagnosis not present

## 2018-08-22 DIAGNOSIS — I2510 Atherosclerotic heart disease of native coronary artery without angina pectoris: Secondary | ICD-10-CM | POA: Diagnosis not present

## 2018-08-22 DIAGNOSIS — Z7982 Long term (current) use of aspirin: Secondary | ICD-10-CM | POA: Diagnosis not present

## 2018-08-22 DIAGNOSIS — M25551 Pain in right hip: Secondary | ICD-10-CM | POA: Insufficient documentation

## 2018-08-22 DIAGNOSIS — Z79899 Other long term (current) drug therapy: Secondary | ICD-10-CM | POA: Insufficient documentation

## 2018-08-22 HISTORY — PX: LUMBAR LAMINECTOMY/DECOMPRESSION MICRODISCECTOMY: SHX5026

## 2018-08-22 SURGERY — LUMBAR LAMINECTOMY/DECOMPRESSION MICRODISCECTOMY 1 LEVEL
Anesthesia: General | Site: Back

## 2018-08-22 MED ORDER — ONDANSETRON HCL 4 MG PO TABS: 4.0000 mg | ORAL_TABLET | Freq: Four times a day (QID) | ORAL | Status: DC | PRN

## 2018-08-22 MED ORDER — EPHEDRINE SULFATE 50 MG/ML IJ SOLN
INTRAMUSCULAR | Status: DC | PRN
  Administered 2018-08-22 (×3): 10 mg via INTRAVENOUS
  Administered 2018-08-22: 5 mg via INTRAVENOUS
  Administered 2018-08-22: 10 mg via INTRAVENOUS

## 2018-08-22 MED ORDER — FENTANYL CITRATE (PF) 100 MCG/2ML IJ SOLN: 25.0000 ug | INTRAMUSCULAR | Status: DC | PRN

## 2018-08-22 MED ORDER — MIDAZOLAM HCL 2 MG/2ML IJ SOLN
INTRAMUSCULAR | Status: DC | PRN
  Administered 2018-08-22: 1 mg via INTRAVENOUS

## 2018-08-22 MED ORDER — EVICEL 2 ML EX KIT
PACK | CUTANEOUS | Status: AC
  Filled 2018-08-22: qty 1

## 2018-08-22 MED ORDER — LIDOCAINE HCL (CARDIAC) PF 100 MG/5ML IV SOSY
PREFILLED_SYRINGE | INTRAVENOUS | Status: DC | PRN
  Administered 2018-08-22: 50 mg via INTRAVENOUS

## 2018-08-22 MED ORDER — PROPOFOL 10 MG/ML IV BOLUS
INTRAVENOUS | Status: DC | PRN
  Administered 2018-08-22: 30 mg via INTRAVENOUS
  Administered 2018-08-22: 120 mg via INTRAVENOUS

## 2018-08-22 MED ORDER — SODIUM CHLORIDE FLUSH 0.9 % IV SOLN
INTRAVENOUS | Status: AC
  Filled 2018-08-22: qty 10

## 2018-08-22 MED ORDER — LIDOCAINE HCL (PF) 2 % IJ SOLN
INTRAMUSCULAR | Status: AC
  Filled 2018-08-22: qty 10

## 2018-08-22 MED ORDER — BUPIVACAINE-EPINEPHRINE (PF) 0.5% -1:200000 IJ SOLN
INTRAMUSCULAR | Status: AC
  Filled 2018-08-22: qty 30

## 2018-08-22 MED ORDER — PROPOFOL 10 MG/ML IV BOLUS
INTRAVENOUS | Status: AC
  Filled 2018-08-22: qty 20

## 2018-08-22 MED ORDER — REMIFENTANIL HCL 1 MG IV SOLR
INTRAVENOUS | Status: AC
  Filled 2018-08-22: qty 1000

## 2018-08-22 MED ORDER — METOPROLOL TARTRATE 25 MG PO TABS
25.0000 mg | ORAL_TABLET | Freq: Two times a day (BID) | ORAL | Status: DC
  Filled 2018-08-22: qty 1

## 2018-08-22 MED ORDER — REMIFENTANIL HCL 1 MG IV SOLR
INTRAVENOUS | Status: DC | PRN
  Administered 2018-08-22: .2 ug/kg/min via INTRAVENOUS

## 2018-08-22 MED ORDER — PROPOFOL 500 MG/50ML IV EMUL
INTRAVENOUS | Status: DC | PRN
  Administered 2018-08-22: 125 ug/kg/min via INTRAVENOUS

## 2018-08-22 MED ORDER — BUPIVACAINE HCL 0.5 % IJ SOLN
INTRAMUSCULAR | Status: DC | PRN
  Administered 2018-08-22: 20 mL

## 2018-08-22 MED ORDER — METHOCARBAMOL 500 MG PO TABS: 500.0000 mg | ORAL_TABLET | Freq: Four times a day (QID) | ORAL | Status: DC | PRN

## 2018-08-22 MED ORDER — ACETAMINOPHEN 650 MG RE SUPP: 650.0000 mg | RECTAL | Status: DC | PRN

## 2018-08-22 MED ORDER — GLYCOPYRROLATE 0.2 MG/ML IJ SOLN
INTRAMUSCULAR | Status: DC | PRN
  Administered 2018-08-22: 0.2 mg via INTRAVENOUS

## 2018-08-22 MED ORDER — OXYCODONE HCL 5 MG PO TABS: 10.0000 mg | ORAL_TABLET | ORAL | Status: DC | PRN

## 2018-08-22 MED ORDER — ACETAMINOPHEN 10 MG/ML IV SOLN
INTRAVENOUS | Status: DC | PRN
  Administered 2018-08-22: 1000 mg via INTRAVENOUS

## 2018-08-22 MED ORDER — FENTANYL CITRATE (PF) 250 MCG/5ML IJ SOLN
INTRAMUSCULAR | Status: AC
  Filled 2018-08-22: qty 5

## 2018-08-22 MED ORDER — VANCOMYCIN HCL 500 MG IV SOLR
500.0000 mg | Freq: Two times a day (BID) | INTRAVENOUS | Status: DC
  Administered 2018-08-22 – 2018-08-23 (×2): 500 mg via INTRAVENOUS
  Filled 2018-08-22 (×3): qty 500

## 2018-08-22 MED ORDER — DEXAMETHASONE SODIUM PHOSPHATE 10 MG/ML IJ SOLN
INTRAMUSCULAR | Status: DC | PRN
  Administered 2018-08-22: 8 mg via INTRAVENOUS

## 2018-08-22 MED ORDER — SENNOSIDES-DOCUSATE SODIUM 8.6-50 MG PO TABS: 1.0000 | ORAL_TABLET | Freq: Every evening | ORAL | Status: DC | PRN

## 2018-08-22 MED ORDER — DEXAMETHASONE SODIUM PHOSPHATE 10 MG/ML IJ SOLN
INTRAMUSCULAR | Status: AC
  Filled 2018-08-22: qty 1

## 2018-08-22 MED ORDER — ONDANSETRON HCL 4 MG/2ML IJ SOLN: 4.0000 mg | Freq: Four times a day (QID) | INTRAMUSCULAR | Status: DC | PRN

## 2018-08-22 MED ORDER — PHENOL 1.4 % MT LIQD
1.0000 | OROMUCOSAL | Status: DC | PRN
  Filled 2018-08-22: qty 177

## 2018-08-22 MED ORDER — VANCOMYCIN HCL IN DEXTROSE 750-5 MG/150ML-% IV SOLN
750.0000 mg | Freq: Once | INTRAVENOUS | Status: AC
  Administered 2018-08-22: 750 mg via INTRAVENOUS
  Filled 2018-08-22: qty 150

## 2018-08-22 MED ORDER — SUCCINYLCHOLINE CHLORIDE 20 MG/ML IJ SOLN
INTRAMUSCULAR | Status: AC
  Filled 2018-08-22: qty 1

## 2018-08-22 MED ORDER — METHYLPREDNISOLONE ACETATE 40 MG/ML IJ SUSP
INTRAMUSCULAR | Status: DC | PRN
  Administered 2018-08-22: 40 mg

## 2018-08-22 MED ORDER — FENTANYL CITRATE (PF) 100 MCG/2ML IJ SOLN
INTRAMUSCULAR | Status: DC | PRN
  Administered 2018-08-22: 50 ug via INTRAVENOUS

## 2018-08-22 MED ORDER — KETAMINE HCL 50 MG/ML IJ SOLN
INTRAMUSCULAR | Status: DC | PRN
  Administered 2018-08-22: 10 mg via INTRAMUSCULAR
  Administered 2018-08-22 (×2): 25 mg via INTRAMUSCULAR

## 2018-08-22 MED ORDER — ONDANSETRON HCL 4 MG/2ML IJ SOLN
INTRAMUSCULAR | Status: DC | PRN
  Administered 2018-08-22: 4 mg via INTRAVENOUS

## 2018-08-22 MED ORDER — ATORVASTATIN CALCIUM 20 MG PO TABS
40.0000 mg | ORAL_TABLET | Freq: Every day | ORAL | Status: DC
  Administered 2018-08-23: 40 mg via ORAL
  Filled 2018-08-22: qty 2

## 2018-08-22 MED ORDER — EPHEDRINE SULFATE 50 MG/ML IJ SOLN
INTRAMUSCULAR | Status: AC
  Filled 2018-08-22: qty 1

## 2018-08-22 MED ORDER — SODIUM CHLORIDE 0.9% FLUSH
3.0000 mL | Freq: Two times a day (BID) | INTRAVENOUS | Status: DC
  Administered 2018-08-22 – 2018-08-23 (×2): 3 mL via INTRAVENOUS

## 2018-08-22 MED ORDER — BUPIVACAINE LIPOSOME 1.3 % IJ SUSP
INTRAMUSCULAR | Status: AC
  Filled 2018-08-22: qty 20

## 2018-08-22 MED ORDER — THROMBIN 5000 UNITS EX SOLR
CUTANEOUS | Status: DC | PRN
  Administered 2018-08-22: 5000 [IU] via TOPICAL

## 2018-08-22 MED ORDER — THROMBIN 5000 UNITS EX SOLR
CUTANEOUS | Status: AC
  Filled 2018-08-22: qty 5000

## 2018-08-22 MED ORDER — LACTATED RINGERS IV SOLN
INTRAVENOUS | Status: DC
  Administered 2018-08-22 (×2): via INTRAVENOUS

## 2018-08-22 MED ORDER — METOPROLOL TARTRATE 25 MG PO TABS
12.5000 mg | ORAL_TABLET | Freq: Two times a day (BID) | ORAL | Status: DC
Start: 2018-08-23 — End: 2018-08-22

## 2018-08-22 MED ORDER — PROPOFOL 500 MG/50ML IV EMUL
INTRAVENOUS | Status: AC
  Filled 2018-08-22: qty 50

## 2018-08-22 MED ORDER — PHENYLEPHRINE HCL 10 MG/ML IJ SOLN
INTRAMUSCULAR | Status: DC | PRN
  Administered 2018-08-22: 100 ug via INTRAVENOUS

## 2018-08-22 MED ORDER — OXYCODONE HCL 5 MG PO TABS
5.0000 mg | ORAL_TABLET | ORAL | Status: DC | PRN
  Administered 2018-08-22: 5 mg via ORAL
  Filled 2018-08-22: qty 1

## 2018-08-22 MED ORDER — BACITRACIN 50000 UNITS IM SOLR
INTRAMUSCULAR | Status: AC
  Filled 2018-08-22: qty 1

## 2018-08-22 MED ORDER — SODIUM CHLORIDE 0.9 % IV SOLN
INTRAVENOUS | Status: DC | PRN
  Administered 2018-08-22: 40 mL

## 2018-08-22 MED ORDER — SODIUM CHLORIDE 0.9 % IR SOLN
Status: DC | PRN
  Administered 2018-08-22: 1000 mL

## 2018-08-22 MED ORDER — ACETAMINOPHEN 500 MG PO TABS
1000.0000 mg | ORAL_TABLET | Freq: Four times a day (QID) | ORAL | Status: AC
  Administered 2018-08-22 – 2018-08-23 (×3): 1000 mg via ORAL
  Filled 2018-08-22 (×4): qty 2

## 2018-08-22 MED ORDER — TIMOLOL MALEATE 0.25 % OP SOLN
1.0000 [drp] | Freq: Every day | OPHTHALMIC | Status: DC
  Administered 2018-08-23: 1 [drp] via OPHTHALMIC
  Filled 2018-08-22: qty 5

## 2018-08-22 MED ORDER — MENTHOL 3 MG MT LOZG
1.0000 | LOZENGE | OROMUCOSAL | Status: DC | PRN
  Filled 2018-08-22: qty 9

## 2018-08-22 MED ORDER — HYDROMORPHONE HCL 1 MG/ML IJ SOLN: 0.5000 mg | INTRAMUSCULAR | Status: DC | PRN

## 2018-08-22 MED ORDER — METHYLPREDNISOLONE ACETATE 40 MG/ML IJ SUSP
INTRAMUSCULAR | Status: AC
  Filled 2018-08-22: qty 1

## 2018-08-22 MED ORDER — LORATADINE 10 MG PO TABS: 10.0000 mg | ORAL_TABLET | Freq: Every day | ORAL | Status: DC | PRN

## 2018-08-22 MED ORDER — ROCURONIUM BROMIDE 50 MG/5ML IV SOLN
INTRAVENOUS | Status: AC
  Filled 2018-08-22: qty 1

## 2018-08-22 MED ORDER — VANCOMYCIN HCL 1000 MG IV SOLR
INTRAVENOUS | Status: DC | PRN
  Administered 2018-08-22: 1000 mg via TOPICAL

## 2018-08-22 MED ORDER — MONTELUKAST SODIUM 10 MG PO TABS
10.0000 mg | ORAL_TABLET | Freq: Every day | ORAL | Status: DC
  Administered 2018-08-23: 10 mg via ORAL
  Filled 2018-08-22: qty 1

## 2018-08-22 MED ORDER — ROCURONIUM BROMIDE 100 MG/10ML IV SOLN
INTRAVENOUS | Status: DC | PRN
  Administered 2018-08-22: 15 mg via INTRAVENOUS

## 2018-08-22 MED ORDER — ONDANSETRON HCL 4 MG/2ML IJ SOLN: 4.0000 mg | Freq: Once | INTRAMUSCULAR | Status: DC | PRN

## 2018-08-22 MED ORDER — SCOPOLAMINE 1 MG/3DAYS TD PT72
1.0000 | MEDICATED_PATCH | TRANSDERMAL | Status: DC
  Administered 2018-08-22: 1.5 mg via TRANSDERMAL

## 2018-08-22 MED ORDER — SODIUM CHLORIDE 0.9 % IV SOLN
INTRAVENOUS | Status: DC
  Administered 2018-08-22: 16:00:00 via INTRAVENOUS

## 2018-08-22 MED ORDER — ONDANSETRON HCL 4 MG/2ML IJ SOLN
INTRAMUSCULAR | Status: AC
  Filled 2018-08-22: qty 2

## 2018-08-22 MED ORDER — SCOPOLAMINE 1 MG/3DAYS TD PT72: 1.0000 | MEDICATED_PATCH | TRANSDERMAL | Status: DC

## 2018-08-22 MED ORDER — ACETAMINOPHEN 325 MG PO TABS: 650.0000 mg | ORAL_TABLET | ORAL | Status: DC | PRN

## 2018-08-22 MED ORDER — SODIUM CHLORIDE 0.9 % IV SOLN
1.0000 g | Freq: Two times a day (BID) | INTRAVENOUS | Status: DC
  Administered 2018-08-22 – 2018-08-23 (×2): 1 g via INTRAVENOUS
  Filled 2018-08-22 (×4): qty 1

## 2018-08-22 MED ORDER — CEFAZOLIN SODIUM-DEXTROSE 1-4 GM/50ML-% IV SOLN
1.0000 g | Freq: Three times a day (TID) | INTRAVENOUS | Status: DC
  Administered 2018-08-22: 1 g via INTRAVENOUS
  Filled 2018-08-22 (×3): qty 50

## 2018-08-22 MED ORDER — METHOCARBAMOL 1000 MG/10ML IJ SOLN
500.0000 mg | Freq: Four times a day (QID) | INTRAVENOUS | Status: DC | PRN
  Filled 2018-08-22: qty 5

## 2018-08-22 MED ORDER — BUPIVACAINE HCL (PF) 0.5 % IJ SOLN
INTRAMUSCULAR | Status: AC
  Filled 2018-08-22: qty 30

## 2018-08-22 MED ORDER — SODIUM CHLORIDE 0.9 % IV SOLN: 250.0000 mL | INTRAVENOUS | Status: DC

## 2018-08-22 MED ORDER — SCOPOLAMINE 1 MG/3DAYS TD PT72
MEDICATED_PATCH | TRANSDERMAL | Status: AC
  Filled 2018-08-22: qty 1

## 2018-08-22 MED ORDER — ACETAMINOPHEN 10 MG/ML IV SOLN
INTRAVENOUS | Status: AC
  Filled 2018-08-22: qty 100

## 2018-08-22 MED ORDER — EVICEL 2 ML EX KIT
PACK | CUTANEOUS | Status: DC | PRN
  Administered 2018-08-22: 2 mL

## 2018-08-22 MED ORDER — BUPIVACAINE-EPINEPHRINE (PF) 0.5% -1:200000 IJ SOLN
INTRAMUSCULAR | Status: DC | PRN
  Administered 2018-08-22: 7 mL

## 2018-08-22 MED ORDER — DILTIAZEM HCL ER COATED BEADS 180 MG PO CP24
180.0000 mg | ORAL_CAPSULE | Freq: Two times a day (BID) | ORAL | Status: DC
  Administered 2018-08-22 – 2018-08-23 (×2): 180 mg via ORAL
  Filled 2018-08-22 (×3): qty 1

## 2018-08-22 MED ORDER — MIDAZOLAM HCL 2 MG/2ML IJ SOLN
INTRAMUSCULAR | Status: AC
  Filled 2018-08-22: qty 2

## 2018-08-22 MED ORDER — SODIUM CHLORIDE 0.9% FLUSH: 3.0000 mL | INTRAVENOUS | Status: DC | PRN

## 2018-08-22 MED ORDER — PANTOPRAZOLE SODIUM 40 MG PO TBEC
40.0000 mg | DELAYED_RELEASE_TABLET | Freq: Every day | ORAL | Status: DC
  Administered 2018-08-23: 40 mg via ORAL
  Filled 2018-08-22: qty 1

## 2018-08-22 MED ORDER — SUCCINYLCHOLINE CHLORIDE 20 MG/ML IJ SOLN
INTRAMUSCULAR | Status: DC | PRN
  Administered 2018-08-22: 60 mg via INTRAVENOUS

## 2018-08-22 MED ORDER — METOPROLOL TARTRATE 25 MG PO TABS
12.5000 mg | ORAL_TABLET | Freq: Two times a day (BID) | ORAL | Status: DC
  Administered 2018-08-22 – 2018-08-23 (×2): 12.5 mg via ORAL
  Filled 2018-08-22: qty 1

## 2018-08-22 SURGICAL SUPPLY — 59 items
BUR NEURO DRILL SOFT 3.0X3.8M (BURR) ×3 IMPLANT
CANISTER SUCT 1200ML W/VALVE (MISCELLANEOUS) ×6 IMPLANT
CHLORAPREP W/TINT 26ML (MISCELLANEOUS) ×6 IMPLANT
CNTNR SPEC 2.5X3XGRAD LEK (MISCELLANEOUS) ×1
CONT SPEC 4OZ STER OR WHT (MISCELLANEOUS) ×2
CONTAINER SPEC 2.5X3XGRAD LEK (MISCELLANEOUS) ×1 IMPLANT
COUNTER NEEDLE 20/40 LG (NEEDLE) ×3 IMPLANT
COVER LIGHT HANDLE STERIS (MISCELLANEOUS) ×6 IMPLANT
COVER WAND RF STERILE (DRAPES) ×3 IMPLANT
CUP MEDICINE 2OZ PLAST GRAD ST (MISCELLANEOUS) ×6 IMPLANT
DERMABOND ADVANCED (GAUZE/BANDAGES/DRESSINGS) ×2
DERMABOND ADVANCED .7 DNX12 (GAUZE/BANDAGES/DRESSINGS) ×1 IMPLANT
DRAPE C-ARM 42X72 X-RAY (DRAPES) ×6 IMPLANT
DRAPE LAPAROTOMY 100X77 ABD (DRAPES) ×3 IMPLANT
DRAPE MICROSCOPE SPINE 48X150 (DRAPES) ×3 IMPLANT
DRAPE POUCH INSTRU U-SHP 10X18 (DRAPES) ×3 IMPLANT
DRAPE SURG 17X11 SM STRL (DRAPES) ×12 IMPLANT
ELECT CAUTERY BLADE TIP 2.5 (TIP) ×3
ELECT EZSTD 165MM 6.5IN (MISCELLANEOUS)
ELECT REM PT RETURN 9FT ADLT (ELECTROSURGICAL) ×3
ELECTRODE CAUTERY BLDE TIP 2.5 (TIP) ×1 IMPLANT
ELECTRODE EZSTD 165MM 6.5IN (MISCELLANEOUS) IMPLANT
ELECTRODE REM PT RTRN 9FT ADLT (ELECTROSURGICAL) ×1 IMPLANT
FRAME EYE SHIELD (PROTECTIVE WEAR) ×6 IMPLANT
GLOVE BIO SURGEON STRL SZ 6.5 (GLOVE) ×4 IMPLANT
GLOVE BIO SURGEONS STRL SZ 6.5 (GLOVE) ×2
GLOVE BIOGEL PI IND STRL 7.0 (GLOVE) ×1 IMPLANT
GLOVE BIOGEL PI INDICATOR 7.0 (GLOVE) ×2
GLOVE SURG SYN 8.5  E (GLOVE) ×6
GLOVE SURG SYN 8.5 E (GLOVE) ×3 IMPLANT
GOWN SRG XL LVL 3 NONREINFORCE (GOWNS) ×1 IMPLANT
GOWN STRL NON-REIN TWL XL LVL3 (GOWNS) ×2
GOWN STRL REUS W/TWL MED LVL3 (GOWN DISPOSABLE) ×3 IMPLANT
GRADUATE 1200CC STRL 31836 (MISCELLANEOUS) ×3 IMPLANT
GRAFT DURAGEN MATRIX 1WX1L (Tissue) ×3 IMPLANT
KIT SPINAL PRONEVIEW (KITS) ×3 IMPLANT
KNIFE BAYONET SHORT DISCETOMY (MISCELLANEOUS) IMPLANT
MARKER SKIN DUAL TIP RULER LAB (MISCELLANEOUS) ×3 IMPLANT
NDL SAFETY ECLIPSE 18X1.5 (NEEDLE) ×1 IMPLANT
NEEDLE HYPO 18GX1.5 SHARP (NEEDLE) ×2
NEEDLE HYPO 22GX1.5 SAFETY (NEEDLE) ×3 IMPLANT
NS IRRIG 1000ML POUR BTL (IV SOLUTION) ×3 IMPLANT
PACK LAMINECTOMY NEURO (CUSTOM PROCEDURE TRAY) ×3 IMPLANT
PAD ARMBOARD 7.5X6 YLW CONV (MISCELLANEOUS) ×3 IMPLANT
SPOGE SURGIFLO 8M (HEMOSTASIS) ×2
SPONGE SURGIFLO 8M (HEMOSTASIS) ×1 IMPLANT
SUT DVC VLOC 3-0 CL 6 P-12 (SUTURE) ×3 IMPLANT
SUT VIC AB 0 CT1 27 (SUTURE) ×2
SUT VIC AB 0 CT1 27XCR 8 STRN (SUTURE) ×1 IMPLANT
SUT VIC AB 2-0 CT1 18 (SUTURE) ×3 IMPLANT
SYR 10ML LL (SYRINGE) ×3 IMPLANT
SYR 20CC LL (SYRINGE) ×3 IMPLANT
SYR 30ML LL (SYRINGE) ×6 IMPLANT
SYR 3ML LL SCALE MARK (SYRINGE) ×3 IMPLANT
TOWEL OR 17X26 4PK STRL BLUE (TOWEL DISPOSABLE) ×9 IMPLANT
TUBE MATRX SPINL 18MM 4CM DISP (INSTRUMENTS) ×2
TUBE METRX SPINAL 18X4 DISP (INSTRUMENTS) ×1 IMPLANT
TUBING CONNECTING 10 (TUBING) ×2 IMPLANT
TUBING CONNECTING 10' (TUBING) ×1

## 2018-08-22 NOTE — Progress Notes (Signed)
Procedure: :L2-5 lumbar decompression  Procedure date: 08/22/2018 Diagnosis: lumbar radiculopathy  History: Mary Thornton is POD0 s/p L2-5 lumbar decompression for lumbar radiculopathy. She is feeling well. Right leg pain has resolved, but right hip pain persists.  Denies any significant back pain at this time.   Physical Exam: Vitals:   08/22/18 1847 08/22/18 1931  BP: 135/82 133/86  Pulse: 72 76  Resp:  18  Temp: (!) 97.5 F (36.4 C) (!) 97.5 F (36.4 C)  SpO2: 97% 94%    AA Ox3 Strength:5/5 throughout except right IS 4+ Sensation: intact and symmetric.  Data:  No results for input(s): NA, K, CL, CO2, BUN, CREATININE, LABGLOM, GLUCOSE, CALCIUM in the last 168 hours. No results for input(s): AST, ALT, ALKPHOS in the last 168 hours.  Invalid input(s): TBILI   No results for input(s): WBC, HGB, HCT, PLT in the last 168 hours. No results for input(s): APTT, INR in the last 168 hours.       Other tests/results: No imaging reviewed.   Assessment/Plan:  Mary Thornton is POD0 s/p L2-5 lumbar decompression. She is recovering well and lower extremity symptoms present prior to surgery have resolved. Dr. Izora Ribas has discussed potential surgical wound contamination. ID has been consulted for recommendations. Post op ancef provided.  Will continue to monitor.   - mobilize - pain control - DVT prophylaxis - PTOT  Marin Olp PA-C Department of Neurosurgery

## 2018-08-22 NOTE — Anesthesia Preprocedure Evaluation (Signed)
Anesthesia Evaluation  Patient identified by MRN, date of birth, ID band Patient awake    Reviewed: Allergy & Precautions, NPO status , Patient's Chart, lab work & pertinent test results, reviewed documented beta blocker date and time   History of Anesthesia Complications (+) PONV, PROLONGED EMERGENCE and history of anesthetic complications  Airway Mallampati: III       Dental   Pulmonary asthma , neg sleep apnea, neg COPD,           Cardiovascular hypertension, Pt. on medications + CAD  (-) Past MI and (-) CHF + dysrhythmias Supra Ventricular Tachycardia (-) Valvular Problems/Murmurs     Neuro/Psych neg Seizures    GI/Hepatic Neg liver ROS, GERD (Barrett's esophagous)  Medicated and Controlled,  Endo/Other  neg diabetes  Renal/GU negative Renal ROS     Musculoskeletal   Abdominal   Peds  Hematology   Anesthesia Other Findings   Reproductive/Obstetrics                             Anesthesia Physical Anesthesia Plan  ASA: III  Anesthesia Plan: General   Post-op Pain Management:    Induction: Intravenous  PONV Risk Score and Plan: 4 or greater and Scopolamine patch - Pre-op, Propofol infusion, Dexamethasone and Ondansetron  Airway Management Planned: Oral ETT  Additional Equipment:   Intra-op Plan:   Post-operative Plan:   Informed Consent: I have reviewed the patients History and Physical, chart, labs and discussed the procedure including the risks, benefits and alternatives for the proposed anesthesia with the patient or authorized representative who has indicated his/her understanding and acceptance.     Plan Discussed with:   Anesthesia Plan Comments:         Anesthesia Quick Evaluation

## 2018-08-22 NOTE — Op Note (Signed)
Indications: Ms. Gueye is a 77 yo female who presented with neurogenic claudication.  She failed conservative management so elected for surgical intervention.  Findings: severe stenosis at L2-5, abnormal discoloration of L4-5 epidural space consistent with prior hematoma  Preoperative Diagnosis: Lumbar Stenosis with neurogenic claudication Postoperative Diagnosis: same   EBL: 50 ml IVF: 1100 ml Drains: none Disposition: Extubated and Stable to PACU Complications: none  No foley catheter was placed.   Preoperative Note:   Risks of surgery discussed include: infection, bleeding, stroke, coma, death, paralysis, CSF leak, nerve/spinal cord injury, numbness, tingling, weakness, complex regional pain syndrome, recurrent stenosis and/or disc herniation, vascular injury, development of instability, neck/back pain, need for further surgery, persistent symptoms, development of deformity, and the risks of anesthesia. The patient understood these risks and agreed to proceed.  Operative Note:   1. L2-5 lumbar decompression including central laminectomy and bilateral medial facetectomies including foraminotomies  The patient was then brought from the preoperative center with intravenous access established.  The patient underwent general anesthesia and endotracheal tube intubation, and was then rotated on the [place] rail top where all pressure points were appropriately padded.  The skin was then thoroughly cleansed.  Perioperative antibiotic prophylaxis was administered.  Sterile prep and drapes were then applied and a timeout was then observed.  C-arm was brought into the field under sterile conditions and under lateral visualization the L2-5 interspaces were identified and marked.  The incision was marked on the right and injected with local anesthetic. Once this was complete a 6 cm incision was opened with the use of a #10 blade knife.    The metrx tubes were sequentially advanced and confirmed  in position at L4-5. An 49mm by 46mm tube was locked in place to the bed side attachment.  The microscope was then sterilely brought into the field and muscle creep was hemostased with a bipolar and resected with a pituitary rongeur.  A Bovie extender was then used to expose the spinous process and lamina.  Careful attention was placed to not violate the facet capsule. A 3 mm matchstick drill bit was then used to make a hemi-laminotomy trough until the ligamentum flavum was exposed.  This was extended to the base of the spinous process and to the contralateral side to remove all the central bone from each side.  Once this was complete and the underlying ligamentum flavum was visualized, it was dissected with a curette and resected with Kerrison rongeurs.  Extensive ligamentum hypertrophy was noted, requiring a substantial amount of time and care for removal.  The dura was identified and palpated. The kerrison rongeur was then used to remove the medial facet bilaterally until no compression was noted.  A balltip probe was used to confirm decompression of the ipsilateral L5 nerve root.  During removal of the ligamentum flavum, a hole was inadvertentally made in the microscope drape.  It was unclear whether the instrument making the hole was reintroduced into the wound.  That device was handed off the field, gloves changed, and the wound was copiously irrigated.    Additional attention was paid to completion of the contralateral L4-5 foraminotomy until the contralateral traversing nerve root was completely free.  Once this was complete, L4-5 central decompression including medial facetectomy and foraminotomy was confirmed and decompression on both sides was confirmed. No CSF leak was noted.  A Depo-Medrol soaked Gelfoam pledget was placed in the defect.  The wound was copiously irrigated. The tube system was then removed under microscopic visualization  and hemostasis was obtained with a bipolar.    After  performing the decompression at L4-5, the metrx tubes were sequentially advanced and confirmed in position at L3-4. An 72mm by 96mm tube was locked in place to the bed side attachment.  Fluoroscopy was then removed from the field.  The microscope was then sterilely brought into the field and muscle creep was hemostased with a bipolar and resected with a pituitary rongeur.  A Bovie extender was then used to expose the spinous process and lamina.  Careful attention was placed to not violate the facet capsule. A 3 mm matchstick drill bit was then used to make a hemi-laminotomy trough until the ligamentum flavum was exposed.  This was extended to the base of the spinous process and to the contralateral side to remove all the central bone from each side.  Once this was complete and the underlying ligamentum flavum was visualized, it was dissected with a curette and resected with Kerrison rongeurs.  Extensive ligamentum hypertrophy was noted, requiring a substantial amount of time and care for removal.  The dura was identified and palpated. The kerrison rongeur was then used to remove the medial facet bilaterally until no compression was noted.  A balltip probe was used to confirm decompression of the ipsilateral L4 nerve root.  Additional attention was paid to completion of the contralateral foraminotomy until the contralateral L4 nerve root was completely free.  Once this was complete, L3-4 central decompression including medial facetectomy and foraminotomy was confirmed and decompression on both sides was confirmed. A smallCSF leak was noted. This was closed using Duragen and tisseel without incident.  A Depo-Medrol soaked Gelfoam pledget was placed in the defect.  The wound was copiously irrigated. The tube system was then removed under microscopic visualization and hemostasis was obtained with a bipolar.    After performing the decompression at L3-4, the metrx tubes were sequentially advanced and confirmed in  position at L2-3. An 54mm by 2mm tube was locked in place to the bed side attachment.  Fluoroscopy was then removed from the field.  The microscope was then sterilely brought into the field and muscle creep was hemostased with a bipolar and resected with a pituitary rongeur.  A Bovie extender was then used to expose the spinous process and lamina.  Careful attention was placed to not violate the facet capsule. A 3 mm matchstick drill bit was then used to make a hemi-laminotomy trough until the ligamentum flavum was exposed.  This was extended to the base of the spinous process and to the contralateral side to remove all the central bone from each side.  Once this was complete and the underlying ligamentum flavum was visualized, it was dissected with a curette and resected with Kerrison rongeurs.  Extensive ligamentum hypertrophy was noted, requiring a substantial amount of time and care for removal.  The dura was identified and palpated. The kerrison rongeur was then used to remove the medial facet bilaterally until no compression was noted.  A balltip probe was used to confirm decompression of the ipsilateral L3 nerve root.  Additional attention was paid to completion of the contralateral foraminotomy until the contralateral L3 nerve root was completely free.  Once this was complete, L2-3 central decompression including medial facetectomy and foraminotomy was confirmed and decompression on both sides was confirmed. No CSF leak was noted.  A Depo-Medrol soaked Gelfoam pledget was placed in the defect.  The wound was copiously irrigated. The tube system was then removed under microscopic  visualization and hemostasis was obtained with a bipolar.    Each site was irrigated once more.  Vancomycin powder was placed into the wound.  The fascial layer was reapproximated with the use of a 0 Vicryl suture.  Subcutaneous tissue layer was reapproximated using 2-0 Vicryl suture.  3-0 nylon was placed in running  fashion. The skin was then cleansed and dressed.  Patient was then rotated back to the preoperative bed awakened from anesthesia and taken to recovery all counts are correct in this case.  I performed the entire procedure with the assistance of Marin Olp PA as an Pensions consultant.  Leeasia Secrist K. Izora Ribas MD

## 2018-08-22 NOTE — H&P (Signed)
Heart sounds normal no MRG. Chest Clear to Auscultation Bilaterally.  I have reviewed and confirmed my history and physical from 08/09/2018 with no additions or changes. Plan for L2-5 lumbar decompression.  Risks and benefits reviewed.

## 2018-08-22 NOTE — Anesthesia Procedure Notes (Addendum)
Procedure Name: Intubation Date/Time: 08/22/2018 10:47 AM Performed by: Lia Foyer, RN Pre-anesthesia Checklist: Patient identified, Emergency Drugs available, Suction available, Patient being monitored and Timeout performed Patient Re-evaluated:Patient Re-evaluated prior to induction Oxygen Delivery Method: Circle system utilized Preoxygenation: Pre-oxygenation with 100% oxygen Induction Type: IV induction Ventilation: Mask ventilation without difficulty Laryngoscope Size: McGraph and 3 Grade View: Grade I Tube type: Oral Tube size: 7.0 mm Number of attempts: 3 Airway Equipment and Method: Video-laryngoscopy,  Stylet and LTA kit utilized Placement Confirmation: positive ETCO2 and breath sounds checked- equal and bilateral Secured at: 21 cm Tube secured with: Tape Dental Injury: Teeth and Oropharynx as per pre-operative assessment  Difficulty Due To: Difficult Airway- due to reduced neck mobility, Difficult Airway- due to anterior larynx and Difficult Airway- due to limited oral opening Future Recommendations: Recommend- induction with short-acting agent, and alternative techniques readily available

## 2018-08-22 NOTE — Anesthesia Post-op Follow-up Note (Signed)
Anesthesia QCDR form completed.        

## 2018-08-22 NOTE — Transfer of Care (Signed)
Immediate Anesthesia Transfer of Care Note  Patient: Mary Thornton  Procedure(s) Performed: LUMBAR LAMINECTOMY/DECOMPRESSION MICRODISCECTOMY 1 LEVEL-L2-5 (N/A Back)  Patient Location: PACU  Anesthesia Type:General  Level of Consciousness: drowsy  Airway & Oxygen Therapy: Patient Spontanous Breathing and Patient connected to face mask oxygen  Post-op Assessment: Report given to RN, Post -op Vital signs reviewed and stable and Patient moving all extremities  Post vital signs: stable  Last Vitals:  Vitals Value Taken Time  BP 149/85 08/22/2018  1:49 PM  Temp    Pulse 62 08/22/2018  1:54 PM  Resp 18 08/22/2018  1:54 PM  SpO2 100 % 08/22/2018  1:54 PM  Vitals shown include unvalidated device data.  Last Pain:  Vitals:   08/22/18 0757  TempSrc: Oral  PainSc: 5          Complications: No apparent anesthesia complications

## 2018-08-22 NOTE — Anesthesia Postprocedure Evaluation (Signed)
Anesthesia Post Note  Patient: Mary Thornton  Procedure(s) Performed: LUMBAR LAMINECTOMY/DECOMPRESSION MICRODISCECTOMY 1 LEVEL-L2-5 (N/A Back)  Patient location during evaluation: PACU Anesthesia Type: General Level of consciousness: awake and alert Pain management: pain level controlled Vital Signs Assessment: post-procedure vital signs reviewed and stable Respiratory status: spontaneous breathing and respiratory function stable Cardiovascular status: stable Anesthetic complications: no     Last Vitals:  Vitals:   08/22/18 1522 08/22/18 1630  BP: (!) 162/71 135/79  Pulse: (!) 54 67  Resp:    Temp:  (!) 36.3 C  SpO2: 100% 98%    Last Pain:  Vitals:   08/22/18 1630  TempSrc: Oral  PainSc:                  KEPHART,WILLIAM K

## 2018-08-23 ENCOUNTER — Encounter: Payer: Self-pay | Admitting: Neurosurgery

## 2018-08-23 DIAGNOSIS — M48062 Spinal stenosis, lumbar region with neurogenic claudication: Secondary | ICD-10-CM | POA: Diagnosis not present

## 2018-08-23 DIAGNOSIS — M25551 Pain in right hip: Secondary | ICD-10-CM | POA: Diagnosis not present

## 2018-08-23 DIAGNOSIS — Z7982 Long term (current) use of aspirin: Secondary | ICD-10-CM | POA: Diagnosis not present

## 2018-08-23 DIAGNOSIS — Z791 Long term (current) use of non-steroidal anti-inflammatories (NSAID): Secondary | ICD-10-CM | POA: Diagnosis not present

## 2018-08-23 DIAGNOSIS — I10 Essential (primary) hypertension: Secondary | ICD-10-CM | POA: Diagnosis not present

## 2018-08-23 DIAGNOSIS — Z79899 Other long term (current) drug therapy: Secondary | ICD-10-CM | POA: Diagnosis not present

## 2018-08-23 DIAGNOSIS — E785 Hyperlipidemia, unspecified: Secondary | ICD-10-CM | POA: Diagnosis not present

## 2018-08-23 DIAGNOSIS — I2510 Atherosclerotic heart disease of native coronary artery without angina pectoris: Secondary | ICD-10-CM | POA: Diagnosis not present

## 2018-08-23 DIAGNOSIS — M5416 Radiculopathy, lumbar region: Secondary | ICD-10-CM | POA: Diagnosis not present

## 2018-08-23 MED ORDER — SULFAMETHOXAZOLE-TRIMETHOPRIM 400-80 MG PO TABS: 1.0000 | ORAL_TABLET | Freq: Two times a day (BID) | ORAL | 0 refills | Status: DC

## 2018-08-23 MED ORDER — METHOCARBAMOL 500 MG PO TABS: 500.0000 mg | ORAL_TABLET | Freq: Four times a day (QID) | ORAL | 0 refills | Status: DC | PRN

## 2018-08-23 MED ORDER — VANCOMYCIN HCL IN DEXTROSE 750-5 MG/150ML-% IV SOLN
750.0000 mg | INTRAVENOUS | Status: DC
  Filled 2018-08-23: qty 150

## 2018-08-23 MED ORDER — CIPROFLOXACIN HCL 500 MG PO TABS: 500.0000 mg | ORAL_TABLET | Freq: Two times a day (BID) | ORAL | 0 refills | Status: DC

## 2018-08-23 MED ORDER — OXYCODONE HCL 5 MG PO TABS: 5.0000 mg | ORAL_TABLET | ORAL | 0 refills | Status: DC | PRN

## 2018-08-23 NOTE — Progress Notes (Signed)
Pharmacy Antibiotic Note  Mary Thornton is a 77 y.o. female admitted on 08/22/2018 with surgical prophylaxis.  Pharmacy has been consulted for Vancomcyin and Cefepime dosing. Patient was initially started on Vancomycin 500mg  IV q12h and Cefepime 1g IV q12h postop.  Plan: Vancomycin 750 IV every 18 hours.  Goal trough 15-20 mcg/mL. Will check a Vancomycin trough prior to 4th dose  Continue Cefepime 1g IV q12h    Height: 5\' 3"  (160 cm) Weight: 110 lb 6.4 oz (50.1 kg) IBW/kg (Calculated) : 52.4  Temp (24hrs), Avg:97.6 F (36.4 C), Min:97.4 F (36.3 C), Max:98.1 F (36.7 C)  No results for input(s): WBC, CREATININE, LATICACIDVEN, VANCOTROUGH, VANCOPEAK, VANCORANDOM, GENTTROUGH, GENTPEAK, GENTRANDOM, TOBRATROUGH, TOBRAPEAK, TOBRARND, AMIKACINPEAK, AMIKACINTROU, AMIKACIN in the last 168 hours.  Estimated Creatinine Clearance: 46.6 mL/min (by C-G formula based on SCr of 0.63 mg/dL).    Allergies  Allergen Reactions  . Ibuprofen Other (See Comments)    History of esophagogastritis and barretts esophagus No bc powder or like products  . Chlorhexidine Rash  . Penicillins Itching and Rash    Has patient had a PCN reaction causing immediate rash, facial/tongue/throat swelling, SOB or lightheadedness with hypotension: No Has patient had a PCN reaction causing severe rash involving mucus membranes or skin necrosis: No Has patient had a PCN reaction that required hospitalization: No Has patient had a PCN reaction occurring within the last 10 years: No If all of the above answers are "NO", then may proceed with Cephalosporin use.  . Sulfa Antibiotics Rash    Antimicrobials this admission: Vancomycin 10/23 >>  Cefepime 10/23 >>   Thank you for allowing pharmacy to be a part of this patient's care.  Paulina Fusi, PharmD, BCPS 08/23/2018 2:42 PM

## 2018-08-23 NOTE — Progress Notes (Signed)
Procedure: :L2-5 lumbar decompression  Procedure date: 08/22/2018 Diagnosis: lumbar radiculopathy  History: POD1: She is recovering well. Right lower extremity symptoms have resolved. Complains of 4-5/10 back pain at surgical site.  Denies any new lower extremity pain/numbness/tingling. She has ambulated with walker with OT. Voided and eaten without issue.   POD0: s/p L2-5 lumbar decompression for lumbar radiculopathy. She is feeling well. Right leg pain has resolved, but right hip pain persists.  Denies any significant back pain at this time.   Physical Exam: Vitals:   08/23/18 0351 08/23/18 0816  BP: (!) 144/67 (!) 159/72  Pulse: (!) 50 (!) 58  Resp: 16 18  Temp: (!) 97.4 F (36.3 C) 98.1 F (36.7 C)  SpO2: 97% 100%    AA Ox3 Strength:5/5 throughout Sensation: intact and symmetric Skin: Minimal bleeding on dressing. Site cleaned and redressed. No active bleeding or clear drainage. Sutures intact Data:  No results for input(s): NA, K, CL, CO2, BUN, CREATININE, LABGLOM, GLUCOSE, CALCIUM in the last 168 hours. No results for input(s): AST, ALT, ALKPHOS in the last 168 hours.  Invalid input(s): TBILI   No results for input(s): WBC, HGB, HCT, PLT in the last 168 hours. No results for input(s): APTT, INR in the last 168 hours.       Other tests/results: No imaging reviewed.   Assessment/Plan:  Mary Thornton is POD1 s/p L2-5 lumbar decompression. She is recovering well and lower extremity symptoms present prior to surgery remain resolved. [facility] is [facility] with [facility] for [facility] health needs. Pain adequately controlled with current regimen. Will await infections disease recommendations for continued care.   - mobilize - pain control - DVT prophylaxis  Marin Olp PA-C Department of Neurosurgery

## 2018-08-23 NOTE — Discharge Instructions (Signed)

## 2018-08-23 NOTE — Discharge Summary (Signed)
Procedure: :L2-5 lumbar decompression  Procedure date: 08/22/2018 Diagnosis: lumbar radiculopathy  History: POD1: She is recovering well. Right lower extremity symptoms have resolved. Complains of 4-5/10 back pain at surgical site.  Denies any new lower extremity pain/numbness/tingling. She has ambulated with walker with OT. Voided and eaten without issue.   POD0: s/p L2-5 lumbar decompression for lumbar radiculopathy. She is feeling well. Right leg pain has resolved, but right hip pain persists.  Denies any significant back pain at this time.   Physical Exam: Vitals:   08/23/18 1043 08/23/18 1522  BP:  105/80  Pulse: 66 (!) 55  Resp:  18  Temp:  98.6 F (37 C)  SpO2:  98%    AA Ox3 Strength:5/5 throughout Sensation: intact and symmetric Skin: Minimal bleeding on dressing. Site cleaned and redressed. No active bleeding or clear drainage. Sutures intact Data:  No results for input(s): NA, K, CL, CO2, BUN, CREATININE, LABGLOM, GLUCOSE, CALCIUM in the last 168 hours. No results for input(s): AST, ALT, ALKPHOS in the last 168 hours.  Invalid input(s): TBILI   No results for input(s): WBC, HGB, HCT, PLT in the last 168 hours. No results for input(s): APTT, INR in the last 168 hours.       Other tests/results: No imaging reviewed.   Assessment/Plan:  Mary Thornton is POD1 s/p L2-5 lumbar decompression. She is recovering well and lower extremity symptoms present prior to surgery remain resolved. [facility] is [facility] with [facility] for home health needs. Pain adequately controlled with current regimen. Will provide 1 week of oral antibiotics for infection prophylaxis. She will follow up in clinic in 2 weeks to monitor progress.   Marin Olp PA-C Department of Neurosurgery

## 2018-08-23 NOTE — Plan of Care (Signed)
  Problem: Coping: Goal: Level of anxiety will decrease Outcome: Completed/Met   Problem: Elimination: Goal: Will not experience complications related to bowel motility Outcome: Completed/Met   Problem: Pain Managment: Goal: General experience of comfort will improve Outcome: Completed/Met   Problem: Safety: Goal: Ability to remain free from injury will improve Outcome: Completed/Met   Problem: Skin Integrity: Goal: Risk for impaired skin integrity will decrease Outcome: Completed/Met

## 2018-08-23 NOTE — Evaluation (Signed)
Occupational Therapy Evaluation Patient Details Name: Mary Thornton MRN: [id number] DOB: Nov 02, 1940 Today's Date: 08/23/2018    History of Present Illness Pt admitted for lumbar radiculopathy. PMH includes asthma, HTN and GERD.   Clinical Impression   Pt seen for OT evaluation this date. Pt is a 77 y/o female who presented with lumbar radiculopathy. Prior to admission, pt enjoyed gardening, decorating cakes and baking. Pt lives alone in a one level home with no steps to enter. Currently, pt demonstrates impairments in strength, ROM, balance, awareness of precautions, pain, and decreased safety awareness resulting in MIN A for LB  dressing and bathing tasks and CGA for functional mobility with RW. Pt required VC to adhere to back precautions during ADL and mobility tasks throughout session.  Pt instructed in log roll 3x to ensure adherence to spinal precautions. Son in the room to understand procedure. Pt very eager to get moving and required VC to slow down for increased safety. Pt/son educated in compression sock mgt, use of AE, spinal precautions and  home/routine modifications for increased safety. Pt/son verbalized understanding. Pt will benefit from skilled OT services to address noted impairments and functional deficits in order to maximize safety and independence and minimize caregiver burden and falls prevention. OT recommends HHOT (and HHPT) upon discharge. RNCM notified of recommendations.     Follow Up Recommendations  Home health OT    Equipment Recommendations  Tub/shower seat(reacher, grab bar by toilet)    Recommendations for Other Services       Precautions / Restrictions Precautions Precautions: Cervical Precaution Booklet Issued: Yes (comment) Restrictions Other Position/Activity Restrictions: No bending, twisting, lifting, arching      Mobility Bed Mobility Overal bed mobility: Needs Assistance Bed Mobility: Sidelying to Sit;Sit to Sidelying;Rolling Rolling:  Min guard Sidelying to sit: Min guard     Sit to sidelying: Min guard General bed mobility comments: Pt educated and demonstrated log roll adhering to spinal precautions. VC and physical contact required when learning procedure. Pt completed 3 log rolls demonstrating improvements each time.   Transfers Overall transfer level: Needs assistance Equipment used: Rolling walker (2 wheeled) Transfers: Sit to/from Stand Sit to Stand: Min guard         General transfer comment: Pt ambulated from EOB to bathroom to complete toileting task. VC required for added safety.     Balance Overall balance assessment: Needs assistance Sitting-balance support: Feet unsupported;No upper extremity supported Sitting balance-Leahy Scale: Fair     Standing balance support: Bilateral upper extremity supported Standing balance-Leahy Scale: Poor Standing balance comment: Upon standing, pt unstable and required heavy use of BUE for balance.                            ADL either performed or assessed with clinical judgement   ADL Overall ADL's : Needs assistance/impaired Eating/Feeding: Independent;Sitting   Grooming: Standing;Supervision/safety Grooming Details (indicate cue type and reason): Adhering to spinal precautions Upper Body Bathing: Sitting;Supervision/ safety Upper Body Bathing Details (indicate cue type and reason): Adhering to spinal precautions Lower Body Bathing: Minimal assistance;Cueing for back precautions;Cueing for safety;Sit to/from stand   Upper Body Dressing : Sitting;Supervision/safety   Lower Body Dressing: Sit to/from stand;Cueing for back precautions;With adaptive equipment;Minimal assistance;Cueing for safety   Toilet Transfer: Min guard;RW;Grab bars           Functional mobility during ADLs: Min guard;Rolling walker;Cueing for safety General ADL Comments: Pt a bit impulsive and eager to get  moving.      Vision Baseline Vision/History: Wears  glasses Wears Glasses: Reading only Patient Visual Report: No change from baseline       Perception     Praxis      Pertinent Vitals/Pain Pain Assessment: 0-10 Pain Score: 5  Pain Intervention(s): Limited activity within patient's tolerance;Monitored during session;Repositioned;RN gave pain meds during session     Hand Dominance     Extremity/Trunk Assessment Upper Extremity Assessment Upper Extremity Assessment: RUE deficits/detail RUE Deficits / Details: Pt 4/5 in BUE with MMT, grip strength WFL RUE Sensation: WNL   Lower Extremity Assessment Lower Extremity Assessment: Generalized weakness(Pt 4/5 in BLE with MMT, RLE with slight buckle upon initial stand. Pt able to self correct.  )       Communication Communication Communication: No difficulties   Cognition Arousal/Alertness: Awake/alert Behavior During Therapy: WFL for tasks assessed/performed Overall Cognitive Status: Within Functional Limits for tasks assessed                                     General Comments       Exercises Other Exercises Other Exercises: Pt/son educated on spinal precautions through demonstration and handout provided. Other Exercises: Pt/son educated on log roll for safe bed mobility Other Exercises: Pt/son educated on use of AE/DME for completion of ADL and IADL tasks.  Other Exercises: Pt/son educated on compression sock mgt including donning/doffing, wear schedule and positioning.    Shoulder Instructions      Home Living Family/patient expects to be discharged to:: Private residence Living Arrangements: Alone Available Help at Discharge: Family;Available 24 hours/day;Other (Comment)(Adult children will be available 24/7, intermittent home nurse available after children leave) Type of Home: House Home Access: Level entry     Home Layout: One level     Bathroom Shower/Tub: Teacher, early years/pre: Handicapped height     Home Equipment: Harvey -  single point;Walker - 2 wheels;Grab bars - tub/shower;Hand held shower head          Prior Functioning/Environment Level of Independence: Needs assistance  Gait / Transfers Assistance Needed: Pt required use of SPC and 2WW for past several weeks due to pain for ambulation at home and in community. ADL's / Homemaking Assistance Needed: For 4 weeks prior to surgery, pt needed assistance with cooking and cleaning 2/2 to pain. Independent before that time.             OT Problem List: Decreased strength;Impaired balance (sitting and/or standing);Decreased knowledge of precautions;Pain;Decreased range of motion;Decreased safety awareness;Decreased knowledge of use of DME or AE      OT Treatment/Interventions: Self-care/ADL training;DME and/or AE instruction;Therapeutic activities;Therapeutic exercise;Patient/family education;Balance training    OT Goals(Current goals can be found in the care plan section) Acute Rehab OT Goals Patient Stated Goal: to get back to gardening and baking OT Goal Formulation: With patient/family Time For Goal Achievement: 09/06/18 Potential to Achieve Goals: Good ADL Goals Pt Will Perform Lower Body Dressing: with supervision;with adaptive equipment;sitting/lateral leans Additional ADL Goal #1: Pt will independently demonstrate spinal precautions while completing ADL and IADL tasks. Additional ADL Goal #2: Pt will talk back at least 4 home environment modifications that will increase safety and independence in her home. Additional ADL Goal #3: Pt will independently educate caregiver with compression sock mgt including donnng/doffing, wear schedule, and positioning.  OT Frequency: Min 1X/week   Barriers to D/C:  Co-evaluation              AM-PAC PT "6 Clicks" Daily Activity     Outcome Measure Help from another person eating meals?: None Help from another person taking care of personal grooming?: None Help from another person toileting,  which includes using toliet, bedpan, or urinal?: None Help from another person bathing (including washing, rinsing, drying)?: A Little Help from another person to put on and taking off regular upper body clothing?: None Help from another person to put on and taking off regular lower body clothing?: A Little 6 Click Score: 22   End of Session Equipment Utilized During Treatment: Gait belt;Rolling walker  Activity Tolerance: Patient tolerated treatment well Patient left: in chair;with call bell/phone within reach;with chair alarm set;with family/visitor present;with SCD's reapplied  OT Visit Diagnosis: Other abnormalities of gait and mobility (R26.89);Pain Pain - part of body: (lower back)                Time: 5176-1607 OT Time Calculation (min): 55 min Charges:     Jadene Pierini OTS  08/23/2018, 10:27 AM

## 2018-08-24 DIAGNOSIS — R2689 Other abnormalities of gait and mobility: Secondary | ICD-10-CM | POA: Diagnosis not present

## 2018-08-24 DIAGNOSIS — M6281 Muscle weakness (generalized): Secondary | ICD-10-CM | POA: Diagnosis not present

## 2018-08-24 DIAGNOSIS — Z4789 Encounter for other orthopedic aftercare: Secondary | ICD-10-CM | POA: Diagnosis not present

## 2018-08-24 DIAGNOSIS — I10 Essential (primary) hypertension: Secondary | ICD-10-CM | POA: Diagnosis not present

## 2018-08-27 ENCOUNTER — Encounter: Payer: Self-pay | Admitting: Neurosurgery

## 2018-08-27 DIAGNOSIS — Z4789 Encounter for other orthopedic aftercare: Secondary | ICD-10-CM | POA: Diagnosis not present

## 2018-08-27 DIAGNOSIS — I10 Essential (primary) hypertension: Secondary | ICD-10-CM | POA: Diagnosis not present

## 2018-08-27 DIAGNOSIS — R2689 Other abnormalities of gait and mobility: Secondary | ICD-10-CM | POA: Diagnosis not present

## 2018-08-27 DIAGNOSIS — M6281 Muscle weakness (generalized): Secondary | ICD-10-CM | POA: Diagnosis not present

## 2018-08-29 DIAGNOSIS — M6281 Muscle weakness (generalized): Secondary | ICD-10-CM | POA: Diagnosis not present

## 2018-08-29 DIAGNOSIS — I10 Essential (primary) hypertension: Secondary | ICD-10-CM | POA: Diagnosis not present

## 2018-08-29 DIAGNOSIS — R2689 Other abnormalities of gait and mobility: Secondary | ICD-10-CM | POA: Diagnosis not present

## 2018-08-29 DIAGNOSIS — Z4789 Encounter for other orthopedic aftercare: Secondary | ICD-10-CM | POA: Diagnosis not present

## 2018-08-30 ENCOUNTER — Encounter: Payer: Self-pay | Admitting: Neurosurgery

## 2018-08-31 DIAGNOSIS — M6281 Muscle weakness (generalized): Secondary | ICD-10-CM | POA: Diagnosis not present

## 2018-08-31 DIAGNOSIS — I10 Essential (primary) hypertension: Secondary | ICD-10-CM | POA: Diagnosis not present

## 2018-08-31 DIAGNOSIS — Z4789 Encounter for other orthopedic aftercare: Secondary | ICD-10-CM | POA: Diagnosis not present

## 2018-08-31 DIAGNOSIS — R2689 Other abnormalities of gait and mobility: Secondary | ICD-10-CM | POA: Diagnosis not present

## 2018-09-03 DIAGNOSIS — I10 Essential (primary) hypertension: Secondary | ICD-10-CM | POA: Diagnosis not present

## 2018-09-03 DIAGNOSIS — Z4789 Encounter for other orthopedic aftercare: Secondary | ICD-10-CM | POA: Diagnosis not present

## 2018-09-03 DIAGNOSIS — M6281 Muscle weakness (generalized): Secondary | ICD-10-CM | POA: Diagnosis not present

## 2018-09-03 DIAGNOSIS — R2689 Other abnormalities of gait and mobility: Secondary | ICD-10-CM | POA: Diagnosis not present

## 2018-09-05 DIAGNOSIS — Z4789 Encounter for other orthopedic aftercare: Secondary | ICD-10-CM | POA: Diagnosis not present

## 2018-09-05 DIAGNOSIS — M6281 Muscle weakness (generalized): Secondary | ICD-10-CM | POA: Diagnosis not present

## 2018-09-05 DIAGNOSIS — I10 Essential (primary) hypertension: Secondary | ICD-10-CM | POA: Diagnosis not present

## 2018-09-05 DIAGNOSIS — R2689 Other abnormalities of gait and mobility: Secondary | ICD-10-CM | POA: Diagnosis not present

## 2018-09-21 ENCOUNTER — Encounter: Payer: Self-pay | Admitting: Neurosurgery

## 2018-10-03 ENCOUNTER — Ambulatory Visit
Admission: RE | Admit: 2018-10-03 | Discharge: 2018-10-03 | Disposition: A | Discharge status: Home / Self Care | Payer: Medicare HMO | Source: Ambulatory Visit | Attending: Internal Medicine | Admitting: Internal Medicine

## 2018-10-03 DIAGNOSIS — Z1231 Encounter for screening mammogram for malignant neoplasm of breast: Secondary | ICD-10-CM | POA: Diagnosis not present

## 2018-10-15 DIAGNOSIS — I10 Essential (primary) hypertension: Secondary | ICD-10-CM | POA: Diagnosis not present

## 2018-10-15 DIAGNOSIS — I2510 Atherosclerotic heart disease of native coronary artery without angina pectoris: Secondary | ICD-10-CM | POA: Diagnosis not present

## 2018-10-15 DIAGNOSIS — Z79899 Other long term (current) drug therapy: Secondary | ICD-10-CM | POA: Diagnosis not present

## 2018-10-15 DIAGNOSIS — R0781 Pleurodynia: Secondary | ICD-10-CM | POA: Diagnosis not present

## 2018-10-15 DIAGNOSIS — E7800 Pure hypercholesterolemia, unspecified: Secondary | ICD-10-CM | POA: Diagnosis not present

## 2018-10-17 DIAGNOSIS — L03213 Periorbital cellulitis: Secondary | ICD-10-CM | POA: Diagnosis not present

## 2018-12-21 IMAGING — MG DIGITAL SCREENING BILATERAL MAMMOGRAM WITH TOMO AND CAD
6 of 10 series · 6 of 30 positions shown · non-contrast
Comparison: Previous exam(s).

CLINICAL DATA: Screening.

EXAM:
DIGITAL SCREENING BILATERAL MAMMOGRAM WITH TOMO AND CAD

[L CC synth-2D]
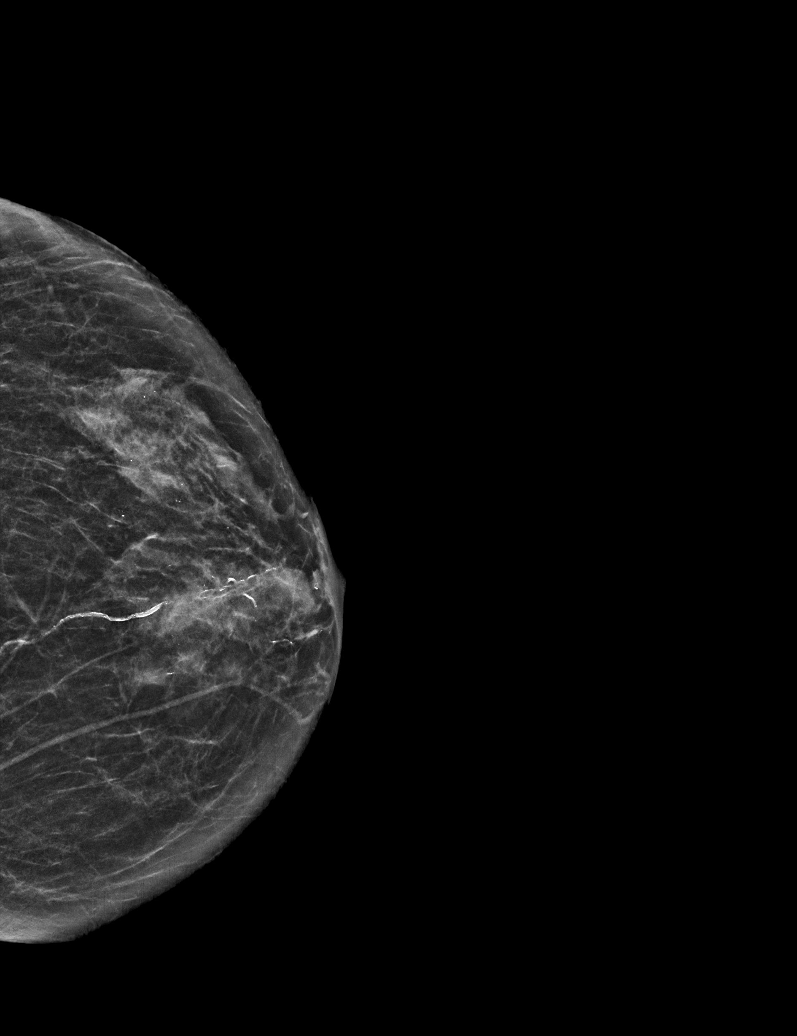

[R MLO synth-2D (1 of 2)]
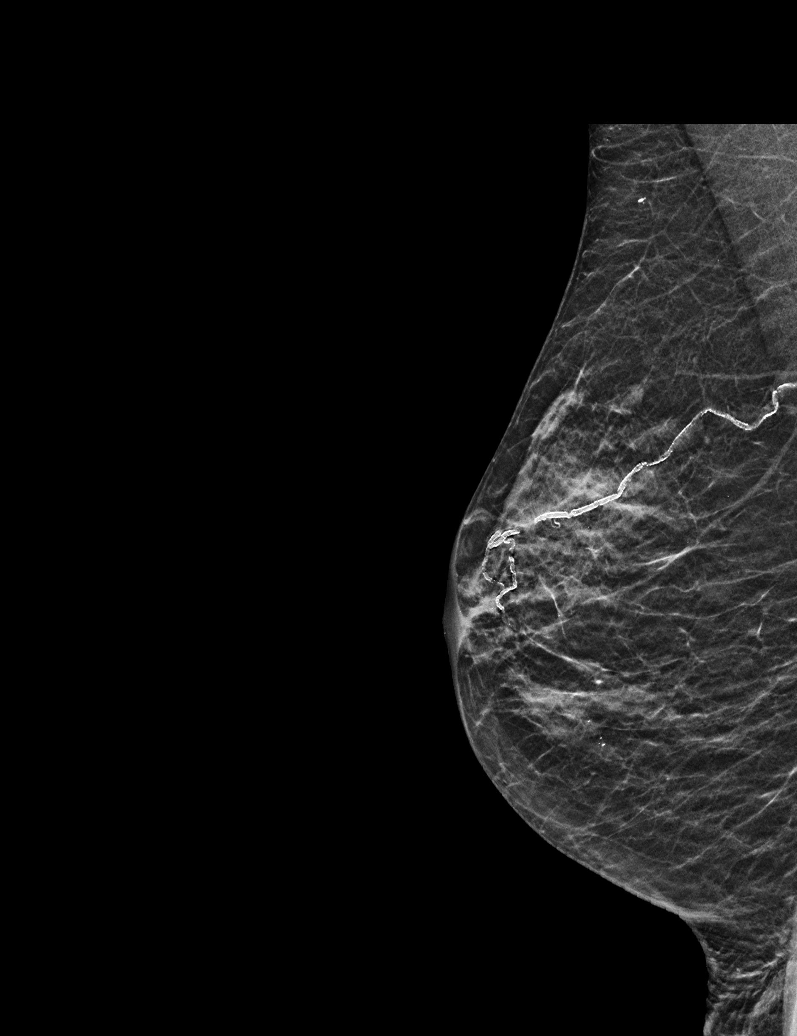

[R MLO synth-2D (2 of 2)]
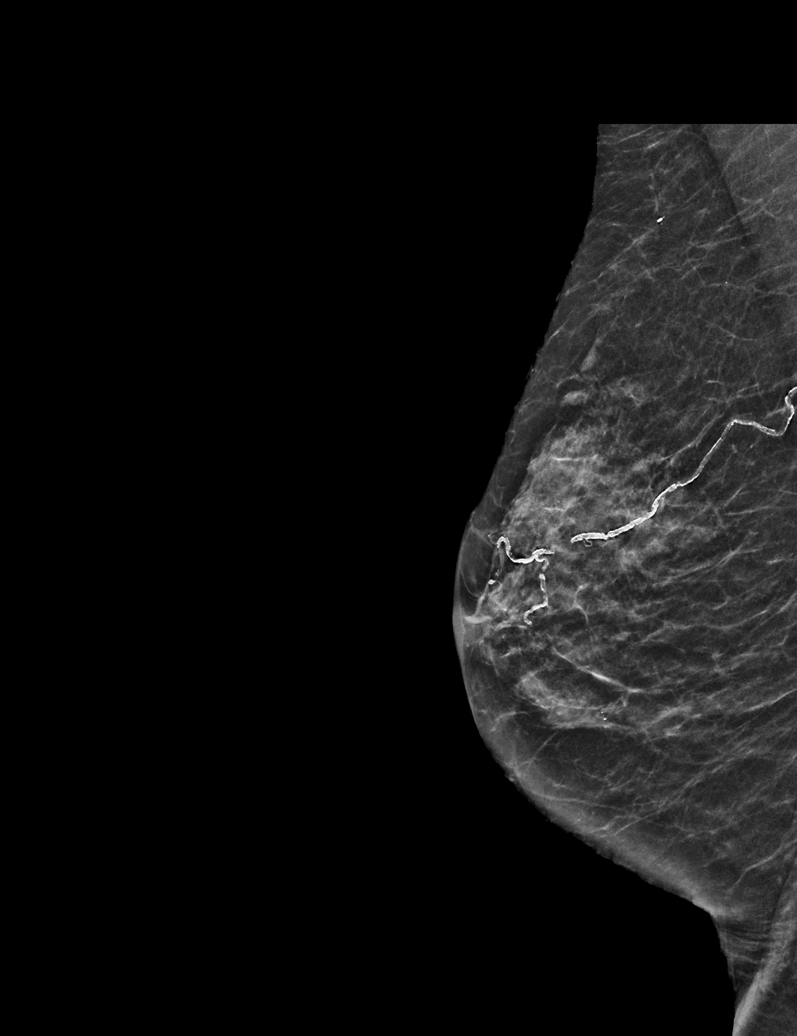

[L MLO synth-2D]
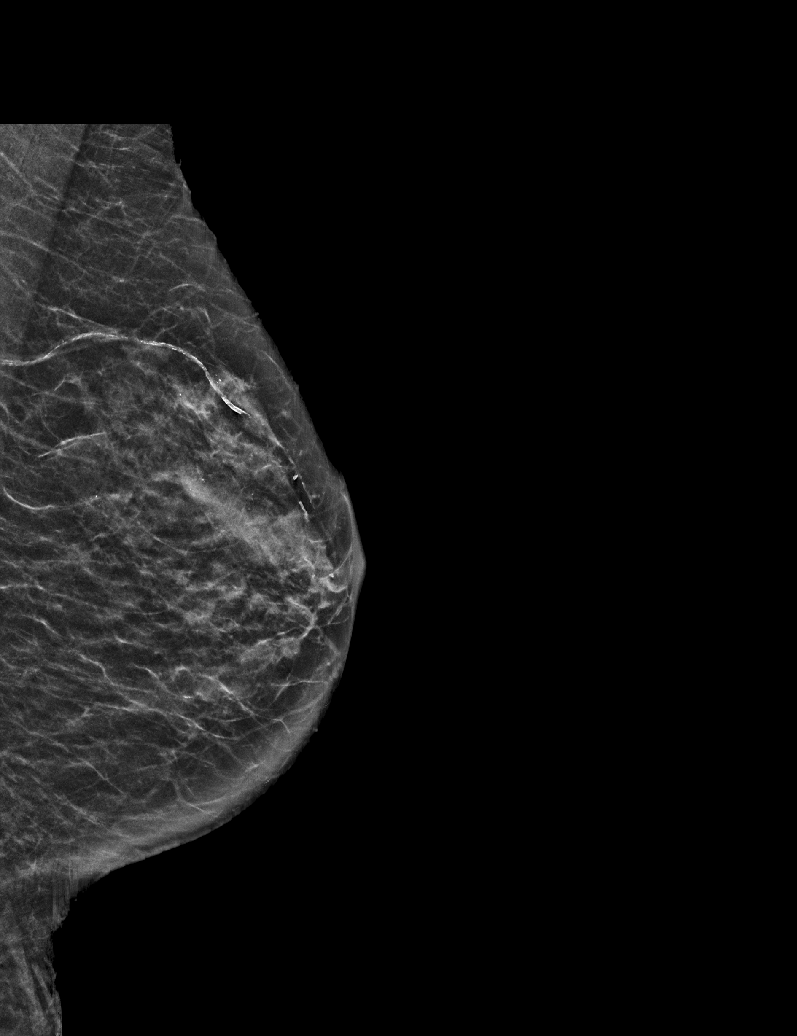

[R CC synth-2D]
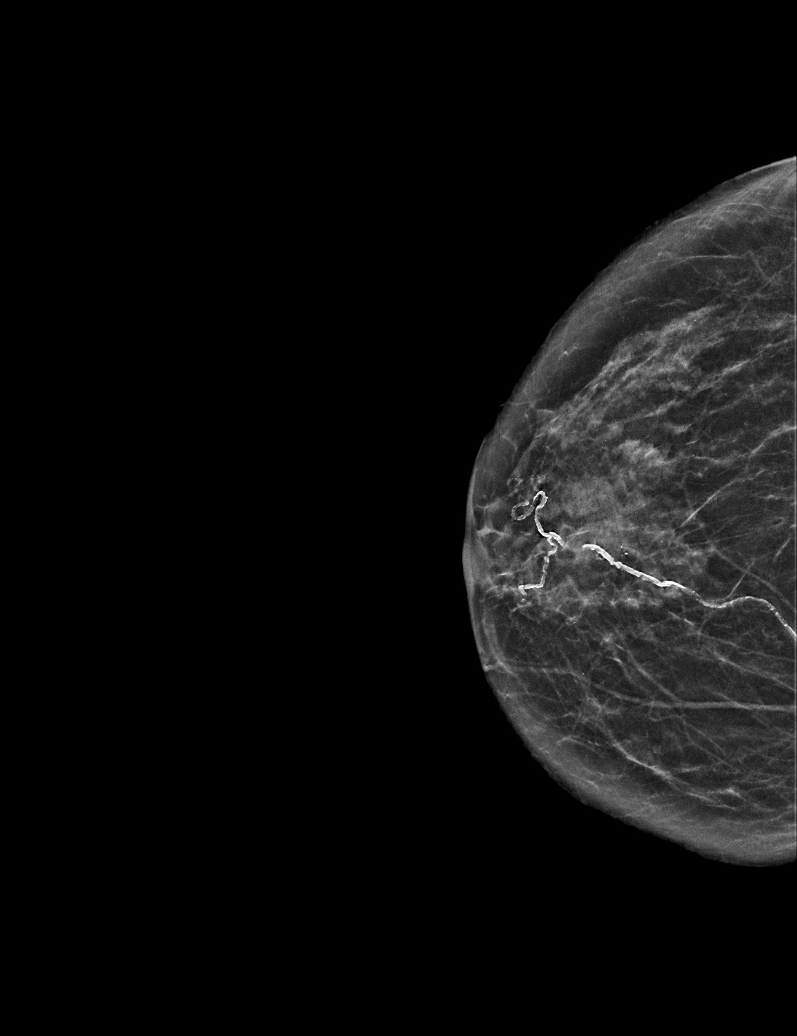

[R CC tomo · tomo slice 21/41.0]
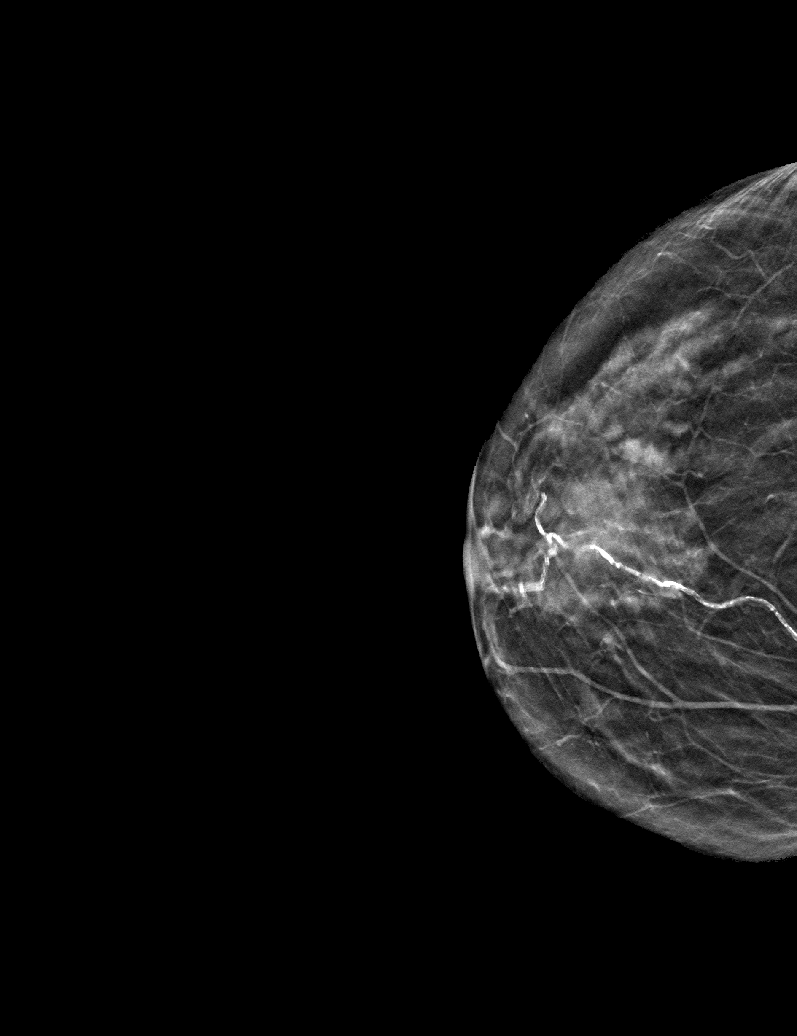

[6 of 30 positions shown; findings below may reference images not displayed]

ACR Breast Density Category c: The breast tissue is heterogeneously
dense, which may obscure small masses.
FINDINGS: There are no findings suspicious for malignancy. Images were
processed with CAD.
IMPRESSION: No mammographic evidence of malignancy. A result letter of this
screening mammogram will be mailed directly to the patient.

RECOMMENDATION:
Screening mammogram in one year. (Code:[id number])

BI-RADS CATEGORY  1: Negative.

## 2019-04-08 DIAGNOSIS — I208 Other forms of angina pectoris: Secondary | ICD-10-CM | POA: Diagnosis not present

## 2019-04-08 DIAGNOSIS — I10 Essential (primary) hypertension: Secondary | ICD-10-CM | POA: Diagnosis not present

## 2019-04-08 DIAGNOSIS — M436 Torticollis: Secondary | ICD-10-CM | POA: Diagnosis not present

## 2019-04-08 DIAGNOSIS — R0602 Shortness of breath: Secondary | ICD-10-CM | POA: Diagnosis not present

## 2019-04-08 DIAGNOSIS — J449 Chronic obstructive pulmonary disease, unspecified: Secondary | ICD-10-CM | POA: Diagnosis not present

## 2019-04-08 DIAGNOSIS — J45909 Unspecified asthma, uncomplicated: Secondary | ICD-10-CM | POA: Diagnosis not present

## 2019-04-08 DIAGNOSIS — K219 Gastro-esophageal reflux disease without esophagitis: Secondary | ICD-10-CM | POA: Diagnosis not present

## 2019-04-08 DIAGNOSIS — R002 Palpitations: Secondary | ICD-10-CM | POA: Diagnosis not present

## 2019-04-08 DIAGNOSIS — M1990 Unspecified osteoarthritis, unspecified site: Secondary | ICD-10-CM | POA: Diagnosis not present

## 2019-04-08 DIAGNOSIS — E782 Mixed hyperlipidemia: Secondary | ICD-10-CM | POA: Diagnosis not present

## 2019-04-18 DIAGNOSIS — I2510 Atherosclerotic heart disease of native coronary artery without angina pectoris: Secondary | ICD-10-CM | POA: Diagnosis not present

## 2019-04-18 DIAGNOSIS — E7800 Pure hypercholesterolemia, unspecified: Secondary | ICD-10-CM | POA: Diagnosis not present

## 2019-04-18 DIAGNOSIS — I10 Essential (primary) hypertension: Secondary | ICD-10-CM | POA: Diagnosis not present

## 2019-04-18 DIAGNOSIS — Z79899 Other long term (current) drug therapy: Secondary | ICD-10-CM | POA: Diagnosis not present

## 2019-06-06 DIAGNOSIS — K2270 Barrett's esophagus without dysplasia: Secondary | ICD-10-CM | POA: Diagnosis not present

## 2019-06-12 DIAGNOSIS — H401334 Pigmentary glaucoma, bilateral, indeterminate stage: Secondary | ICD-10-CM | POA: Diagnosis not present

## 2019-06-19 DIAGNOSIS — H40003 Preglaucoma, unspecified, bilateral: Secondary | ICD-10-CM | POA: Diagnosis not present

## 2019-06-27 DIAGNOSIS — Z79899 Other long term (current) drug therapy: Secondary | ICD-10-CM | POA: Diagnosis not present

## 2019-06-27 DIAGNOSIS — I10 Essential (primary) hypertension: Secondary | ICD-10-CM | POA: Diagnosis not present

## 2019-06-27 DIAGNOSIS — E7800 Pure hypercholesterolemia, unspecified: Secondary | ICD-10-CM | POA: Diagnosis not present

## 2019-07-18 ENCOUNTER — Inpatient Hospital Stay: Payer: Medicare HMO | Attending: Oncology | Admitting: Oncology

## 2019-07-18 ENCOUNTER — Encounter: Payer: Self-pay | Admitting: Oncology

## 2019-07-18 ENCOUNTER — Other Ambulatory Visit: Payer: Self-pay

## 2019-07-18 ENCOUNTER — Encounter (INDEPENDENT_AMBULATORY_CARE_PROVIDER_SITE_OTHER): Payer: Self-pay

## 2019-07-18 ENCOUNTER — Inpatient Hospital Stay: Payer: Medicare HMO

## 2019-07-18 VITALS — BP 162/68 | HR 48 | Wt 106.0 lb

## 2019-07-18 DIAGNOSIS — E785 Hyperlipidemia, unspecified: Secondary | ICD-10-CM | POA: Insufficient documentation

## 2019-07-18 DIAGNOSIS — I10 Essential (primary) hypertension: Secondary | ICD-10-CM | POA: Insufficient documentation

## 2019-07-18 DIAGNOSIS — J45909 Unspecified asthma, uncomplicated: Secondary | ICD-10-CM | POA: Insufficient documentation

## 2019-07-18 DIAGNOSIS — M1990 Unspecified osteoarthritis, unspecified site: Secondary | ICD-10-CM | POA: Insufficient documentation

## 2019-07-18 DIAGNOSIS — Z79899 Other long term (current) drug therapy: Secondary | ICD-10-CM | POA: Insufficient documentation

## 2019-07-18 DIAGNOSIS — K219 Gastro-esophageal reflux disease without esophagitis: Secondary | ICD-10-CM | POA: Insufficient documentation

## 2019-07-18 DIAGNOSIS — G629 Polyneuropathy, unspecified: Secondary | ICD-10-CM | POA: Diagnosis not present

## 2019-07-18 DIAGNOSIS — D472 Monoclonal gammopathy: Secondary | ICD-10-CM | POA: Diagnosis not present

## 2019-07-18 DIAGNOSIS — I2510 Atherosclerotic heart disease of native coronary artery without angina pectoris: Secondary | ICD-10-CM | POA: Diagnosis not present

## 2019-07-18 LAB — CBC WITH DIFFERENTIAL/PLATELET
Abs Immature Granulocytes: 0 10*3/uL (ref 0.00–0.07)
Basophils Absolute: 0 10*3/uL (ref 0.0–0.1)
Basophils Relative: 0 %
Eosinophils Absolute: 0.2 10*3/uL (ref 0.0–0.5)
Eosinophils Relative: 5 %
HCT: 38.6 % (ref 36.0–46.0)
Hemoglobin: 12.6 g/dL (ref 12.0–15.0)
Immature Granulocytes: 0 %
Lymphocytes Relative: 23 %
Lymphs Abs: 1 10*3/uL (ref 0.7–4.0)
MCH: 30.8 pg (ref 26.0–34.0)
MCHC: 32.6 g/dL (ref 30.0–36.0)
MCV: 94.4 fL (ref 80.0–100.0)
Monocytes Absolute: 0.5 10*3/uL (ref 0.1–1.0)
Monocytes Relative: 12 %
Neutro Abs: 2.7 10*3/uL (ref 1.7–7.7)
Neutrophils Relative %: 60 %
Platelets: 206 10*3/uL (ref 150–400)
RBC: 4.09 MIL/uL (ref 3.87–5.11)
RDW: 13.3 % (ref 11.5–15.5)
WBC: 4.4 10*3/uL (ref 4.0–10.5)
nRBC: 0 % (ref 0.0–0.2)

## 2019-07-18 NOTE — Progress Notes (Signed)
Patient here today for new evaluation regarding abnormal M spike. Patient denies concerns today.

## 2019-07-19 LAB — KAPPA/LAMBDA LIGHT CHAINS
Kappa free light chain: 37.6 mg/L — ABNORMAL HIGH (ref 3.3–19.4)
Kappa, lambda light chain ratio: 3.62 — ABNORMAL HIGH (ref 0.26–1.65)
Lambda free light chains: 10.4 mg/L (ref 5.7–26.3)

## 2019-07-19 LAB — BETA 2 MICROGLOBULIN, SERUM: Beta-2 Microglobulin: 2.2 mg/L (ref 0.6–2.4)

## 2019-07-19 NOTE — Progress Notes (Signed)
Hematology/Oncology Consult note [facility] Telephone:([phone] [phone]   Patient Care Team: Idelle Crouch, MD as PCP - General (Internal Medicine)  REFERRING PROVIDER: Benay Spice, PA-C  CHIEF COMPLAINTS/REASON FOR VISIT:  Evaluation of abnormal SPEP results.  HISTORY OF PRESENTING ILLNESS:  Mary Thornton is a 78 y.o. female who was seen in consultation at the request of Benay Spice, PA-C for evaluation of abnormal SPEP results.   Patient recently had work up done at Neurologist's office for lower extremity neuropathy.. Labs reviewed,  03/18/2019 SPEP showed 0.2 M spike, repeat SPEP on 06/27/2019 showed M protein of 0.2. IFE showed IgG Kappa monoclonal protein.  No aggravating or alleviated factors.  Associated signs or symptoms:  Neuropathy: Cool sensation in her face since around June 2019.  Symptoms have been gradually worsening.  She is not diabetic.  Also numbness in her bilateral feet since around October 2019 after lumbar decompression surgery.  Symptoms are worse when she lays down. Denies weight loss, fever chills, night sweating   Bone pain: Chronic back pain.   Review of Systems  Constitutional: Negative for appetite change, chills, fatigue and fever.  HENT:   Negative for hearing loss and voice change.   Eyes: Negative for eye problems.  Respiratory: Negative for chest tightness and cough.   Cardiovascular: Negative for chest pain.  Gastrointestinal: Negative for abdominal distention, abdominal pain and blood in stool.  Endocrine: Negative for hot flashes.  Genitourinary: Negative for difficulty urinating and frequency.   Musculoskeletal: Positive for back pain. Negative for arthralgias.  Skin: Negative for itching and rash.  Neurological: Positive for numbness. Negative for extremity weakness.  Hematological: Negative for adenopathy.  Psychiatric/Behavioral: Negative for confusion.     MEDICAL  HISTORY:  Past Medical History:  Diagnosis Date  . Arthritis   . Asthma   . Barrett esophagus   . Complication of anesthesia    very hard to wake up. feels like she had too much medicine. also severe vomitting  . Coronary artery disease   . Diverticulosis   . Dysrhythmia    psvt  . Environmental allergies   . Fibrocystic breast disease   . GERD (gastroesophageal reflux disease)   . Glaucoma   . Hyperlipemia   . Hyperlipidemia   . Hypertension   . Macular degeneration   . 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 PONV (postoperative nausea and vomiting)   . PSVT (paroxysmal supraventricular tachycardia) ([place])   . Reactive airway disease   . Recurrent sinusitis   . Reflux esophagitis   . Seasonal allergies   . Torticollis   . Torticollis     SURGICAL HISTORY: Past Surgical History:  Procedure Laterality Date  . ABDOMINAL HYSTERECTOMY    . BREAST BIOPSY Left 1980   neg  . CATARACT EXTRACTION Right 03/13/2018   Dr. Herbert Deaner  . COLONOSCOPY WITH PROPOFOL N/A 06/23/2015   Procedure: COLONOSCOPY WITH PROPOFOL;  Surgeon: [name], MD;  Location: [facility] ENDOSCOPY;  Service: Endoscopy;  Laterality: N/A;  . ESOPHAGOGASTRODUODENOSCOPY (EGD) WITH PROPOFOL N/A 06/23/2015   Procedure: ESOPHAGOGASTRODUODENOSCOPY (EGD) WITH PROPOFOL;  Surgeon: [name], MD;  Location: [facility] ENDOSCOPY;  Service: Endoscopy;  Laterality: N/A;  . ESOPHAGOGASTRODUODENOSCOPY (EGD) WITH PROPOFOL N/A 11/28/2017   Procedure: ESOPHAGOGASTRODUODENOSCOPY (EGD) WITH PROPOFOL;  Surgeon: [name], MD;  Location: [facility] ENDOSCOPY;  Service: Endoscopy;  Laterality: N/A;  . ESOPHAGOGASTRODUODENOSCOPY (EGD) WITH PROPOFOL N/A 01/18/2018   Procedure: ESOPHAGOGASTRODUODENOSCOPY (  EGD) WITH PROPOFOL;  Surgeon: [name], MD;  Location: [facility] ENDOSCOPY;  Service: Endoscopy;  Laterality: N/A;  . JOINT REPLACEMENT Left 2009   total knee replacement  . KNEE ARTHROSCOPY    . LUMBAR  LAMINECTOMY/DECOMPRESSION MICRODISCECTOMY N/A 08/22/2018   Procedure: LUMBAR LAMINECTOMY/DECOMPRESSION MICRODISCECTOMY 1 LEVEL-L2-5;  Surgeon: Meade Maw, MD;  Location: [facility] ORS;  Service: Neurosurgery;  Laterality: N/A;  . Torticollis surgery x 2      SOCIAL HISTORY: Social History   Socioeconomic History  . Marital status: Widowed    Spouse name: Not on file  . Number of children: Not on file  . Years of education: Not on file  . Highest education level: Not on file  Occupational History  . 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 Financial resource strain: Not on file  . Food insecurity    Worry: Not on file    Inability: Not on file  . Transportation needs    Medical: Not on file    Non-medical: Not on file  Tobacco Use  . Smoking status: Never Smoker  . Smokeless tobacco: Never Used  Substance and Sexual Activity  . Alcohol use: Never    Frequency: Never  . Drug use: Never  . Sexual activity: Not Currently    Birth control/protection: Surgical  Lifestyle  . Physical activity    Days per week: Not on file    Minutes per session: Not on file  . Stress: Not on file  Relationships  . Social Herbalist on phone: Not on file    Gets together: Not on file    Attends religious service: Not on file    Active member of club or organization: Not on file    Attends meetings of clubs or organizations: Not on file    Relationship status: Not on file  . Intimate partner violence    Fear of current or ex partner: Not on file    Emotionally abused: Not on file    Physically abused: Not on file    Forced sexual activity: Not on file  Other Topics Concern  . Not on file  Social History Narrative  . Not on file    FAMILY HISTORY: Family History  Problem Relation Age of Onset  . Heart disease Mother   . Heart disease Sister   . Cancer Neg Hx   . Diabetes Neg Hx   . Breast cancer Neg Hx   . Amblyopia Neg Hx   . Blindness Neg Hx    . Cataracts Neg Hx   . Glaucoma Neg Hx   . Macular degeneration Neg Hx   . Retinal detachment Neg Hx   . Strabismus Neg Hx   . Retinitis pigmentosa Neg Hx     ALLERGIES:  is allergic to ibuprofen; chlorhexidine; penicillins; and sulfa antibiotics.  MEDICATIONS:  Current Outpatient Medications  Medication Sig Dispense Refill  . calcium-vitamin D (OSCAL WITH D) 500-200 MG-UNIT tablet Take 1 tablet by mouth daily.    Marland Kitchen CARTIA XT 180 MG 24 hr capsule Take 180 mg by mouth 2 (two) times daily.    . methocarbamol (ROBAXIN) 500 MG tablet Take 1 tablet (500 mg total) by mouth every 6 (six) hours as needed for muscle spasms. 90 tablet 0  . montelukast (SINGULAIR) 10 MG tablet Take 10 mg by mouth daily.     . Multiple Vitamin (MULTIVITAMIN) tablet Take 1 tablet by mouth daily at 3  pm.     . Multiple Vitamins-Minerals (PRESERVISION AREDS PO) Take 2 capsules by mouth 2 (two) times daily. Take one capsule in the morning and one in the evening    . pantoprazole (PROTONIX) 40 MG tablet Take 40 mg by mouth daily before breakfast.     . timolol (TIMOPTIC) 0.25 % ophthalmic solution Place 1 drop into both eyes daily.    Marland Kitchen atorvastatin (LIPITOR) 40 MG tablet Take 1 tablet (40 mg total) by mouth daily. 30 tablet 0  . loratadine (CLARITIN) 10 MG tablet Take 1 tablet (10 mg total) by mouth daily. (Patient taking differently: Take 10 mg by mouth daily as needed for allergies. ) 30 tablet 0  . metoprolol tartrate (LOPRESSOR) 25 MG tablet Take 0.5 tablets (12.5 mg total) by mouth 2 (two) times daily. (Patient taking differently: Take 25 mg by mouth 2 (two) times daily. ) 30 tablet 0   No current facility-administered medications for this visit.      PHYSICAL EXAMINATION: ECOG PERFORMANCE STATUS: 1 - Symptomatic but completely ambulatory Vitals:   07/18/19 0932  BP: (!) 162/68  Pulse: (!) 48   Filed Weights   07/18/19 0932  Weight: 106 lb (48.1 kg)    Physical Exam   LABORATORY DATA:  I have  reviewed the data as listed Lab Results  Component Value Date   WBC 4.4 07/18/2019   HGB 12.6 07/18/2019   HCT 38.6 07/18/2019   MCV 94.4 07/18/2019   PLT 206 07/18/2019   Recent Labs    08/14/18 1550  NA 140  K 4.0  CL 104  CO2 30  GLUCOSE 96  BUN 19  CREATININE 0.63  CALCIUM 8.8*  GFRNONAA >60  GFRAA >60   Iron/TIBC/Ferritin/ %Sat No results found for: IRON, TIBC, FERRITIN, IRONPCTSAT    RADIOGRAPHIC STUDIES: I have personally reviewed the radiological images as listed and agreed with the findings in the report.  No results found.   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 I discussed with patient about the diagnosis of IgG MGUS which is an asymptomatic condition which has a small risk of progression to malignant plasma cell dyscrasia or lymphoproliferative disorder.  For now I recommend observation.  I recommend checking multiple myeloma panel [SPEP, IFE, light chain ratio], UPEP, UFE.  Beta 2 microglobulin. Follow-up in 6 months for repeat blood work. Orders Placed This Encounter  Procedures  . Multiple Myeloma Panel (SPEP&IFE w/QIG)    Standing Status:   Future    Number of Occurrences:   1    Standing Expiration Date:   07/17/2020  . Kappa/lambda light chains    Standing Status:   Future    Number of Occurrences:   1    Standing Expiration Date:   07/17/2020  . CBC with Differential/Platelet    Standing Status:   Future    Number of Occurrences:   1    Standing Expiration Date:   07/17/2020  . Beta 2 microglobulin, serum    Standing Status:   Future    Number of Occurrences:   1    Standing Expiration Date:   07/17/2020  . UPEP/UIFE/Light Chains/TP, 24-Hr Ur    Standing Status:   Future    Standing Expiration Date:   07/17/2020    All questions were answered. The patient knows to call the clinic with any problems questions or concerns.  Cc Benay Spice,  PA-C   Return of visit:  6 months.  Thank you for this kind referral and the opportunity to participate in the care of this patient. A copy of today's note is routed to referring provider  Total face to face encounter time for this patient visit was 45 min. >50% of the time was  spent in counseling and coordination of care.    Earlie Server, MD, PhD 07/19/2019

## 2019-07-20 LAB — MULTIPLE MYELOMA PANEL, SERUM
Albumin SerPl Elph-Mcnc: 4.4 g/dL (ref 2.9–4.4)
Albumin/Glob SerPl: 1.9 — ABNORMAL HIGH (ref 0.7–1.7)
Alpha 1: 0.2 g/dL (ref 0.0–0.4)
Alpha2 Glob SerPl Elph-Mcnc: 0.8 g/dL (ref 0.4–1.0)
B-Globulin SerPl Elph-Mcnc: 0.8 g/dL (ref 0.7–1.3)
Gamma Glob SerPl Elph-Mcnc: 0.6 g/dL (ref 0.4–1.8)
Globulin, Total: 2.4 g/dL (ref 2.2–3.9)
IgA: 115 mg/dL (ref 64–422)
IgG (Immunoglobin G), Serum: 736 mg/dL (ref 586–1602)
IgM (Immunoglobulin M), Srm: 48 mg/dL (ref 26–217)
M Protein SerPl Elph-Mcnc: 0.2 g/dL — ABNORMAL HIGH
Total Protein ELP: 6.8 g/dL (ref 6.0–8.5)

## 2019-07-22 DIAGNOSIS — I10 Essential (primary) hypertension: Secondary | ICD-10-CM | POA: Diagnosis not present

## 2019-07-22 DIAGNOSIS — K219 Gastro-esophageal reflux disease without esophagitis: Secondary | ICD-10-CM | POA: Diagnosis not present

## 2019-07-22 DIAGNOSIS — J45909 Unspecified asthma, uncomplicated: Secondary | ICD-10-CM | POA: Diagnosis not present

## 2019-07-22 DIAGNOSIS — E785 Hyperlipidemia, unspecified: Secondary | ICD-10-CM | POA: Diagnosis not present

## 2019-07-22 DIAGNOSIS — I2510 Atherosclerotic heart disease of native coronary artery without angina pectoris: Secondary | ICD-10-CM | POA: Diagnosis not present

## 2019-07-22 DIAGNOSIS — Z79899 Other long term (current) drug therapy: Secondary | ICD-10-CM | POA: Diagnosis not present

## 2019-07-22 DIAGNOSIS — G629 Polyneuropathy, unspecified: Secondary | ICD-10-CM | POA: Diagnosis not present

## 2019-07-22 DIAGNOSIS — M1990 Unspecified osteoarthritis, unspecified site: Secondary | ICD-10-CM | POA: Diagnosis not present

## 2019-07-22 DIAGNOSIS — D472 Monoclonal gammopathy: Secondary | ICD-10-CM | POA: Diagnosis not present

## 2019-07-23 ENCOUNTER — Other Ambulatory Visit: Payer: Self-pay

## 2019-07-23 DIAGNOSIS — Z1239 Encounter for other screening for malignant neoplasm of breast: Secondary | ICD-10-CM | POA: Diagnosis not present

## 2019-07-23 DIAGNOSIS — G608 Other hereditary and idiopathic neuropathies: Secondary | ICD-10-CM | POA: Diagnosis not present

## 2019-07-23 DIAGNOSIS — I10 Essential (primary) hypertension: Secondary | ICD-10-CM | POA: Diagnosis not present

## 2019-07-23 DIAGNOSIS — I471 Supraventricular tachycardia: Secondary | ICD-10-CM | POA: Diagnosis not present

## 2019-07-23 DIAGNOSIS — I2510 Atherosclerotic heart disease of native coronary artery without angina pectoris: Secondary | ICD-10-CM | POA: Diagnosis not present

## 2019-07-23 DIAGNOSIS — Z8261 Family history of arthritis: Secondary | ICD-10-CM | POA: Diagnosis not present

## 2019-07-23 DIAGNOSIS — D472 Monoclonal gammopathy: Secondary | ICD-10-CM | POA: Diagnosis not present

## 2019-07-23 DIAGNOSIS — E7800 Pure hypercholesterolemia, unspecified: Secondary | ICD-10-CM | POA: Diagnosis not present

## 2019-07-23 DIAGNOSIS — M1990 Unspecified osteoarthritis, unspecified site: Secondary | ICD-10-CM | POA: Diagnosis not present

## 2019-07-23 DIAGNOSIS — R202 Paresthesia of skin: Secondary | ICD-10-CM | POA: Diagnosis not present

## 2019-07-23 DIAGNOSIS — Z79899 Other long term (current) drug therapy: Secondary | ICD-10-CM | POA: Diagnosis not present

## 2019-07-24 LAB — UPEP/UIFE/LIGHT CHAINS/TP, 24-HR UR
% BETA, Urine: 0 %
ALPHA 1 URINE: 0 %
Albumin, U: 0 %
Alpha 2, Urine: 0 %
Free Kappa Lt Chains,Ur: 1.47 mg/L (ref 0.63–113.79)
Free Kappa/Lambda Ratio: >2.3 (ref 1.03–31.76)
Free Lambda Lt Chains,Ur: <0.64 mg/L (ref 0.47–11.77)
GAMMA GLOBULIN URINE: 0 %
Total Protein, Urine-Ur/day: <156 mg/24 hr — ABNORMAL HIGH (ref 30–150)
Total Protein, Urine: <4 mg/dL
Total Volume: 3900

## 2019-07-26 ENCOUNTER — Other Ambulatory Visit: Payer: Self-pay | Admitting: Internal Medicine

## 2019-07-26 DIAGNOSIS — Z1231 Encounter for screening mammogram for malignant neoplasm of breast: Secondary | ICD-10-CM

## 2019-08-01 DIAGNOSIS — M79672 Pain in left foot: Secondary | ICD-10-CM | POA: Diagnosis not present

## 2019-08-01 DIAGNOSIS — M79642 Pain in left hand: Secondary | ICD-10-CM | POA: Diagnosis not present

## 2019-08-01 DIAGNOSIS — M8949 Other hypertrophic osteoarthropathy, multiple sites: Secondary | ICD-10-CM | POA: Diagnosis not present

## 2019-08-01 DIAGNOSIS — M79671 Pain in right foot: Secondary | ICD-10-CM | POA: Diagnosis not present

## 2019-08-01 DIAGNOSIS — G8929 Other chronic pain: Secondary | ICD-10-CM | POA: Diagnosis not present

## 2019-08-01 DIAGNOSIS — M79641 Pain in right hand: Secondary | ICD-10-CM | POA: Diagnosis not present

## 2019-08-01 DIAGNOSIS — G629 Polyneuropathy, unspecified: Secondary | ICD-10-CM | POA: Diagnosis not present

## 2019-08-01 DIAGNOSIS — M545 Low back pain: Secondary | ICD-10-CM | POA: Diagnosis not present

## 2019-08-08 DIAGNOSIS — M19072 Primary osteoarthritis, left ankle and foot: Secondary | ICD-10-CM | POA: Diagnosis not present

## 2019-08-08 DIAGNOSIS — M79671 Pain in right foot: Secondary | ICD-10-CM | POA: Diagnosis not present

## 2019-08-08 DIAGNOSIS — M79672 Pain in left foot: Secondary | ICD-10-CM | POA: Diagnosis not present

## 2019-08-08 DIAGNOSIS — M19071 Primary osteoarthritis, right ankle and foot: Secondary | ICD-10-CM | POA: Diagnosis not present

## 2019-09-03 DIAGNOSIS — H353221 Exudative age-related macular degeneration, left eye, with active choroidal neovascularization: Secondary | ICD-10-CM | POA: Diagnosis not present

## 2019-09-16 DIAGNOSIS — H353221 Exudative age-related macular degeneration, left eye, with active choroidal neovascularization: Secondary | ICD-10-CM | POA: Diagnosis not present

## 2019-10-08 ENCOUNTER — Ambulatory Visit
Admission: RE | Admit: 2019-10-08 | Discharge: 2019-10-08 | Disposition: A | Discharge status: Home / Self Care | Payer: Medicare HMO | Source: Ambulatory Visit | Attending: Internal Medicine | Admitting: Internal Medicine

## 2019-10-08 DIAGNOSIS — R0602 Shortness of breath: Secondary | ICD-10-CM | POA: Diagnosis not present

## 2019-10-08 DIAGNOSIS — Z1231 Encounter for screening mammogram for malignant neoplasm of breast: Secondary | ICD-10-CM | POA: Insufficient documentation

## 2019-10-08 DIAGNOSIS — R001 Bradycardia, unspecified: Secondary | ICD-10-CM | POA: Diagnosis not present

## 2019-10-08 DIAGNOSIS — M1990 Unspecified osteoarthritis, unspecified site: Secondary | ICD-10-CM | POA: Diagnosis not present

## 2019-10-08 DIAGNOSIS — M069 Rheumatoid arthritis, unspecified: Secondary | ICD-10-CM | POA: Diagnosis not present

## 2019-10-08 DIAGNOSIS — I208 Other forms of angina pectoris: Secondary | ICD-10-CM | POA: Diagnosis not present

## 2019-10-08 DIAGNOSIS — J45909 Unspecified asthma, uncomplicated: Secondary | ICD-10-CM | POA: Diagnosis not present

## 2019-10-08 DIAGNOSIS — M436 Torticollis: Secondary | ICD-10-CM | POA: Diagnosis not present

## 2019-10-08 DIAGNOSIS — E782 Mixed hyperlipidemia: Secondary | ICD-10-CM | POA: Diagnosis not present

## 2019-10-08 DIAGNOSIS — I10 Essential (primary) hypertension: Secondary | ICD-10-CM | POA: Diagnosis not present

## 2019-10-08 DIAGNOSIS — R002 Palpitations: Secondary | ICD-10-CM | POA: Diagnosis not present

## 2019-10-15 DIAGNOSIS — E7800 Pure hypercholesterolemia, unspecified: Secondary | ICD-10-CM | POA: Diagnosis not present

## 2019-10-15 DIAGNOSIS — Z0000 Encounter for general adult medical examination without abnormal findings: Secondary | ICD-10-CM | POA: Diagnosis not present

## 2019-10-15 DIAGNOSIS — I10 Essential (primary) hypertension: Secondary | ICD-10-CM | POA: Diagnosis not present

## 2019-10-15 DIAGNOSIS — Z79899 Other long term (current) drug therapy: Secondary | ICD-10-CM | POA: Diagnosis not present

## 2019-10-15 DIAGNOSIS — D472 Monoclonal gammopathy: Secondary | ICD-10-CM | POA: Diagnosis not present

## 2019-10-15 DIAGNOSIS — I2510 Atherosclerotic heart disease of native coronary artery without angina pectoris: Secondary | ICD-10-CM | POA: Diagnosis not present

## 2019-10-15 DIAGNOSIS — J45998 Other asthma: Secondary | ICD-10-CM | POA: Diagnosis not present

## 2019-10-21 DIAGNOSIS — H353221 Exudative age-related macular degeneration, left eye, with active choroidal neovascularization: Secondary | ICD-10-CM | POA: Diagnosis not present

## 2019-11-07 DIAGNOSIS — D3131 Benign neoplasm of right choroid: Secondary | ICD-10-CM | POA: Diagnosis not present

## 2019-11-25 DIAGNOSIS — H353221 Exudative age-related macular degeneration, left eye, with active choroidal neovascularization: Secondary | ICD-10-CM | POA: Diagnosis not present

## 2020-01-03 ENCOUNTER — Other Ambulatory Visit: Payer: Self-pay

## 2020-01-03 DIAGNOSIS — D472 Monoclonal gammopathy: Secondary | ICD-10-CM

## 2020-01-06 ENCOUNTER — Inpatient Hospital Stay: Payer: Medicare HMO | Attending: Oncology

## 2020-01-06 ENCOUNTER — Other Ambulatory Visit: Payer: Self-pay

## 2020-01-06 DIAGNOSIS — D472 Monoclonal gammopathy: Secondary | ICD-10-CM | POA: Diagnosis not present

## 2020-01-06 DIAGNOSIS — Z79899 Other long term (current) drug therapy: Secondary | ICD-10-CM | POA: Diagnosis not present

## 2020-01-06 DIAGNOSIS — G629 Polyneuropathy, unspecified: Secondary | ICD-10-CM | POA: Diagnosis not present

## 2020-01-06 LAB — COMPREHENSIVE METABOLIC PANEL
ALT: 27 U/L (ref 0–44)
AST: 28 U/L (ref 15–41)
Albumin: 4.5 g/dL (ref 3.5–5.0)
Alkaline Phosphatase: 68 U/L (ref 38–126)
Anion gap: 8 (ref 5–15)
BUN: 14 mg/dL (ref 8–23)
CO2: 27 mmol/L (ref 22–32)
Calcium: 8.7 mg/dL — ABNORMAL LOW (ref 8.9–10.3)
Chloride: 102 mmol/L (ref 98–111)
Creatinine, Ser: 0.55 mg/dL (ref 0.44–1.00)
GFR calc Af Amer: >60 mL/min (ref >60)
GFR calc non Af Amer: >60 mL/min (ref >60)
Glucose, Bld: 97 mg/dL (ref 70–99)
Potassium: 3.9 mmol/L (ref 3.5–5.1)
Sodium: 137 mmol/L (ref 135–145)
Total Bilirubin: 0.8 mg/dL (ref 0.3–1.2)
Total Protein: 6.9 g/dL (ref 6.5–8.1)

## 2020-01-06 LAB — CBC WITH DIFFERENTIAL/PLATELET
Abs Immature Granulocytes: 0.01 10*3/uL (ref 0.00–0.07)
Basophils Absolute: 0 10*3/uL (ref 0.0–0.1)
Basophils Relative: 1 %
Eosinophils Absolute: 0.2 10*3/uL (ref 0.0–0.5)
Eosinophils Relative: 3 %
HCT: 38.5 % (ref 36.0–46.0)
Hemoglobin: 12.1 g/dL (ref 12.0–15.0)
Immature Granulocytes: 0 %
Lymphocytes Relative: 29 %
Lymphs Abs: 1.6 10*3/uL (ref 0.7–4.0)
MCH: 30.3 pg (ref 26.0–34.0)
MCHC: 31.4 g/dL (ref 30.0–36.0)
MCV: 96.3 fL (ref 80.0–100.0)
Monocytes Absolute: 0.5 10*3/uL (ref 0.1–1.0)
Monocytes Relative: 9 %
Neutro Abs: 3 10*3/uL (ref 1.7–7.7)
Neutrophils Relative %: 58 %
Platelets: 274 10*3/uL (ref 150–400)
RBC: 4 MIL/uL (ref 3.87–5.11)
RDW: 13 % (ref 11.5–15.5)
WBC: 5.3 10*3/uL (ref 4.0–10.5)
nRBC: 0 % (ref 0.0–0.2)

## 2020-01-07 DIAGNOSIS — H353221 Exudative age-related macular degeneration, left eye, with active choroidal neovascularization: Secondary | ICD-10-CM | POA: Diagnosis not present

## 2020-01-07 LAB — KAPPA/LAMBDA LIGHT CHAINS
Kappa free light chain: 41.8 mg/L — ABNORMAL HIGH (ref 3.3–19.4)
Kappa, lambda light chain ratio: 4.06 — ABNORMAL HIGH (ref 0.26–1.65)
Lambda free light chains: 10.3 mg/L (ref 5.7–26.3)

## 2020-01-08 LAB — MULTIPLE MYELOMA PANEL, SERUM
Albumin SerPl Elph-Mcnc: 3.9 g/dL (ref 2.9–4.4)
Albumin/Glob SerPl: 1.7 (ref 0.7–1.7)
Alpha 1: 0.2 g/dL (ref 0.0–0.4)
Alpha2 Glob SerPl Elph-Mcnc: 0.6 g/dL (ref 0.4–1.0)
B-Globulin SerPl Elph-Mcnc: 0.8 g/dL (ref 0.7–1.3)
Gamma Glob SerPl Elph-Mcnc: 0.7 g/dL (ref 0.4–1.8)
Globulin, Total: 2.4 g/dL (ref 2.2–3.9)
IgA: 109 mg/dL (ref 64–422)
IgG (Immunoglobin G), Serum: 687 mg/dL (ref 586–1602)
IgM (Immunoglobulin M), Srm: 49 mg/dL (ref 26–217)
M Protein SerPl Elph-Mcnc: 0.2 g/dL — ABNORMAL HIGH
Total Protein ELP: 6.3 g/dL (ref 6.0–8.5)

## 2020-01-13 ENCOUNTER — Encounter: Payer: Self-pay | Admitting: Oncology

## 2020-01-13 ENCOUNTER — Inpatient Hospital Stay: Payer: Medicare HMO | Admitting: Oncology

## 2020-01-13 ENCOUNTER — Other Ambulatory Visit: Payer: Self-pay

## 2020-01-13 VITALS — BP 144/65 | HR 52 | Temp 96.0°F | Resp 18 | Wt 111.6 lb

## 2020-01-13 DIAGNOSIS — D472 Monoclonal gammopathy: Secondary | ICD-10-CM | POA: Diagnosis not present

## 2020-01-13 DIAGNOSIS — G629 Polyneuropathy, unspecified: Secondary | ICD-10-CM | POA: Diagnosis not present

## 2020-01-13 DIAGNOSIS — Z79899 Other long term (current) drug therapy: Secondary | ICD-10-CM | POA: Diagnosis not present

## 2020-01-13 NOTE — Progress Notes (Signed)
Patient is here for follow up, she is doing well no complaints.  

## 2020-01-13 NOTE — Progress Notes (Signed)
Hematology/Oncology Consult note [facility] Telephone:([phone] [phone]   Patient Care Team: Idelle Crouch, MD as PCP - General (Internal Medicine)  REFERRING PROVIDER: Idelle Crouch, MD  CHIEF COMPLAINTS/REASON FOR VISIT:  Follow up for MGUS  HISTORY OF PRESENTING ILLNESS:  Mary Thornton is a 79 y.o. female who was seen in consultation at the request of Idelle Crouch, MD for evaluation of abnormal SPEP results.   Patient recently had work up done at Neurologist's office for lower extremity neuropathy.. Labs reviewed,  03/18/2019 SPEP showed 0.2 M spike, repeat SPEP on 06/27/2019 showed M protein of 0.2. IFE showed IgG Kappa monoclonal protein.  No aggravating or alleviated factors.  Associated signs or symptoms:  Neuropathy: Cool sensation in her face since around June 2019.  Symptoms have been gradually worsening.  She is not diabetic.  Also numbness in her bilateral feet since around October 2019 after lumbar decompression surgery.  Symptoms are worse when she lays down. Denies weight loss, fever chills, night sweating   Bone pain: Chronic back pain.  INTERVAL HISTORY Mary Thornton is a 78 y.o. female who has above history reviewed by me today presents for follow up visit for management of MGUS Problems and complaints are listed below: Patient has no new complaints today. She denies any new bone pain. Chronic lower extremity numbness and gait changes due to radiculopathy and back surgery, not changed.   Review of Systems  Constitutional: Negative for appetite change, chills, fatigue and fever.  HENT:   Negative for hearing loss and voice change.   Eyes: Negative for eye problems.  Respiratory: Negative for chest tightness and cough.   Cardiovascular: Negative for chest pain.  Gastrointestinal: Negative for abdominal distention, abdominal pain and blood in stool.  Endocrine: Negative for hot flashes.  Genitourinary:  Negative for difficulty urinating and frequency.   Musculoskeletal: Positive for back pain. Negative for arthralgias.  Skin: Negative for itching and rash.  Neurological: Positive for numbness. Negative for extremity weakness.  Hematological: Negative for adenopathy.  Psychiatric/Behavioral: Negative for confusion.     MEDICAL HISTORY:  Past Medical History:  Diagnosis Date  . Arthritis   . Asthma   . Barrett esophagus   . Complication of anesthesia    very hard to wake up. feels like she had too much medicine. also severe vomitting  . Coronary artery disease   . Diverticulosis   . Dysrhythmia    psvt  . Environmental allergies   . Fibrocystic breast disease   . GERD (gastroesophageal reflux disease)   . Glaucoma   . Hyperlipemia   . Hyperlipidemia   . Hypertension   . Macular degeneration   . 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 PONV (postoperative nausea and vomiting)   . PSVT (paroxysmal supraventricular tachycardia) ([place])   . Reactive airway disease   . Recurrent sinusitis   . Reflux esophagitis   . Seasonal allergies   . Torticollis   . Torticollis     SURGICAL HISTORY: Past Surgical History:  Procedure Laterality Date  . ABDOMINAL HYSTERECTOMY    . BREAST BIOPSY Left 1980   neg  . CATARACT EXTRACTION Right 03/13/2018   Dr. Herbert Deaner  . COLONOSCOPY WITH PROPOFOL N/A 06/23/2015   Procedure: COLONOSCOPY WITH PROPOFOL;  Surgeon: [name], MD;  Location: [facility] ENDOSCOPY;  Service: Endoscopy;  Laterality: N/A;  . ESOPHAGOGASTRODUODENOSCOPY (EGD) WITH PROPOFOL N/A 06/23/2015   Procedure: ESOPHAGOGASTRODUODENOSCOPY (EGD) WITH  PROPOFOL;  Surgeon: [name], MD;  Location: [facility] ENDOSCOPY;  Service: Endoscopy;  Laterality: N/A;  . ESOPHAGOGASTRODUODENOSCOPY (EGD) WITH PROPOFOL N/A 11/28/2017   Procedure: ESOPHAGOGASTRODUODENOSCOPY (EGD) WITH PROPOFOL;  Surgeon: [name], MD;  Location: [facility] ENDOSCOPY;  Service: Endoscopy;   Laterality: N/A;  . ESOPHAGOGASTRODUODENOSCOPY (EGD) WITH PROPOFOL N/A 01/18/2018   Procedure: ESOPHAGOGASTRODUODENOSCOPY (EGD) WITH PROPOFOL;  Surgeon: [name], MD;  Location: [facility] ENDOSCOPY;  Service: Endoscopy;  Laterality: N/A;  . JOINT REPLACEMENT Left 2009   total knee replacement  . KNEE ARTHROSCOPY    . LUMBAR LAMINECTOMY/DECOMPRESSION MICRODISCECTOMY N/A 08/22/2018   Procedure: LUMBAR LAMINECTOMY/DECOMPRESSION MICRODISCECTOMY 1 LEVEL-L2-5;  Surgeon: Meade Maw, MD;  Location: [facility] ORS;  Service: Neurosurgery;  Laterality: N/A;  . Torticollis surgery x 2      SOCIAL HISTORY: Social History   Socioeconomic History  . Marital status: Widowed    Spouse name: Not on file  . Number of children: Not on file  . Years of education: Not on file  . Highest education level: Not on file  Occupational History  . Occupation: hosiery mill    Comment: retired  Tobacco Use  . Smoking status: Never Smoker  . Smokeless tobacco: Never Used  Substance and Sexual Activity  . Alcohol use: Never  . Drug use: Never  . Sexual activity: Not Currently    Birth control/protection: Surgical  Other Topics Concern  . Not on file  Social History Narrative  . Not on file   Social Determinants of Health   Financial Resource Strain:   . Difficulty of Paying Living Expenses:   Food Insecurity:   . Worried About Charity fundraiser in the Last Year:   . Arboriculturist in the Last Year:   Transportation Needs:   . Film/video editor (Medical):   Marland Kitchen Lack of Transportation (Non-Medical):   Physical Activity:   . Days of Exercise per Week:   . Minutes of Exercise per Session:   Stress:   . Feeling of Stress :   Social Connections:   . Frequency of Communication with Friends and Family:   . Frequency of Social Gatherings with Friends and Family:   . Attends Religious Services:   . Active Member of Clubs or Organizations:   . Attends Archivist Meetings:   Marland Kitchen  Marital Status:   Intimate Partner Violence:   . Fear of Current or Ex-Partner:   . Emotionally Abused:   Marland Kitchen Physically Abused:   . Sexually Abused:     FAMILY HISTORY: Family History  Problem Relation Age of Onset  . Heart disease Mother   . Heart disease Sister   . Cancer Neg Hx   . Diabetes Neg Hx   . Breast cancer Neg Hx   . Amblyopia Neg Hx   . Blindness Neg Hx   . Cataracts Neg Hx   . Glaucoma Neg Hx   . Macular degeneration Neg Hx   . Retinal detachment Neg Hx   . Strabismus Neg Hx   . Retinitis pigmentosa Neg Hx     ALLERGIES:  is allergic to ibuprofen; chlorhexidine; penicillins; and sulfa antibiotics.  MEDICATIONS:  Current Outpatient Medications  Medication Sig Dispense Refill  . acetaminophen (TYLENOL) 325 MG tablet Take 650 mg by mouth every 6 (six) hours as needed.    Marland Kitchen aspirin EC 81 MG tablet Take 81 mg by mouth daily.    Marland Kitchen atorvastatin (LIPITOR) 40 MG tablet Take 1 tablet (40  mg total) by mouth daily. 30 tablet 0  . calcium-vitamin D (OSCAL WITH D) 500-200 MG-UNIT tablet Take 1 tablet by mouth daily.    Marland Kitchen CARTIA XT 180 MG 24 hr capsule Take 180 mg by mouth 2 (two) times daily.    . diclofenac (VOLTAREN) 75 MG EC tablet     . loratadine (CLARITIN) 10 MG tablet Take 1 tablet (10 mg total) by mouth daily. (Patient taking differently: Take 10 mg by mouth daily as needed for allergies. ) 30 tablet 0  . metoprolol tartrate (LOPRESSOR) 25 MG tablet Take 0.5 tablets (12.5 mg total) by mouth 2 (two) times daily. (Patient taking differently: Take 25 mg by mouth 2 (two) times daily. ) 30 tablet 0  . montelukast (SINGULAIR) 10 MG tablet Take 10 mg by mouth daily.     . Multiple Vitamin (MULTIVITAMIN) tablet Take 1 tablet by mouth daily at 3 pm.     . Multiple Vitamins-Minerals (PRESERVISION AREDS PO) Take 2 capsules by mouth 2 (two) times daily. Take one capsule in the morning and one in the evening    . pantoprazole (PROTONIX) 40 MG tablet Take 40 mg by mouth daily  before breakfast.     . timolol (TIMOPTIC) 0.25 % ophthalmic solution Place 1 drop into both eyes daily.    Marland Kitchen tiZANidine (ZANAFLEX) 4 MG tablet Take by mouth.     No current facility-administered medications for this visit.     PHYSICAL EXAMINATION: ECOG PERFORMANCE STATUS: 1 - Symptomatic but completely ambulatory Vitals:   01/13/20 1021  BP: (!) 144/65  Pulse: (!) 52  Resp: 18  Temp: (!) 96 F (35.6 C)  SpO2: 97%   Filed Weights   01/13/20 1021  Weight: 111 lb 9.6 oz (50.6 kg)    Physical Exam Constitutional:      General: She is not in acute distress. HENT:     Head: Normocephalic and atraumatic.  Eyes:     General: No scleral icterus. Cardiovascular:     Rate and Rhythm: Normal rate and regular rhythm.     Heart sounds: Normal heart sounds.  Pulmonary:     Effort: Pulmonary effort is normal. No respiratory distress.     Breath sounds: No wheezing.  Abdominal:     General: Bowel sounds are normal. There is no distension.     Palpations: Abdomen is soft.  Musculoskeletal:        General: No deformity. Normal range of motion.     Cervical back: Normal range of motion and neck supple.  Skin:    General: Skin is warm and dry.     Findings: No erythema or rash.  Neurological:     Mental Status: She is alert and oriented to person, place, and time. Mental status is at baseline.     Cranial Nerves: No cranial nerve deficit.  Psychiatric:        Mood and Affect: Mood normal.      LABORATORY DATA:  I have reviewed the data as listed Lab Results  Component Value Date   WBC 5.3 01/06/2020   HGB 12.1 01/06/2020   HCT 38.5 01/06/2020   MCV 96.3 01/06/2020   PLT 274 01/06/2020   Recent Labs    01/06/20 1055  NA 137  K 3.9  CL 102  CO2 27  GLUCOSE 97  BUN 14  CREATININE 0.55  CALCIUM 8.7*  GFRNONAA >60  GFRAA >60  PROT 6.9  ALBUMIN 4.5  AST 28  ALT 27  ALKPHOS 68  BILITOT 0.8   Iron/TIBC/Ferritin/ %Sat No results found for: IRON, TIBC,  FERRITIN, IRONPCTSAT    RADIOGRAPHIC STUDIES: I have personally reviewed the radiological images as listed and agreed with the findings in the report.  No results found.   ASSESSMENT & PLAN:  1. MGUS (monoclonal gammopathy of unknown significance)    Labs reviewed and discussed with patient.  IgG MGUS, M protein remained stable at 0.2.  Light chain ratio slightly increased. Patient has normal calcium level, normal kidney function.  Continue monitor multiple myeloma panel. 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  She does not have neuropathy as well.  Continue monitor.  I will check amyloidosis work-up at the next visit.   Orders Placed This Encounter  Procedures  . CBC with Differential/Platelet    Standing Status:   Future    Standing Expiration Date:   07/15/2021  . Comprehensive metabolic panel    Standing Status:   Future    Standing Expiration Date:   07/15/2021  . Multiple Myeloma Panel (SPEP&IFE w/QIG)    Standing Status:   Future    Standing Expiration Date:   07/15/2021  . Kappa/lambda light chains    Standing Status:   Future    Standing Expiration Date:   07/15/2021    All questions were answered. The patient knows to call the clinic with any problems questions or concerns.  Cc Idelle Crouch, MD  Return of visit:  6 months.   Earlie Server, MD, PhD 01/13/2020

## 2020-01-17 DIAGNOSIS — Z0000 Encounter for general adult medical examination without abnormal findings: Secondary | ICD-10-CM | POA: Diagnosis not present

## 2020-01-17 DIAGNOSIS — I471 Supraventricular tachycardia: Secondary | ICD-10-CM | POA: Diagnosis not present

## 2020-01-17 DIAGNOSIS — E785 Hyperlipidemia, unspecified: Secondary | ICD-10-CM | POA: Diagnosis not present

## 2020-01-17 DIAGNOSIS — I10 Essential (primary) hypertension: Secondary | ICD-10-CM | POA: Diagnosis not present

## 2020-01-17 DIAGNOSIS — D472 Monoclonal gammopathy: Secondary | ICD-10-CM | POA: Diagnosis not present

## 2020-01-17 DIAGNOSIS — Z79899 Other long term (current) drug therapy: Secondary | ICD-10-CM | POA: Diagnosis not present

## 2020-02-25 DIAGNOSIS — H353221 Exudative age-related macular degeneration, left eye, with active choroidal neovascularization: Secondary | ICD-10-CM | POA: Diagnosis not present

## 2020-04-06 DIAGNOSIS — H353221 Exudative age-related macular degeneration, left eye, with active choroidal neovascularization: Secondary | ICD-10-CM | POA: Diagnosis not present

## 2020-04-13 DIAGNOSIS — R001 Bradycardia, unspecified: Secondary | ICD-10-CM | POA: Diagnosis not present

## 2020-04-13 DIAGNOSIS — R002 Palpitations: Secondary | ICD-10-CM | POA: Diagnosis not present

## 2020-04-13 DIAGNOSIS — Z79899 Other long term (current) drug therapy: Secondary | ICD-10-CM | POA: Diagnosis not present

## 2020-04-13 DIAGNOSIS — I471 Supraventricular tachycardia: Secondary | ICD-10-CM | POA: Diagnosis not present

## 2020-04-13 DIAGNOSIS — E7800 Pure hypercholesterolemia, unspecified: Secondary | ICD-10-CM | POA: Diagnosis not present

## 2020-04-13 DIAGNOSIS — M436 Torticollis: Secondary | ICD-10-CM | POA: Diagnosis not present

## 2020-04-13 DIAGNOSIS — I208 Other forms of angina pectoris: Secondary | ICD-10-CM | POA: Diagnosis not present

## 2020-04-13 DIAGNOSIS — E782 Mixed hyperlipidemia: Secondary | ICD-10-CM | POA: Diagnosis not present

## 2020-04-13 DIAGNOSIS — I10 Essential (primary) hypertension: Secondary | ICD-10-CM | POA: Diagnosis not present

## 2020-04-13 DIAGNOSIS — R0602 Shortness of breath: Secondary | ICD-10-CM | POA: Diagnosis not present

## 2020-04-16 DIAGNOSIS — H01003 Unspecified blepharitis right eye, unspecified eyelid: Secondary | ICD-10-CM | POA: Diagnosis not present

## 2020-04-28 DIAGNOSIS — I10 Essential (primary) hypertension: Secondary | ICD-10-CM | POA: Diagnosis not present

## 2020-04-28 DIAGNOSIS — Z79899 Other long term (current) drug therapy: Secondary | ICD-10-CM | POA: Diagnosis not present

## 2020-04-28 DIAGNOSIS — I2510 Atherosclerotic heart disease of native coronary artery without angina pectoris: Secondary | ICD-10-CM | POA: Diagnosis not present

## 2020-04-28 DIAGNOSIS — K219 Gastro-esophageal reflux disease without esophagitis: Secondary | ICD-10-CM | POA: Diagnosis not present

## 2020-04-28 DIAGNOSIS — E7800 Pure hypercholesterolemia, unspecified: Secondary | ICD-10-CM | POA: Diagnosis not present

## 2020-05-06 DIAGNOSIS — H401334 Pigmentary glaucoma, bilateral, indeterminate stage: Secondary | ICD-10-CM | POA: Diagnosis not present

## 2020-05-13 DIAGNOSIS — H401334 Pigmentary glaucoma, bilateral, indeterminate stage: Secondary | ICD-10-CM | POA: Diagnosis not present

## 2020-05-18 DIAGNOSIS — H353221 Exudative age-related macular degeneration, left eye, with active choroidal neovascularization: Secondary | ICD-10-CM | POA: Diagnosis not present

## 2020-06-26 DIAGNOSIS — Z0100 Encounter for examination of eyes and vision without abnormal findings: Secondary | ICD-10-CM | POA: Diagnosis not present

## 2020-06-30 DIAGNOSIS — H353221 Exudative age-related macular degeneration, left eye, with active choroidal neovascularization: Secondary | ICD-10-CM | POA: Diagnosis not present

## 2020-07-03 DIAGNOSIS — R1013 Epigastric pain: Secondary | ICD-10-CM | POA: Diagnosis not present

## 2020-07-03 DIAGNOSIS — K2270 Barrett's esophagus without dysplasia: Secondary | ICD-10-CM | POA: Diagnosis not present

## 2020-07-03 DIAGNOSIS — Z1211 Encounter for screening for malignant neoplasm of colon: Secondary | ICD-10-CM | POA: Diagnosis not present

## 2020-07-08 ENCOUNTER — Other Ambulatory Visit: Payer: Self-pay

## 2020-07-08 ENCOUNTER — Inpatient Hospital Stay: Payer: Medicare HMO | Attending: Oncology

## 2020-07-08 DIAGNOSIS — D472 Monoclonal gammopathy: Secondary | ICD-10-CM | POA: Diagnosis not present

## 2020-07-08 DIAGNOSIS — I2510 Atherosclerotic heart disease of native coronary artery without angina pectoris: Secondary | ICD-10-CM | POA: Diagnosis not present

## 2020-07-08 DIAGNOSIS — I10 Essential (primary) hypertension: Secondary | ICD-10-CM | POA: Insufficient documentation

## 2020-07-08 DIAGNOSIS — G8929 Other chronic pain: Secondary | ICD-10-CM | POA: Diagnosis not present

## 2020-07-08 DIAGNOSIS — M549 Dorsalgia, unspecified: Secondary | ICD-10-CM | POA: Insufficient documentation

## 2020-07-08 LAB — CBC WITH DIFFERENTIAL/PLATELET
Abs Immature Granulocytes: 0.01 10*3/uL (ref 0.00–0.07)
Basophils Absolute: 0.1 10*3/uL (ref 0.0–0.1)
Basophils Relative: 1 %
Eosinophils Absolute: 0.3 10*3/uL (ref 0.0–0.5)
Eosinophils Relative: 4 %
HCT: 37.4 % (ref 36.0–46.0)
Hemoglobin: 12.5 g/dL (ref 12.0–15.0)
Immature Granulocytes: 0 %
Lymphocytes Relative: 25 %
Lymphs Abs: 1.6 10*3/uL (ref 0.7–4.0)
MCH: 30.9 pg (ref 26.0–34.0)
MCHC: 33.4 g/dL (ref 30.0–36.0)
MCV: 92.6 fL (ref 80.0–100.0)
Monocytes Absolute: 0.5 10*3/uL (ref 0.1–1.0)
Monocytes Relative: 8 %
Neutro Abs: 3.8 10*3/uL (ref 1.7–7.7)
Neutrophils Relative %: 62 %
Platelets: 259 10*3/uL (ref 150–400)
RBC: 4.04 MIL/uL (ref 3.87–5.11)
RDW: 13.4 % (ref 11.5–15.5)
WBC: 6.1 10*3/uL (ref 4.0–10.5)
nRBC: 0 % (ref 0.0–0.2)

## 2020-07-08 LAB — COMPREHENSIVE METABOLIC PANEL
ALT: 29 U/L (ref 0–44)
AST: 33 U/L (ref 15–41)
Albumin: 4.8 g/dL (ref 3.5–5.0)
Alkaline Phosphatase: 63 U/L (ref 38–126)
Anion gap: 9 (ref 5–15)
BUN: 19 mg/dL (ref 8–23)
CO2: 29 mmol/L (ref 22–32)
Calcium: 8.8 mg/dL — ABNORMAL LOW (ref 8.9–10.3)
Chloride: 103 mmol/L (ref 98–111)
Creatinine, Ser: 0.56 mg/dL (ref 0.44–1.00)
GFR calc Af Amer: >60 mL/min (ref >60)
GFR calc non Af Amer: >60 mL/min (ref >60)
Glucose, Bld: 94 mg/dL (ref 70–99)
Potassium: 4.1 mmol/L (ref 3.5–5.1)
Sodium: 141 mmol/L (ref 135–145)
Total Bilirubin: 0.8 mg/dL (ref 0.3–1.2)
Total Protein: 7.2 g/dL (ref 6.5–8.1)

## 2020-07-08 LAB — PROTEIN / CREATININE RATIO, URINE
Creatinine, Urine: 17 mg/dL
Total Protein, Urine: <6 mg/dL

## 2020-07-09 LAB — MISC LABCORP TEST (SEND OUT): Labcorp test code: 143000

## 2020-07-09 LAB — KAPPA/LAMBDA LIGHT CHAINS
Kappa free light chain: 56.2 mg/L — ABNORMAL HIGH (ref 3.3–19.4)
Kappa, lambda light chain ratio: 5.3 — ABNORMAL HIGH (ref 0.26–1.65)
Lambda free light chains: 10.6 mg/L (ref 5.7–26.3)

## 2020-07-10 LAB — MULTIPLE MYELOMA PANEL, SERUM
Albumin SerPl Elph-Mcnc: 4 g/dL (ref 2.9–4.4)
Albumin/Glob SerPl: 1.5 (ref 0.7–1.7)
Alpha 1: 0.3 g/dL (ref 0.0–0.4)
Alpha2 Glob SerPl Elph-Mcnc: 0.7 g/dL (ref 0.4–1.0)
B-Globulin SerPl Elph-Mcnc: 1 g/dL (ref 0.7–1.3)
Gamma Glob SerPl Elph-Mcnc: 0.8 g/dL (ref 0.4–1.8)
Globulin, Total: 2.7 g/dL (ref 2.2–3.9)
IgA: 118 mg/dL (ref 64–422)
IgG (Immunoglobin G), Serum: 662 mg/dL (ref 586–1602)
IgM (Immunoglobulin M), Srm: 47 mg/dL (ref 26–217)
M Protein SerPl Elph-Mcnc: 0.2 g/dL — ABNORMAL HIGH
Total Protein ELP: 6.7 g/dL (ref 6.0–8.5)

## 2020-07-15 ENCOUNTER — Encounter: Payer: Self-pay | Admitting: Oncology

## 2020-07-15 ENCOUNTER — Other Ambulatory Visit: Payer: Self-pay

## 2020-07-15 ENCOUNTER — Inpatient Hospital Stay: Payer: Medicare HMO | Admitting: Oncology

## 2020-07-15 VITALS — BP 138/68 | HR 50 | Temp 98.1°F | Resp 16 | Wt 110.1 lb

## 2020-07-15 DIAGNOSIS — G8929 Other chronic pain: Secondary | ICD-10-CM | POA: Diagnosis not present

## 2020-07-15 DIAGNOSIS — M549 Dorsalgia, unspecified: Secondary | ICD-10-CM | POA: Diagnosis not present

## 2020-07-15 DIAGNOSIS — I2510 Atherosclerotic heart disease of native coronary artery without angina pectoris: Secondary | ICD-10-CM | POA: Diagnosis not present

## 2020-07-15 DIAGNOSIS — I10 Essential (primary) hypertension: Secondary | ICD-10-CM | POA: Diagnosis not present

## 2020-07-15 DIAGNOSIS — D472 Monoclonal gammopathy: Secondary | ICD-10-CM

## 2020-07-15 NOTE — Progress Notes (Signed)
Patient denies new problems/concerns today.   °

## 2020-07-15 NOTE — Progress Notes (Signed)
Hematology/Oncology Consult note [facility] Telephone:([phone] [phone]   Patient Care Team: Idelle Crouch, MD as PCP - General (Internal Medicine)  REFERRING PROVIDER: Idelle Crouch, MD  CHIEF COMPLAINTS/REASON FOR VISIT:  Follow up for MGUS  HISTORY OF PRESENTING ILLNESS:  Mary Thornton is a 79 y.o. female who was seen in consultation at the request of Idelle Crouch, MD for evaluation of abnormal SPEP results.   Patient recently had work up done at Neurologist's office for lower extremity neuropathy.. Labs reviewed,  03/18/2019 SPEP showed 0.2 M spike, repeat SPEP on 06/27/2019 showed M protein of 0.2. IFE showed IgG Kappa monoclonal protein.  No aggravating or alleviated factors.  Associated signs or symptoms:  Neuropathy: Cool sensation in her face since around June 2019.  Symptoms have been gradually worsening.  She is not diabetic.  Also numbness in her bilateral feet since around October 2019 after lumbar decompression surgery.  Symptoms are worse when she lays down. Denies weight loss, fever chills, night sweating   Bone pain: Chronic back pain.  INTERVAL HISTORY Mary Thornton is a 79 y.o. female who has above history reviewed by me today presents for follow up visit for management of MGUS Problems and complaints are listed below: Patient has no new complaints today.   Denies any new bone pain.  Chronic back pain unchanged.  Review of Systems  Constitutional: Negative for appetite change, chills, fatigue and fever.  HENT:   Negative for hearing loss and voice change.   Eyes: Negative for eye problems.  Respiratory: Negative for chest tightness and cough.   Cardiovascular: Negative for chest pain.  Gastrointestinal: Negative for abdominal distention, abdominal pain and blood in stool.  Endocrine: Negative for hot flashes.  Genitourinary: Negative for difficulty urinating and frequency.   Musculoskeletal:  Negative for arthralgias and back pain.  Skin: Negative for itching and rash.  Neurological: Positive for numbness. Negative for extremity weakness.  Hematological: Negative for adenopathy.  Psychiatric/Behavioral: Negative for confusion.     MEDICAL HISTORY:  Past Medical History:  Diagnosis Date  . Arthritis   . Asthma   . Barrett esophagus   . Complication of anesthesia    very hard to wake up. feels like she had too much medicine. also severe vomitting  . Coronary artery disease   . Diverticulosis   . Dysrhythmia    psvt  . Environmental allergies   . Fibrocystic breast disease   . GERD (gastroesophageal reflux disease)   . Glaucoma   . Hyperlipemia   . Hyperlipidemia   . Hypertension   . Macular degeneration   . 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 PONV (postoperative nausea and vomiting)   . PSVT (paroxysmal supraventricular tachycardia) ([place])   . Reactive airway disease   . Recurrent sinusitis   . Reflux esophagitis   . Seasonal allergies   . Torticollis   . Torticollis     SURGICAL HISTORY: Past Surgical History:  Procedure Laterality Date  . ABDOMINAL HYSTERECTOMY    . BREAST BIOPSY Left 1980   neg  . CATARACT EXTRACTION Right 03/13/2018   Dr. Herbert Deaner  . COLONOSCOPY WITH PROPOFOL N/A 06/23/2015   Procedure: COLONOSCOPY WITH PROPOFOL;  Surgeon: [name], MD;  Location: [facility] ENDOSCOPY;  Service: Endoscopy;  Laterality: N/A;  . ESOPHAGOGASTRODUODENOSCOPY (EGD) WITH PROPOFOL N/A 06/23/2015   Procedure: ESOPHAGOGASTRODUODENOSCOPY (EGD) WITH PROPOFOL;  Surgeon: [name], MD;  Location: [facility] ENDOSCOPY;  Service: Endoscopy;  Laterality: N/A;  . ESOPHAGOGASTRODUODENOSCOPY (EGD) WITH PROPOFOL N/A 11/28/2017   Procedure: ESOPHAGOGASTRODUODENOSCOPY (EGD) WITH PROPOFOL;  Surgeon: [name], MD;  Location: [facility] ENDOSCOPY;  Service: Endoscopy;  Laterality: N/A;  . ESOPHAGOGASTRODUODENOSCOPY (EGD) WITH PROPOFOL N/A  01/18/2018   Procedure: ESOPHAGOGASTRODUODENOSCOPY (EGD) WITH PROPOFOL;  Surgeon: [name], MD;  Location: [facility] ENDOSCOPY;  Service: Endoscopy;  Laterality: N/A;  . JOINT REPLACEMENT Left 2009   total knee replacement  . KNEE ARTHROSCOPY    . LUMBAR LAMINECTOMY/DECOMPRESSION MICRODISCECTOMY N/A 08/22/2018   Procedure: LUMBAR LAMINECTOMY/DECOMPRESSION MICRODISCECTOMY 1 LEVEL-L2-5;  Surgeon: Meade Maw, MD;  Location: [facility] ORS;  Service: Neurosurgery;  Laterality: N/A;  . Torticollis surgery x 2      SOCIAL HISTORY: Social History   Socioeconomic History  . Marital status: Widowed    Spouse name: Not on file  . Number of children: Not on file  . Years of education: Not on file  . Highest education level: Not on file  Occupational History  . Occupation: hosiery mill    Comment: retired  Tobacco Use  . Smoking status: Never Smoker  . Smokeless tobacco: Never Used  Vaping Use  . Vaping Use: Never used  Substance and Sexual Activity  . Alcohol use: Never  . Drug use: Never  . Sexual activity: Not Currently    Birth control/protection: Surgical  Other Topics Concern  . Not on file  Social History Narrative  . Not on file   Social Determinants of Health   Financial Resource Strain:   . Difficulty of Paying Living Expenses: Not on file  Food Insecurity:   . Worried About Charity fundraiser in the Last Year: Not on file  . Ran Out of Food in the Last Year: Not on file  Transportation Needs:   . Lack of Transportation (Medical): Not on file  . Lack of Transportation (Non-Medical): Not on file  Physical Activity:   . Days of Exercise per Week: Not on file  . Minutes of Exercise per Session: Not on file  Stress:   . Feeling of Stress : Not on file  Social Connections:   . Frequency of Communication with Friends and Family: Not on file  . Frequency of Social Gatherings with Friends and Family: Not on file  . Attends Religious Services: Not on file  . Active  Member of Clubs or Organizations: Not on file  . Attends Archivist Meetings: Not on file  . Marital Status: Not on file  Intimate Partner Violence:   . Fear of Current or Ex-Partner: Not on file  . Emotionally Abused: Not on file  . Physically Abused: Not on file  . Sexually Abused: Not on file    FAMILY HISTORY: Family History  Problem Relation Age of Onset  . Heart disease Mother   . Heart disease Sister   . Cancer Neg Hx   . Diabetes Neg Hx   . Breast cancer Neg Hx   . Amblyopia Neg Hx   . Blindness Neg Hx   . Cataracts Neg Hx   . Glaucoma Neg Hx   . Macular degeneration Neg Hx   . Retinal detachment Neg Hx   . Strabismus Neg Hx   . Retinitis pigmentosa Neg Hx     ALLERGIES:  is allergic to ibuprofen, chlorhexidine, penicillins, and sulfa antibiotics.  MEDICATIONS:  Current Outpatient Medications  Medication Sig Dispense Refill  . acetaminophen (TYLENOL) 325 MG tablet Take 650 mg by mouth every  6 (six) hours as needed.    Marland Kitchen aspirin EC 81 MG tablet Take 81 mg by mouth daily.    Marland Kitchen atorvastatin (LIPITOR) 40 MG tablet Take 1 tablet (40 mg total) by mouth daily. 30 tablet 0  . calcium-vitamin D (OSCAL WITH D) 500-200 MG-UNIT tablet Take 1 tablet by mouth daily.    Marland Kitchen CARTIA XT 180 MG 24 hr capsule Take 180 mg by mouth 2 (two) times daily.    . diclofenac (VOLTAREN) 75 MG EC tablet     . loratadine (CLARITIN) 10 MG tablet Take 1 tablet (10 mg total) by mouth daily. (Patient taking differently: Take 10 mg by mouth daily as needed for allergies. ) 30 tablet 0  . metoprolol tartrate (LOPRESSOR) 25 MG tablet Take 0.5 tablets (12.5 mg total) by mouth 2 (two) times daily. (Patient taking differently: Take 25 mg by mouth 2 (two) times daily. ) 30 tablet 0  . montelukast (SINGULAIR) 10 MG tablet Take 10 mg by mouth daily.     . Multiple Vitamin (MULTIVITAMIN) tablet Take 1 tablet by mouth daily at 3 pm.     . Multiple Vitamins-Minerals (PRESERVISION AREDS PO) Take 2 capsules  by mouth 2 (two) times daily. Take one capsule in the morning and one in the evening    . pantoprazole (PROTONIX) 40 MG tablet Take 40 mg by mouth daily before breakfast.     . timolol (TIMOPTIC) 0.25 % ophthalmic solution Place 1 drop into both eyes daily.    Marland Kitchen tiZANidine (ZANAFLEX) 4 MG tablet Take by mouth.     No current facility-administered medications for this visit.     PHYSICAL EXAMINATION: ECOG PERFORMANCE STATUS: 1 - Symptomatic but completely ambulatory Vitals:   07/15/20 0955 07/15/20 1015  BP: 138/68   Pulse: (!) 46 (!) 50  Resp: 16   Temp: 98.1 F (36.7 C)    Filed Weights   07/15/20 0955  Weight: 110 lb 1.6 oz (49.9 kg)    Physical Exam Constitutional:      General: She is not in acute distress. HENT:     Head: Normocephalic and atraumatic.  Eyes:     General: No scleral icterus. Cardiovascular:     Rate and Rhythm: Normal rate and regular rhythm.     Heart sounds: Normal heart sounds.  Pulmonary:     Effort: Pulmonary effort is normal. No respiratory distress.     Breath sounds: No wheezing.  Abdominal:     General: Bowel sounds are normal. There is no distension.     Palpations: Abdomen is soft.  Musculoskeletal:        General: No deformity. Normal range of motion.     Cervical back: Normal range of motion and neck supple.  Skin:    General: Skin is warm and dry.     Findings: No erythema or rash.  Neurological:     Mental Status: She is alert and oriented to person, place, and time. Mental status is at baseline.     Cranial Nerves: No cranial nerve deficit.  Psychiatric:        Mood and Affect: Mood normal.      LABORATORY DATA:  I have reviewed the data as listed Lab Results  Component Value Date   WBC 6.1 07/08/2020   HGB 12.5 07/08/2020   HCT 37.4 07/08/2020   MCV 92.6 07/08/2020   PLT 259 07/08/2020   Recent Labs    01/06/20 1055 07/08/20 1056  NA 137 141  K 3.9 4.1  CL 102 103  CO2 27 29  GLUCOSE 97 94  BUN 14 19    CREATININE 0.55 0.56  CALCIUM 8.7* 8.8*  GFRNONAA >60 >60  GFRAA >60 >60  PROT 6.9 7.2  ALBUMIN 4.5 4.8  AST 28 33  ALT 27 29  ALKPHOS 68 63  BILITOT 0.8 0.8   Iron/TIBC/Ferritin/ %Sat No results found for: IRON, TIBC, FERRITIN, IRONPCTSAT    RADIOGRAPHIC STUDIES: I have personally reviewed the radiological images as listed and agreed with the findings in the report.  No results found.   ASSESSMENT & PLAN:  1. MGUS (monoclonal gammopathy of unknown significance)    #Labs are reviewed and discussed with patient .  IgG MGUS M protein remained stable at 0.2.  Light chain ratio is slightly increased at 5. Patient has stable calcium level, normal kidney function. NT BNP is normal. I recommend continue observation. Given that her M protein has not changed for the past 1 year, and this is IgG subtype, I recommend to increase interval to 1 year. .   Orders Placed This Encounter  Procedures  . CBC with Differential/Platelet    Standing Status:   Future    Standing Expiration Date:   07/15/2021  . Comprehensive metabolic panel    Standing Status:   Future    Standing Expiration Date:   07/15/2021  . Multiple Myeloma Panel (SPEP&IFE w/QIG)    Standing Status:   Future    Standing Expiration Date:   07/15/2021  . Kappa/lambda light chains    Standing Status:   Future    Standing Expiration Date:   07/15/2021  . Protein / creatinine index, urine    Standing Status:   Future    Standing Expiration Date:   07/15/2021  . Miscellaneous [facility] test (send-out)    Standing Status:   Future    Standing Expiration Date:   07/15/2021    Order Specific Question:   Test name / description:    Answer:   NT-proBNP/test code 143000    All questions were answered. The patient knows to call the clinic with any problems questions or concerns.  Cc Idelle Crouch, MD  Return of visit:  12 months.   Earlie Server, MD, PhD 07/15/2020

## 2020-07-30 DIAGNOSIS — E7800 Pure hypercholesterolemia, unspecified: Secondary | ICD-10-CM | POA: Diagnosis not present

## 2020-07-30 DIAGNOSIS — I10 Essential (primary) hypertension: Secondary | ICD-10-CM | POA: Diagnosis not present

## 2020-07-30 DIAGNOSIS — Z1231 Encounter for screening mammogram for malignant neoplasm of breast: Secondary | ICD-10-CM | POA: Diagnosis not present

## 2020-07-30 DIAGNOSIS — K2270 Barrett's esophagus without dysplasia: Secondary | ICD-10-CM | POA: Diagnosis not present

## 2020-07-30 DIAGNOSIS — Z79899 Other long term (current) drug therapy: Secondary | ICD-10-CM | POA: Diagnosis not present

## 2020-07-30 DIAGNOSIS — Z23 Encounter for immunization: Secondary | ICD-10-CM | POA: Diagnosis not present

## 2020-07-30 DIAGNOSIS — I2510 Atherosclerotic heart disease of native coronary artery without angina pectoris: Secondary | ICD-10-CM | POA: Diagnosis not present

## 2020-07-30 DIAGNOSIS — D472 Monoclonal gammopathy: Secondary | ICD-10-CM | POA: Diagnosis not present

## 2020-08-04 ENCOUNTER — Other Ambulatory Visit: Payer: Self-pay | Admitting: Internal Medicine

## 2020-08-04 DIAGNOSIS — Z1231 Encounter for screening mammogram for malignant neoplasm of breast: Secondary | ICD-10-CM

## 2020-08-04 DIAGNOSIS — H353221 Exudative age-related macular degeneration, left eye, with active choroidal neovascularization: Secondary | ICD-10-CM | POA: Diagnosis not present

## 2020-08-07 ENCOUNTER — Other Ambulatory Visit
Admission: RE | Admit: 2020-08-07 | Discharge: 2020-08-07 | Disposition: A | Discharge status: Home / Self Care | Payer: Medicare HMO | Source: Ambulatory Visit | Attending: Gastroenterology | Admitting: Gastroenterology

## 2020-08-07 ENCOUNTER — Other Ambulatory Visit: Payer: Self-pay

## 2020-08-07 DIAGNOSIS — Z20822 Contact with and (suspected) exposure to covid-19: Secondary | ICD-10-CM | POA: Diagnosis not present

## 2020-08-07 DIAGNOSIS — Z01818 Encounter for other preprocedural examination: Secondary | ICD-10-CM | POA: Insufficient documentation

## 2020-08-07 LAB — SARS CORONAVIRUS 2 (TAT 6-24 HRS): SARS Coronavirus 2: NEGATIVE

## 2020-08-10 ENCOUNTER — Encounter: Payer: Self-pay | Admitting: *Deleted

## 2020-08-11 ENCOUNTER — Ambulatory Visit: Payer: Medicare HMO | Admitting: Certified Registered Nurse Anesthetist

## 2020-08-11 ENCOUNTER — Encounter: Payer: Self-pay | Admitting: *Deleted

## 2020-08-11 ENCOUNTER — Encounter
Admission: RE | Disposition: A | Discharge status: Home / Self Care | Payer: Self-pay | Source: Home / Self Care | Attending: Gastroenterology

## 2020-08-11 ENCOUNTER — Other Ambulatory Visit: Payer: Self-pay

## 2020-08-11 ENCOUNTER — Ambulatory Visit
Admission: RE | Admit: 2020-08-11 | Discharge: 2020-08-11 | Disposition: A | Discharge status: Home / Self Care | Payer: Medicare HMO | Attending: Gastroenterology | Admitting: Gastroenterology

## 2020-08-11 DIAGNOSIS — Z880 Allergy status to penicillin: Secondary | ICD-10-CM | POA: Insufficient documentation

## 2020-08-11 DIAGNOSIS — Z882 Allergy status to sulfonamides status: Secondary | ICD-10-CM | POA: Diagnosis not present

## 2020-08-11 DIAGNOSIS — K6289 Other specified diseases of anus and rectum: Secondary | ICD-10-CM | POA: Insufficient documentation

## 2020-08-11 DIAGNOSIS — I2510 Atherosclerotic heart disease of native coronary artery without angina pectoris: Secondary | ICD-10-CM | POA: Diagnosis not present

## 2020-08-11 DIAGNOSIS — Z888 Allergy status to other drugs, medicaments and biological substances status: Secondary | ICD-10-CM | POA: Insufficient documentation

## 2020-08-11 DIAGNOSIS — I471 Supraventricular tachycardia: Secondary | ICD-10-CM | POA: Insufficient documentation

## 2020-08-11 DIAGNOSIS — K219 Gastro-esophageal reflux disease without esophagitis: Secondary | ICD-10-CM | POA: Insufficient documentation

## 2020-08-11 DIAGNOSIS — Z886 Allergy status to analgesic agent status: Secondary | ICD-10-CM | POA: Insufficient documentation

## 2020-08-11 DIAGNOSIS — K2270 Barrett's esophagus without dysplasia: Secondary | ICD-10-CM | POA: Diagnosis not present

## 2020-08-11 DIAGNOSIS — Z79899 Other long term (current) drug therapy: Secondary | ICD-10-CM | POA: Insufficient documentation

## 2020-08-11 DIAGNOSIS — Z7982 Long term (current) use of aspirin: Secondary | ICD-10-CM | POA: Diagnosis not present

## 2020-08-11 DIAGNOSIS — I10 Essential (primary) hypertension: Secondary | ICD-10-CM | POA: Insufficient documentation

## 2020-08-11 DIAGNOSIS — K2100 Gastro-esophageal reflux disease with esophagitis, without bleeding: Secondary | ICD-10-CM | POA: Diagnosis not present

## 2020-08-11 DIAGNOSIS — Z1211 Encounter for screening for malignant neoplasm of colon: Secondary | ICD-10-CM | POA: Diagnosis not present

## 2020-08-11 DIAGNOSIS — R1013 Epigastric pain: Secondary | ICD-10-CM | POA: Diagnosis not present

## 2020-08-11 DIAGNOSIS — J45909 Unspecified asthma, uncomplicated: Secondary | ICD-10-CM | POA: Diagnosis not present

## 2020-08-11 DIAGNOSIS — Q438 Other specified congenital malformations of intestine: Secondary | ICD-10-CM | POA: Insufficient documentation

## 2020-08-11 DIAGNOSIS — E785 Hyperlipidemia, unspecified: Secondary | ICD-10-CM | POA: Diagnosis not present

## 2020-08-11 HISTORY — PX: ESOPHAGOGASTRODUODENOSCOPY (EGD) WITH PROPOFOL: SHX5813

## 2020-08-11 HISTORY — PX: COLONOSCOPY WITH PROPOFOL: SHX5780

## 2020-08-11 SURGERY — COLONOSCOPY WITH PROPOFOL
Anesthesia: General

## 2020-08-11 MED ORDER — LIDOCAINE HCL (PF) 2 % IJ SOLN
INTRAMUSCULAR | Status: AC
  Filled 2020-08-11: qty 5

## 2020-08-11 MED ORDER — LIDOCAINE HCL (CARDIAC) PF 100 MG/5ML IV SOSY
PREFILLED_SYRINGE | INTRAVENOUS | Status: DC | PRN
  Administered 2020-08-11: 50 mg via INTRAVENOUS

## 2020-08-11 MED ORDER — SODIUM CHLORIDE 0.9 % IV SOLN: INTRAVENOUS | Status: DC

## 2020-08-11 MED ORDER — GLYCOPYRROLATE 0.2 MG/ML IJ SOLN
INTRAMUSCULAR | Status: AC
  Filled 2020-08-11: qty 1

## 2020-08-11 MED ORDER — PROPOFOL 500 MG/50ML IV EMUL
INTRAVENOUS | Status: DC | PRN
  Administered 2020-08-11: 150 ug/kg/min via INTRAVENOUS

## 2020-08-11 MED ORDER — PROPOFOL 500 MG/50ML IV EMUL
INTRAVENOUS | Status: AC
  Filled 2020-08-11: qty 50

## 2020-08-11 MED ORDER — EPHEDRINE SULFATE 50 MG/ML IJ SOLN
INTRAMUSCULAR | Status: DC | PRN
  Administered 2020-08-11: 10 mg via INTRAVENOUS
  Administered 2020-08-11 (×2): 5 mg via INTRAVENOUS

## 2020-08-11 MED ORDER — PROPOFOL 10 MG/ML IV BOLUS
INTRAVENOUS | Status: DC | PRN
  Administered 2020-08-11: 20 mg via INTRAVENOUS
  Administered 2020-08-11: 40 mg via INTRAVENOUS
  Administered 2020-08-11: 20 mg via INTRAVENOUS

## 2020-08-11 MED ORDER — ONDANSETRON HCL 4 MG/2ML IJ SOLN
INTRAMUSCULAR | Status: DC | PRN
  Administered 2020-08-11: 4 mg via INTRAVENOUS

## 2020-08-11 MED ORDER — ONDANSETRON HCL 4 MG/2ML IJ SOLN
INTRAMUSCULAR | Status: AC
  Filled 2020-08-11: qty 2

## 2020-08-11 MED ORDER — EPHEDRINE 5 MG/ML INJ
INTRAVENOUS | Status: AC
  Filled 2020-08-11: qty 10

## 2020-08-11 NOTE — Anesthesia Preprocedure Evaluation (Signed)
Anesthesia Evaluation  Patient identified by MRN, date of birth, ID band Patient awake    Reviewed: Allergy & Precautions, NPO status , Patient's Chart, lab work & pertinent test results, reviewed documented beta blocker date and time   History of Anesthesia Complications (+) PONV, PROLONGED EMERGENCE and history of anesthetic complications  Airway Mallampati: III  TM Distance: >3 FB Neck ROM: Full    Dental no notable dental hx. (+) Teeth Intact   Pulmonary asthma , neg sleep apnea, neg COPD, Patient abstained from smoking.Not current smoker,    Pulmonary exam normal breath sounds clear to auscultation       Cardiovascular Exercise Tolerance: Good METShypertension, Pt. on medications + CAD  (-) Past MI and (-) CHF + dysrhythmias Supra Ventricular Tachycardia (-) Valvular Problems/Murmurs Rhythm:Regular Rate:Normal - Systolic murmurs    Neuro/Psych neg Seizures negative neurological ROS  negative psych ROS   GI/Hepatic Neg liver ROS, GERD (Barrett's esophagous)  Medicated and Controlled,  Endo/Other  neg diabetes  Renal/GU negative Renal ROS     Musculoskeletal   Abdominal   Peds  Hematology   Anesthesia Other Findings Past Medical History: No date: Arthritis No date: Asthma No date: Barrett esophagus No date: Complication of anesthesia     Comment:  very hard to wake up. feels like she had too much               medicine. also severe vomitting No date: Coronary artery disease No date: Diverticulosis No date: Dysrhythmia     Comment:  psvt No date: Environmental allergies No date: Fibrocystic breast disease No date: GERD (gastroesophageal reflux disease) No date: Glaucoma No date: Hyperlipemia No date: Hyperlipidemia No date: Hypertension No date: Macular degeneration No date: Macular degeneration 07/2018: Neuropathy of foot     Comment:  right foot d/t bulging discs No date: PONV (postoperative  nausea and vomiting) No date: PSVT (paroxysmal supraventricular tachycardia) (HCC) No date: Reactive airway disease No date: Recurrent sinusitis No date: Reflux esophagitis No date: Seasonal allergies No date: Torticollis No date: Torticollis  Reproductive/Obstetrics                             Anesthesia Physical  Anesthesia Plan  ASA: III  Anesthesia Plan: General   Post-op Pain Management:    Induction: Intravenous  PONV Risk Score and Plan: 4 or greater and Propofol infusion and Ondansetron  Airway Management Planned: Natural Airway  Additional Equipment: None  Intra-op Plan:   Post-operative Plan:   Informed Consent: I have reviewed the patients History and Physical, chart, labs and discussed the procedure including the risks, benefits and alternatives for the proposed anesthesia with the patient or authorized representative who has indicated his/her understanding and acceptance.     Dental advisory given  Plan Discussed with: CRNA and Surgeon  Anesthesia Plan Comments: (Discussed risks of anesthesia with patient, including possibility of difficulty with spontaneous ventilation under anesthesia necessitating airway intervention, PONV, and rare risks such as cardiac or respiratory or neurological events. Patient understands.)        Anesthesia Quick Evaluation

## 2020-08-11 NOTE — Interval H&P Note (Signed)
History and Physical Interval Note:  08/11/2020 10:23 AM  Mary Thornton  has presented today for surgery, with the diagnosis of BARRETT'S  ESOPHAGUS; DYSPEPSIA.  The various methods of treatment have been discussed with the patient and family. After consideration of risks, benefits and other options for treatment, the patient has consented to  Procedure(s): COLONOSCOPY WITH PROPOFOL (N/A) ESOPHAGOGASTRODUODENOSCOPY (EGD) WITH PROPOFOL (N/A) as a surgical intervention.  The patient's history has been reviewed, patient examined, no change in status, stable for surgery.  I have reviewed the patient's chart and labs.  Questions were answered to the patient's satisfaction.     Lesly Rubenstein  Ok to proceed with EGD/Colonoscopy

## 2020-08-11 NOTE — Op Note (Signed)
[facility] Gastroenterology Patient Name: Mary Thornton Procedure Date: 08/11/2020 10:27 AM MRN: [id number] Account #: [id number] Date of Birth: 1941/07/08 Admit Type: Outpatient Age: 79 Room: [facility] ENDO ROOM 3 Gender: Female Note Status: Finalized Procedure:             Upper GI endoscopy Indications:           Gastro-esophageal reflux disease, Barrett's esophagus Providers:             Andrey Farmer MD, MD Referring MD:          Leonie Douglas. Doy Hutching, MD (Referring MD) Medicines:             Monitored Anesthesia Care Complications:         No immediate complications. Estimated blood loss:                         Minimal. Procedure:             Pre-Anesthesia Assessment:                        - Prior to the procedure, a History and Physical was                         performed, and patient medications and allergies were                         reviewed. The patient is competent. The risks and                         benefits of the procedure and the sedation options and                         risks were discussed with the patient. All questions                         were answered and informed consent was obtained.                         Patient identification and proposed procedure were                         verified by the physician, the nurse, the anesthetist                         and the technician in the endoscopy suite. Mental                         Status Examination: alert and oriented. Airway                         Examination: normal oropharyngeal airway and neck                         mobility. Respiratory Examination: clear to                         auscultation. CV Examination: normal. Prophylactic  Antibiotics: The patient does not require prophylactic                         antibiotics. Prior Anticoagulants: The patient has                         taken no previous anticoagulant or antiplatelet                          agents. ASA Grade Assessment: II - A patient with mild                         systemic disease. After reviewing the risks and                         benefits, the patient was deemed in satisfactory                         condition to undergo the procedure. The anesthesia                         plan was to use monitored anesthesia care (MAC).                         Immediately prior to administration of medications,                         the patient was re-assessed for adequacy to receive                         sedatives. The heart rate, respiratory rate, oxygen                         saturations, blood pressure, adequacy of pulmonary                         ventilation, and response to care were monitored                         throughout the procedure. The physical status of the                         patient was re-assessed after the procedure.                        After obtaining informed consent, the endoscope was                         passed under direct vision. Throughout the procedure,                         the patient's blood pressure, pulse, and oxygen                         saturations were monitored continuously. The Endoscope                         was introduced through the mouth, and advanced to the  second part of duodenum. The upper GI endoscopy was                         accomplished without difficulty. The patient tolerated                         the procedure well. Findings:      There were esophageal mucosal changes secondary to established       short-segment Barrett's disease, classified as Barrett's stage C1-M1 per       Pr[place] criteria present in the lower third of the esophagus. The maximum       longitudinal extent of these mucosal changes was 1 cm in length. Mucosa       was biopsied with a cold forceps for histology in 4 quadrants at       intervals of 1 cm in the lower third of the esophagus. One specimen        bottle was sent to pathology. Estimated blood loss was minimal.      The entire examined stomach was normal.      The examined duodenum was normal. Impression:            - Esophageal mucosal changes secondary to established                         short-segment Barrett's disease, classified as                         Barrett's stage C1-M1 per Prague criteria. Biopsied.                        - Normal stomach.                        - Normal examined duodenum. Recommendation:        - Discharge patient to home.                        - Resume previous diet.                        - Continue present medications.                        - Await pathology results.                        - Return to referring physician as previously                         scheduled. Procedure Code(s):     --- Professional ---                        [id number], Esophagogastroduodenoscopy, flexible,                         transoral; with biopsy, single or multiple Diagnosis Code(s):     --- Professional ---                        K22.70, Barrett's esophagus without dysplasia  K21.9, Gastro-esophageal reflux disease without                         esophagitis CPT copyright 2019 American Medical Association. All rights reserved. The codes documented in this report are preliminary and upon coder review may  be revised to meet current compliance requirements. Andrey Farmer, MD Andrey Farmer MD, MD 08/11/2020 11:08:48 AM Number of Addenda: 0 Note Initiated On: 08/11/2020 10:27 AM Estimated Blood Loss:  Estimated blood loss was minimal.      [facility]

## 2020-08-11 NOTE — H&P (Signed)
Outpatient short stay form Pre-procedure 08/11/2020 10:20 AM Mary Miyamoto MD, MPH  Primary Physician: Dr. Doy Hutching  Reason for visit:  BE's surveillance and screening colonoscopy  History of present illness:  79 y/o lady with history of BE's without dysplasia here for surveillance. Also for colonoscopy. Had difficult colonoscopy in 2016 so required Barium enema. History of hysterectomy and neck surgeries. No family history of GI malignancies. No blood thinners.   No current facility-administered medications for this encounter.  Medications Prior to Admission  Medication Sig Dispense Refill Last Dose  . aspirin EC 81 MG tablet Take 81 mg by mouth daily.   08/10/2020 at Unknown time  . CARTIA XT 180 MG 24 hr capsule Take 180 mg by mouth 2 (two) times daily.   08/11/2020 at Unknown time  . metoprolol tartrate (LOPRESSOR) 25 MG tablet Take 0.5 tablets (12.5 mg total) by mouth 2 (two) times daily. (Patient taking differently: Take 25 mg by mouth 2 (two) times daily. ) 30 tablet 0 08/11/2020 at Unknown time  . montelukast (SINGULAIR) 10 MG tablet Take 10 mg by mouth daily.    08/11/2020 at Unknown time  . acetaminophen (TYLENOL) 325 MG tablet Take 650 mg by mouth every 6 (six) hours as needed.   08/09/2020  . atorvastatin (LIPITOR) 40 MG tablet Take 1 tablet (40 mg total) by mouth daily. 30 tablet 0   . Biotin 5 MG CAPS Take 5 mg by mouth daily.   08/08/2020  . calcium-vitamin D (OSCAL WITH D) 500-200 MG-UNIT tablet Take 1 tablet by mouth daily.   08/08/2020  . diclofenac (VOLTAREN) 75 MG EC tablet  (Patient not taking: Reported on 08/11/2020)   Not Taking at Unknown time  . loratadine (CLARITIN) 10 MG tablet Take 1 tablet (10 mg total) by mouth daily. (Patient taking differently: Take 10 mg by mouth daily as needed for allergies. ) 30 tablet 0   . Multiple Vitamin (MULTIVITAMIN) tablet Take 1 tablet by mouth daily at 3 pm.    08/08/2020  . Multiple Vitamins-Minerals (PRESERVISION AREDS PO) Take 2  capsules by mouth 2 (two) times daily. Take one capsule in the morning and one in the evening   08/08/2020  . pantoprazole (PROTONIX) 40 MG tablet Take 40 mg by mouth daily before breakfast.    08/09/2020  . timolol (TIMOPTIC) 0.25 % ophthalmic solution Place 1 drop into both eyes daily.   08/09/2020  . tiZANidine (ZANAFLEX) 4 MG tablet Take by mouth.   08/09/2020     Allergies  Allergen Reactions  . Ibuprofen Other (See Comments)    History of esophagogastritis and barretts esophagus No bc powder or like products  . Chlorhexidine Rash  . Cymbalta [Duloxetine Hcl] Itching  . Penicillins Itching and Rash    Has patient had a PCN reaction causing immediate rash, facial/tongue/throat swelling, SOB or lightheadedness with hypotension: No Has patient had a PCN reaction causing severe rash involving mucus membranes or skin necrosis: No Has patient had a PCN reaction that required hospitalization: No Has patient had a PCN reaction occurring within the last 10 years: No If all of the above answers are "NO", then may proceed with Cephalosporin use.  . Sulfa Antibiotics Rash     Past Medical History:  Diagnosis Date  . Arthritis   . Asthma   . Barrett esophagus   . Complication of anesthesia    very hard to wake up. feels like she had too much medicine. also severe vomitting  . Coronary artery  disease   . Diverticulosis   . Dysrhythmia    psvt  . Environmental allergies   . Fibrocystic breast disease   . GERD (gastroesophageal reflux disease)   . Glaucoma   . Hyperlipemia   . Hyperlipidemia   . Hypertension   . Macular degeneration   . Macular degeneration   . 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 PONV (postoperative nausea and vomiting)   . PSVT (paroxysmal supraventricular tachycardia) ([place])   . Reactive airway disease   . Recurrent sinusitis   . Reflux esophagitis   . Seasonal allergies   . Torticollis   . Torticollis     Review of systems:   Otherwise negative.    Physical Exam  Gen: Alert, oriented. Appears stated age.  HEENT: [place]/AT. PERRLA. Lungs: No respiratory distress Abd: soft, benign, no masses. Ext: No edema.     Planned procedures: Proceed with EGD/colonoscopy. The patient understands the nature of the planned procedure, indications, risks, alternatives and potential complications including but not limited to bleeding, infection, perforation, damage to internal organs and possible oversedation/side effects from anesthesia. The patient agrees and gives consent to proceed.  Please refer to procedure notes for findings, recommendations and patient disposition/instructions.     Mary Miyamoto MD, MPH Gastroenterology 08/11/2020  10:20 AM

## 2020-08-11 NOTE — Transfer of Care (Signed)
Immediate Anesthesia Transfer of Care Note  Patient: Mary Thornton  Procedure(s) Performed: COLONOSCOPY WITH PROPOFOL (N/A ) ESOPHAGOGASTRODUODENOSCOPY (EGD) WITH PROPOFOL (N/A )  Patient Location: PACU  Anesthesia Type:General  Level of Consciousness: drowsy  Airway & Oxygen Therapy: Patient Spontanous Breathing and Patient connected to face mask oxygen  Post-op Assessment: Report given to RN and Post -op Vital signs reviewed and stable  Post vital signs: Reviewed and stable  Last Vitals:  Vitals Value Taken Time  BP    Temp    Pulse    Resp    SpO2      Last Pain:  Vitals:   08/11/20 0950  TempSrc: Temporal  PainSc: 0-No pain         Complications: No complications documented.

## 2020-08-11 NOTE — Anesthesia Postprocedure Evaluation (Signed)
Anesthesia Post Note  Patient: Randall Rampersad  Procedure(s) Performed: COLONOSCOPY WITH PROPOFOL (N/A ) ESOPHAGOGASTRODUODENOSCOPY (EGD) WITH PROPOFOL (N/A )  Patient location during evaluation: Endoscopy Anesthesia Type: General Level of consciousness: awake and alert Pain management: pain level controlled Vital Signs Assessment: post-procedure vital signs reviewed and stable Respiratory status: spontaneous breathing, nonlabored ventilation, respiratory function stable and patient connected to nasal cannula oxygen Cardiovascular status: blood pressure returned to baseline and stable Postop Assessment: no apparent nausea or vomiting Anesthetic complications: no   No complications documented.   Last Vitals:  Vitals:   08/11/20 1139 08/11/20 1140  BP: 123/63 123/63  Pulse: 69 67  Resp: 17 15  Temp:    SpO2: 100% 99%    Last Pain:  Vitals:   08/11/20 1139  TempSrc:   PainSc: 0-No pain                 Arita Miss

## 2020-08-11 NOTE — Op Note (Signed)
[facility] Gastroenterology Patient Name: Mary Thornton Procedure Date: 08/11/2020 10:26 AM MRN: [id number] Account #: [id number] Date of Birth: 1941/06/20 Admit Type: Outpatient Age: 79 Room: [facility] ENDO ROOM 3 Gender: Female Note Status: Finalized Procedure:             Colonoscopy Indications:           Screening for colorectal malignant neoplasm Providers:             Andrey Farmer MD, MD Referring MD:          Leonie Douglas. Doy Hutching, MD (Referring MD) Medicines:             Monitored Anesthesia Care Complications:         No immediate complications. Procedure:             Pre-Anesthesia Assessment:                        - Prior to the procedure, a History and Physical was                         performed, and patient medications and allergies were                         reviewed. The patient is competent. The risks and                         benefits of the procedure and the sedation options and                         risks were discussed with the patient. All questions                         were answered and informed consent was obtained.                         Patient identification and proposed procedure were                         verified by the physician, the nurse, the anesthetist                         and the technician in the endoscopy suite. Mental                         Status Examination: alert and oriented. Airway                         Examination: normal oropharyngeal airway and neck                         mobility. Respiratory Examination: clear to                         auscultation. CV Examination: normal. Prophylactic                         Antibiotics: The patient does not require prophylactic  antibiotics. Prior Anticoagulants: The patient has                         taken no previous anticoagulant or antiplatelet                         agents. ASA Grade Assessment: II - A patient with mild                          systemic disease. After reviewing the risks and                         benefits, the patient was deemed in satisfactory                         condition to undergo the procedure. The anesthesia                         plan was to use monitored anesthesia care (MAC).                         Immediately prior to administration of medications,                         the patient was re-assessed for adequacy to receive                         sedatives. The heart rate, respiratory rate, oxygen                         saturations, blood pressure, adequacy of pulmonary                         ventilation, and response to care were monitored                         throughout the procedure. The physical status of the                         patient was re-assessed after the procedure.                        After obtaining informed consent, the colonoscope was                         passed under direct vision. Throughout the procedure,                         the patient's blood pressure, pulse, and oxygen                         saturations were monitored continuously. The                         Colonoscope was introduced through the anus and                         advanced to the the cecum, identified by appendiceal  orifice and ileocecal valve. The colonoscopy was                         technically difficult and complex due to a redundant                         colon. Successful completion of the procedure was                         aided by applying abdominal pressure. The patient                         tolerated the procedure well. The quality of the bowel                         preparation was good. Findings:      The perianal and digital rectal examinations were normal.      A localized area of mildly erythematous mucosa was found in the rectum.       Biopsies were taken with a cold forceps for histology. Estimated blood       loss was  minimal.      The exam was otherwise without abnormality on direct and retroflexion       views. Impression:            - Erythematous mucosa in the rectum. Biopsied.                        - The examination was otherwise normal on direct and                         retroflexion views. Recommendation:        - Discharge patient to home.                        - Resume previous diet.                        - Continue present medications.                        - Repeat colonoscopy is not recommended due to current                         age (29 years or older) for screening purposes.                        - Return to referring physician as previously                         scheduled. Procedure Code(s):     --- Professional ---                        [id number], Colonoscopy, flexible; with biopsy, single or                         multiple Diagnosis Code(s):     --- Professional ---  Z12.11, Encounter for screening for malignant neoplasm                         of colon                        K62.89, Other specified diseases of anus and rectum CPT copyright 2019 American Medical Association. All rights reserved. The codes documented in this report are preliminary and upon coder review may  be revised to meet current compliance requirements. Andrey Farmer, MD Andrey Farmer MD, MD 08/11/2020 11:11:30 AM Number of Addenda: 0 Note Initiated On: 08/11/2020 10:26 AM Scope Withdrawal Time: 0 hours 9 minutes 36 seconds  Total Procedure Duration: 0 hours 20 minutes 24 seconds  Estimated Blood Loss:  Estimated blood loss: none.      [facility]

## 2020-08-12 ENCOUNTER — Encounter: Payer: Self-pay | Admitting: Gastroenterology

## 2020-08-12 LAB — SURGICAL PATHOLOGY

## 2020-09-08 DIAGNOSIS — H353221 Exudative age-related macular degeneration, left eye, with active choroidal neovascularization: Secondary | ICD-10-CM | POA: Diagnosis not present

## 2020-10-08 ENCOUNTER — Ambulatory Visit
Admission: RE | Admit: 2020-10-08 | Discharge: 2020-10-08 | Disposition: A | Discharge status: Home / Self Care | Payer: Medicare HMO | Source: Ambulatory Visit | Attending: Internal Medicine | Admitting: Internal Medicine

## 2020-10-08 ENCOUNTER — Other Ambulatory Visit: Payer: Self-pay

## 2020-10-08 DIAGNOSIS — Z1231 Encounter for screening mammogram for malignant neoplasm of breast: Secondary | ICD-10-CM | POA: Diagnosis not present

## 2020-10-13 DIAGNOSIS — H353221 Exudative age-related macular degeneration, left eye, with active choroidal neovascularization: Secondary | ICD-10-CM | POA: Diagnosis not present

## 2020-10-14 DIAGNOSIS — I208 Other forms of angina pectoris: Secondary | ICD-10-CM | POA: Diagnosis not present

## 2020-10-14 DIAGNOSIS — I471 Supraventricular tachycardia: Secondary | ICD-10-CM | POA: Diagnosis not present

## 2020-10-14 DIAGNOSIS — I10 Essential (primary) hypertension: Secondary | ICD-10-CM | POA: Diagnosis not present

## 2020-10-14 DIAGNOSIS — M436 Torticollis: Secondary | ICD-10-CM | POA: Diagnosis not present

## 2020-10-14 DIAGNOSIS — R0602 Shortness of breath: Secondary | ICD-10-CM | POA: Diagnosis not present

## 2020-10-14 DIAGNOSIS — E782 Mixed hyperlipidemia: Secondary | ICD-10-CM | POA: Diagnosis not present

## 2020-10-14 DIAGNOSIS — J45909 Unspecified asthma, uncomplicated: Secondary | ICD-10-CM | POA: Diagnosis not present

## 2020-10-14 DIAGNOSIS — R001 Bradycardia, unspecified: Secondary | ICD-10-CM | POA: Diagnosis not present

## 2020-10-14 DIAGNOSIS — R002 Palpitations: Secondary | ICD-10-CM | POA: Diagnosis not present

## 2020-10-14 DIAGNOSIS — Z79899 Other long term (current) drug therapy: Secondary | ICD-10-CM | POA: Diagnosis not present

## 2020-10-14 DIAGNOSIS — E7800 Pure hypercholesterolemia, unspecified: Secondary | ICD-10-CM | POA: Diagnosis not present

## 2020-10-21 DIAGNOSIS — Z0000 Encounter for general adult medical examination without abnormal findings: Secondary | ICD-10-CM | POA: Diagnosis not present

## 2020-10-21 DIAGNOSIS — I471 Supraventricular tachycardia: Secondary | ICD-10-CM | POA: Diagnosis not present

## 2020-10-21 DIAGNOSIS — I2510 Atherosclerotic heart disease of native coronary artery without angina pectoris: Secondary | ICD-10-CM | POA: Diagnosis not present

## 2020-10-21 DIAGNOSIS — E7800 Pure hypercholesterolemia, unspecified: Secondary | ICD-10-CM | POA: Diagnosis not present

## 2020-10-21 DIAGNOSIS — I10 Essential (primary) hypertension: Secondary | ICD-10-CM | POA: Diagnosis not present

## 2020-10-21 DIAGNOSIS — Z79899 Other long term (current) drug therapy: Secondary | ICD-10-CM | POA: Diagnosis not present

## 2020-11-04 DIAGNOSIS — H401334 Pigmentary glaucoma, bilateral, indeterminate stage: Secondary | ICD-10-CM | POA: Diagnosis not present

## 2020-11-11 DIAGNOSIS — H2512 Age-related nuclear cataract, left eye: Secondary | ICD-10-CM | POA: Diagnosis not present

## 2020-11-17 DIAGNOSIS — H353221 Exudative age-related macular degeneration, left eye, with active choroidal neovascularization: Secondary | ICD-10-CM | POA: Diagnosis not present

## 2020-12-28 DIAGNOSIS — H353221 Exudative age-related macular degeneration, left eye, with active choroidal neovascularization: Secondary | ICD-10-CM | POA: Diagnosis not present

## 2020-12-28 DIAGNOSIS — H353211 Exudative age-related macular degeneration, right eye, with active choroidal neovascularization: Secondary | ICD-10-CM | POA: Diagnosis not present

## 2021-01-04 DIAGNOSIS — H353221 Exudative age-related macular degeneration, left eye, with active choroidal neovascularization: Secondary | ICD-10-CM | POA: Diagnosis not present

## 2021-01-04 DIAGNOSIS — H353211 Exudative age-related macular degeneration, right eye, with active choroidal neovascularization: Secondary | ICD-10-CM | POA: Diagnosis not present

## 2021-01-13 DIAGNOSIS — H401331 Pigmentary glaucoma, bilateral, mild stage: Secondary | ICD-10-CM | POA: Diagnosis not present

## 2021-01-13 DIAGNOSIS — I10 Essential (primary) hypertension: Secondary | ICD-10-CM | POA: Diagnosis not present

## 2021-01-13 DIAGNOSIS — Z79899 Other long term (current) drug therapy: Secondary | ICD-10-CM | POA: Diagnosis not present

## 2021-01-21 DIAGNOSIS — I10 Essential (primary) hypertension: Secondary | ICD-10-CM | POA: Diagnosis not present

## 2021-01-21 DIAGNOSIS — I471 Supraventricular tachycardia: Secondary | ICD-10-CM | POA: Diagnosis not present

## 2021-01-21 DIAGNOSIS — Z79899 Other long term (current) drug therapy: Secondary | ICD-10-CM | POA: Diagnosis not present

## 2021-01-21 DIAGNOSIS — Z0000 Encounter for general adult medical examination without abnormal findings: Secondary | ICD-10-CM | POA: Diagnosis not present

## 2021-01-21 DIAGNOSIS — I2510 Atherosclerotic heart disease of native coronary artery without angina pectoris: Secondary | ICD-10-CM | POA: Diagnosis not present

## 2021-01-21 DIAGNOSIS — E785 Hyperlipidemia, unspecified: Secondary | ICD-10-CM | POA: Diagnosis not present

## 2021-02-01 DIAGNOSIS — H353221 Exudative age-related macular degeneration, left eye, with active choroidal neovascularization: Secondary | ICD-10-CM | POA: Diagnosis not present

## 2021-02-03 DIAGNOSIS — I10 Essential (primary) hypertension: Secondary | ICD-10-CM | POA: Diagnosis not present

## 2021-02-03 DIAGNOSIS — M545 Low back pain, unspecified: Secondary | ICD-10-CM | POA: Diagnosis not present

## 2021-02-03 DIAGNOSIS — I471 Supraventricular tachycardia: Secondary | ICD-10-CM | POA: Diagnosis not present

## 2021-02-03 DIAGNOSIS — M5136 Other intervertebral disc degeneration, lumbar region: Secondary | ICD-10-CM | POA: Diagnosis not present

## 2021-02-09 DIAGNOSIS — H353211 Exudative age-related macular degeneration, right eye, with active choroidal neovascularization: Secondary | ICD-10-CM | POA: Diagnosis not present

## 2021-02-19 DIAGNOSIS — I10 Essential (primary) hypertension: Secondary | ICD-10-CM | POA: Diagnosis not present

## 2021-02-19 DIAGNOSIS — M5136 Other intervertebral disc degeneration, lumbar region: Secondary | ICD-10-CM | POA: Diagnosis not present

## 2021-02-24 ENCOUNTER — Other Ambulatory Visit: Payer: Self-pay | Admitting: Internal Medicine

## 2021-02-24 DIAGNOSIS — M5136 Other intervertebral disc degeneration, lumbar region: Secondary | ICD-10-CM

## 2021-03-08 DIAGNOSIS — H353221 Exudative age-related macular degeneration, left eye, with active choroidal neovascularization: Secondary | ICD-10-CM | POA: Diagnosis not present

## 2021-03-10 ENCOUNTER — Ambulatory Visit
Admission: RE | Admit: 2021-03-10 | Discharge: 2021-03-10 | Disposition: A | Discharge status: Home / Self Care | Payer: Medicare HMO | Source: Ambulatory Visit | Attending: Internal Medicine | Admitting: Internal Medicine

## 2021-03-10 ENCOUNTER — Other Ambulatory Visit: Payer: Self-pay

## 2021-03-10 DIAGNOSIS — M5136 Other intervertebral disc degeneration, lumbar region: Secondary | ICD-10-CM | POA: Insufficient documentation

## 2021-03-10 DIAGNOSIS — M545 Low back pain, unspecified: Secondary | ICD-10-CM | POA: Diagnosis not present

## 2021-03-16 DIAGNOSIS — H353211 Exudative age-related macular degeneration, right eye, with active choroidal neovascularization: Secondary | ICD-10-CM | POA: Diagnosis not present

## 2021-03-31 DIAGNOSIS — I700 Atherosclerosis of aorta: Secondary | ICD-10-CM | POA: Diagnosis not present

## 2021-03-31 DIAGNOSIS — R001 Bradycardia, unspecified: Secondary | ICD-10-CM | POA: Diagnosis not present

## 2021-03-31 DIAGNOSIS — I10 Essential (primary) hypertension: Secondary | ICD-10-CM | POA: Diagnosis not present

## 2021-03-31 DIAGNOSIS — R0602 Shortness of breath: Secondary | ICD-10-CM | POA: Diagnosis not present

## 2021-03-31 DIAGNOSIS — I471 Supraventricular tachycardia: Secondary | ICD-10-CM | POA: Diagnosis not present

## 2021-03-31 DIAGNOSIS — E782 Mixed hyperlipidemia: Secondary | ICD-10-CM | POA: Diagnosis not present

## 2021-03-31 DIAGNOSIS — I208 Other forms of angina pectoris: Secondary | ICD-10-CM | POA: Diagnosis not present

## 2021-03-31 DIAGNOSIS — J45909 Unspecified asthma, uncomplicated: Secondary | ICD-10-CM | POA: Diagnosis not present

## 2021-03-31 DIAGNOSIS — R002 Palpitations: Secondary | ICD-10-CM | POA: Diagnosis not present

## 2021-04-09 DIAGNOSIS — I10 Essential (primary) hypertension: Secondary | ICD-10-CM | POA: Diagnosis not present

## 2021-04-09 DIAGNOSIS — Z79899 Other long term (current) drug therapy: Secondary | ICD-10-CM | POA: Diagnosis not present

## 2021-04-09 DIAGNOSIS — E7800 Pure hypercholesterolemia, unspecified: Secondary | ICD-10-CM | POA: Diagnosis not present

## 2021-04-16 DIAGNOSIS — H353221 Exudative age-related macular degeneration, left eye, with active choroidal neovascularization: Secondary | ICD-10-CM | POA: Diagnosis not present

## 2021-04-16 DIAGNOSIS — I2510 Atherosclerotic heart disease of native coronary artery without angina pectoris: Secondary | ICD-10-CM | POA: Diagnosis not present

## 2021-04-16 DIAGNOSIS — E7800 Pure hypercholesterolemia, unspecified: Secondary | ICD-10-CM | POA: Diagnosis not present

## 2021-04-16 DIAGNOSIS — I10 Essential (primary) hypertension: Secondary | ICD-10-CM | POA: Diagnosis not present

## 2021-04-20 DIAGNOSIS — H353211 Exudative age-related macular degeneration, right eye, with active choroidal neovascularization: Secondary | ICD-10-CM | POA: Diagnosis not present

## 2021-04-23 DIAGNOSIS — M5441 Lumbago with sciatica, right side: Secondary | ICD-10-CM | POA: Diagnosis not present

## 2021-04-23 DIAGNOSIS — M5442 Lumbago with sciatica, left side: Secondary | ICD-10-CM | POA: Diagnosis not present

## 2021-04-27 DIAGNOSIS — M5416 Radiculopathy, lumbar region: Secondary | ICD-10-CM | POA: Diagnosis not present

## 2021-04-27 DIAGNOSIS — M48062 Spinal stenosis, lumbar region with neurogenic claudication: Secondary | ICD-10-CM | POA: Diagnosis not present

## 2021-05-17 DIAGNOSIS — G8929 Other chronic pain: Secondary | ICD-10-CM | POA: Diagnosis not present

## 2021-05-17 DIAGNOSIS — M5441 Lumbago with sciatica, right side: Secondary | ICD-10-CM | POA: Diagnosis not present

## 2021-05-17 DIAGNOSIS — M5442 Lumbago with sciatica, left side: Secondary | ICD-10-CM | POA: Diagnosis not present

## 2021-05-24 DIAGNOSIS — M5442 Lumbago with sciatica, left side: Secondary | ICD-10-CM | POA: Diagnosis not present

## 2021-05-24 DIAGNOSIS — M5441 Lumbago with sciatica, right side: Secondary | ICD-10-CM | POA: Diagnosis not present

## 2021-05-24 DIAGNOSIS — G8929 Other chronic pain: Secondary | ICD-10-CM | POA: Diagnosis not present

## 2021-05-25 DIAGNOSIS — H353221 Exudative age-related macular degeneration, left eye, with active choroidal neovascularization: Secondary | ICD-10-CM | POA: Diagnosis not present

## 2021-05-28 DIAGNOSIS — Z13820 Encounter for screening for osteoporosis: Secondary | ICD-10-CM | POA: Diagnosis not present

## 2021-05-28 DIAGNOSIS — I10 Essential (primary) hypertension: Secondary | ICD-10-CM | POA: Diagnosis not present

## 2021-05-31 DIAGNOSIS — M5441 Lumbago with sciatica, right side: Secondary | ICD-10-CM | POA: Diagnosis not present

## 2021-05-31 DIAGNOSIS — G8929 Other chronic pain: Secondary | ICD-10-CM | POA: Diagnosis not present

## 2021-05-31 DIAGNOSIS — M5442 Lumbago with sciatica, left side: Secondary | ICD-10-CM | POA: Diagnosis not present

## 2021-06-01 DIAGNOSIS — H353211 Exudative age-related macular degeneration, right eye, with active choroidal neovascularization: Secondary | ICD-10-CM | POA: Diagnosis not present

## 2021-06-08 DIAGNOSIS — M5441 Lumbago with sciatica, right side: Secondary | ICD-10-CM | POA: Diagnosis not present

## 2021-06-08 DIAGNOSIS — M5442 Lumbago with sciatica, left side: Secondary | ICD-10-CM | POA: Diagnosis not present

## 2021-06-08 DIAGNOSIS — M8588 Other specified disorders of bone density and structure, other site: Secondary | ICD-10-CM | POA: Diagnosis not present

## 2021-06-08 DIAGNOSIS — G8929 Other chronic pain: Secondary | ICD-10-CM | POA: Diagnosis not present

## 2021-06-16 DIAGNOSIS — M25542 Pain in joints of left hand: Secondary | ICD-10-CM | POA: Diagnosis not present

## 2021-06-16 DIAGNOSIS — M67432 Ganglion, left wrist: Secondary | ICD-10-CM | POA: Diagnosis not present

## 2021-06-22 DIAGNOSIS — H353221 Exudative age-related macular degeneration, left eye, with active choroidal neovascularization: Secondary | ICD-10-CM | POA: Diagnosis not present

## 2021-06-24 DIAGNOSIS — M5441 Lumbago with sciatica, right side: Secondary | ICD-10-CM | POA: Diagnosis not present

## 2021-06-24 DIAGNOSIS — M5442 Lumbago with sciatica, left side: Secondary | ICD-10-CM | POA: Diagnosis not present

## 2021-07-08 ENCOUNTER — Other Ambulatory Visit: Payer: Self-pay

## 2021-07-08 ENCOUNTER — Inpatient Hospital Stay: Payer: Medicare HMO | Attending: Oncology

## 2021-07-08 DIAGNOSIS — D472 Monoclonal gammopathy: Secondary | ICD-10-CM | POA: Insufficient documentation

## 2021-07-08 DIAGNOSIS — Z7982 Long term (current) use of aspirin: Secondary | ICD-10-CM | POA: Diagnosis not present

## 2021-07-08 DIAGNOSIS — J45909 Unspecified asthma, uncomplicated: Secondary | ICD-10-CM | POA: Insufficient documentation

## 2021-07-08 DIAGNOSIS — E785 Hyperlipidemia, unspecified: Secondary | ICD-10-CM | POA: Insufficient documentation

## 2021-07-08 DIAGNOSIS — Z79899 Other long term (current) drug therapy: Secondary | ICD-10-CM | POA: Diagnosis not present

## 2021-07-08 DIAGNOSIS — I10 Essential (primary) hypertension: Secondary | ICD-10-CM | POA: Insufficient documentation

## 2021-07-08 LAB — COMPREHENSIVE METABOLIC PANEL
ALT: 32 U/L (ref 0–44)
AST: 31 U/L (ref 15–41)
Albumin: 4.3 g/dL (ref 3.5–5.0)
Alkaline Phosphatase: 64 U/L (ref 38–126)
Anion gap: 6 (ref 5–15)
BUN: 24 mg/dL — ABNORMAL HIGH (ref 8–23)
CO2: 29 mmol/L (ref 22–32)
Calcium: 8.6 mg/dL — ABNORMAL LOW (ref 8.9–10.3)
Chloride: 103 mmol/L (ref 98–111)
Creatinine, Ser: 0.73 mg/dL (ref 0.44–1.00)
GFR, Estimated: >60 mL/min (ref >60)
Glucose, Bld: 98 mg/dL (ref 70–99)
Potassium: 4.6 mmol/L (ref 3.5–5.1)
Sodium: 138 mmol/L (ref 135–145)
Total Bilirubin: 0.8 mg/dL (ref 0.3–1.2)
Total Protein: 6.6 g/dL (ref 6.5–8.1)

## 2021-07-08 LAB — CBC WITH DIFFERENTIAL/PLATELET
Abs Immature Granulocytes: 0.01 10*3/uL (ref 0.00–0.07)
Basophils Absolute: 0 10*3/uL (ref 0.0–0.1)
Basophils Relative: 1 %
Eosinophils Absolute: 0.3 10*3/uL (ref 0.0–0.5)
Eosinophils Relative: 5 %
HCT: 35.2 % — ABNORMAL LOW (ref 36.0–46.0)
Hemoglobin: 11.4 g/dL — ABNORMAL LOW (ref 12.0–15.0)
Immature Granulocytes: 0 %
Lymphocytes Relative: 23 %
Lymphs Abs: 1.5 10*3/uL (ref 0.7–4.0)
MCH: 31.6 pg (ref 26.0–34.0)
MCHC: 32.4 g/dL (ref 30.0–36.0)
MCV: 97.5 fL (ref 80.0–100.0)
Monocytes Absolute: 0.6 10*3/uL (ref 0.1–1.0)
Monocytes Relative: 10 %
Neutro Abs: 3.8 10*3/uL (ref 1.7–7.7)
Neutrophils Relative %: 61 %
Platelets: 267 10*3/uL (ref 150–400)
RBC: 3.61 MIL/uL — ABNORMAL LOW (ref 3.87–5.11)
RDW: 13.2 % (ref 11.5–15.5)
WBC: 6.2 10*3/uL (ref 4.0–10.5)
nRBC: 0 % (ref 0.0–0.2)

## 2021-07-09 ENCOUNTER — Other Ambulatory Visit: Payer: Self-pay

## 2021-07-09 DIAGNOSIS — J45909 Unspecified asthma, uncomplicated: Secondary | ICD-10-CM | POA: Diagnosis not present

## 2021-07-09 DIAGNOSIS — Z79899 Other long term (current) drug therapy: Secondary | ICD-10-CM | POA: Diagnosis not present

## 2021-07-09 DIAGNOSIS — D472 Monoclonal gammopathy: Secondary | ICD-10-CM | POA: Diagnosis not present

## 2021-07-09 DIAGNOSIS — Z7982 Long term (current) use of aspirin: Secondary | ICD-10-CM | POA: Diagnosis not present

## 2021-07-09 DIAGNOSIS — I10 Essential (primary) hypertension: Secondary | ICD-10-CM | POA: Diagnosis not present

## 2021-07-09 DIAGNOSIS — E785 Hyperlipidemia, unspecified: Secondary | ICD-10-CM | POA: Diagnosis not present

## 2021-07-09 LAB — KAPPA/LAMBDA LIGHT CHAINS
Kappa free light chain: 61 mg/L — ABNORMAL HIGH (ref 3.3–19.4)
Kappa, lambda light chain ratio: 4.55 — ABNORMAL HIGH (ref 0.26–1.65)
Lambda free light chains: 13.4 mg/L (ref 5.7–26.3)

## 2021-07-12 DIAGNOSIS — H6012 Cellulitis of left external ear: Secondary | ICD-10-CM | POA: Diagnosis not present

## 2021-07-12 LAB — MULTIPLE MYELOMA PANEL, SERUM
Albumin SerPl Elph-Mcnc: 3.7 g/dL (ref 2.9–4.4)
Albumin/Glob SerPl: 1.7 (ref 0.7–1.7)
Alpha 1: 0.2 g/dL (ref 0.0–0.4)
Alpha2 Glob SerPl Elph-Mcnc: 0.7 g/dL (ref 0.4–1.0)
B-Globulin SerPl Elph-Mcnc: 0.8 g/dL (ref 0.7–1.3)
Gamma Glob SerPl Elph-Mcnc: 0.5 g/dL (ref 0.4–1.8)
Globulin, Total: 2.3 g/dL (ref 2.2–3.9)
IgA: 91 mg/dL (ref 64–422)
IgG (Immunoglobin G), Serum: 605 mg/dL (ref 586–1602)
IgM (Immunoglobulin M), Srm: 34 mg/dL (ref 26–217)
M Protein SerPl Elph-Mcnc: 0.3 g/dL — ABNORMAL HIGH
Total Protein ELP: 6 g/dL (ref 6.0–8.5)

## 2021-07-13 LAB — PROTEIN ELECTRO, RANDOM URINE
Albumin ELP, Urine: 0 %
Alpha-1-Globulin, U: 0 %
Alpha-2-Globulin, U: 0 %
Beta Globulin, U: 0 %
Gamma Globulin, U: 0 %
Total Protein, Urine: <4 mg/dL

## 2021-07-14 DIAGNOSIS — H401331 Pigmentary glaucoma, bilateral, mild stage: Secondary | ICD-10-CM | POA: Diagnosis not present

## 2021-07-15 ENCOUNTER — Inpatient Hospital Stay: Payer: Medicare HMO | Admitting: Nurse Practitioner

## 2021-07-15 ENCOUNTER — Other Ambulatory Visit: Payer: Self-pay

## 2021-07-15 ENCOUNTER — Encounter: Payer: Self-pay | Admitting: Nurse Practitioner

## 2021-07-15 VITALS — BP 168/73 | HR 58 | Temp 96.2°F | Resp 20 | Wt 109.1 lb

## 2021-07-15 DIAGNOSIS — J45909 Unspecified asthma, uncomplicated: Secondary | ICD-10-CM | POA: Diagnosis not present

## 2021-07-15 DIAGNOSIS — D472 Monoclonal gammopathy: Secondary | ICD-10-CM | POA: Diagnosis not present

## 2021-07-15 DIAGNOSIS — E785 Hyperlipidemia, unspecified: Secondary | ICD-10-CM | POA: Diagnosis not present

## 2021-07-15 DIAGNOSIS — Z79899 Other long term (current) drug therapy: Secondary | ICD-10-CM | POA: Diagnosis not present

## 2021-07-15 DIAGNOSIS — I10 Essential (primary) hypertension: Secondary | ICD-10-CM | POA: Diagnosis not present

## 2021-07-15 DIAGNOSIS — Z7982 Long term (current) use of aspirin: Secondary | ICD-10-CM | POA: Diagnosis not present

## 2021-07-15 NOTE — Progress Notes (Signed)
Hematology/Oncology Consult note [facility] Telephone:([phone] [phone]   Patient Care Team: Idelle Crouch, MD as PCP - General (Internal Medicine)  REFERRING PROVIDER: Idelle Crouch, MD  CHIEF COMPLAINTS/REASON FOR VISIT:  Follow up for MGUS  HISTORY OF PRESENTING ILLNESS:  Mary Thornton is a 80 y.o. female who was seen in consultation at the request of Idelle Crouch, MD for evaluation of abnormal SPEP results.   Patient recently had work up done at Neurologist's office for lower extremity neuropathy.. Labs reviewed,  03/18/2019 SPEP showed 0.2 M spike, repeat SPEP on 06/27/2019 showed M protein of 0.2. IFE showed IgG Kappa monoclonal protein.  No aggravating or alleviated factors.  Associated signs or symptoms:  Neuropathy: Cool sensation in her face since around June 2019.  Symptoms have been gradually worsening.  She is not diabetic.  Also numbness in her bilateral feet since around October 2019 after lumbar decompression surgery.  Symptoms are worse when she lays down. Denies weight loss, fever chills, night sweating   Bone pain: Chronic back pain.  INTERVAL HISTORY Mary Thornton is a 80 y.o. female with above history of MGUS who returns to clinic for discussion of lab results and further evaluation.  She denies new bone pain.  Has chronic back pain postsurgical.  Has been working with physical therapy which is improved her symptoms.  No new fatigue or weakness.  No numbness or tingling.  No recurrent infections.  No abnormal bleeding or bruising.  No chest pain or shortness of breath.  No constipation, diarrhea, nausea, vomiting.  No urinary complaints.  No further specific complaints today.  Review of Systems  Constitutional:  Negative for appetite change, chills, fatigue, fever and unexpected weight change.  HENT:   Negative for mouth sores, sore throat and trouble swallowing.   Respiratory:  Negative for chest  tightness and shortness of breath.   Cardiovascular:  Negative for chest pain and leg swelling.  Gastrointestinal:  Negative for abdominal pain, constipation, diarrhea, nausea and vomiting.  Endocrine: Negative for hot flashes.  Genitourinary:  Negative for bladder incontinence and dysuria.   Musculoskeletal:  Positive for back pain. Negative for arthralgias, flank pain, myalgias, neck pain and neck stiffness.  Skin:  Negative for itching, rash and wound.  Neurological:  Negative for dizziness, headaches, light-headedness and numbness.  Hematological:  Negative for adenopathy. Does not bruise/bleed easily.  Psychiatric/Behavioral:  Negative for confusion, depression and sleep disturbance. The patient is not nervous/anxious.     MEDICAL HISTORY:  Past Medical History:  Diagnosis Date   Arthritis    Asthma    Barrett esophagus    Complication of anesthesia    very hard to wake up. feels like she had too much medicine. 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 Herbert Deaner   COLONOSCOPY WITH PROPOFOL N/A 06/23/2015   Procedure: COLONOSCOPY WITH PROPOFOL;  Surgeon: [name], MD;  Location[facility] ENDOSCOPY;  Service: Endoscopy;  Laterality: N/A;    COLONOSCOPY WITH PROPOFOL N/A 08/11/2020   Procedure: COLONOSCOPY WITH PROPOFOL;  Surgeon: Lesly Rubenstein, MD;  Location: [facility] ENDOSCOPY;  Service: Endoscopy;  Laterality: N/A;   ESOPHAGOGASTRODUODENOSCOPY (EGD) WITH PROPOFOL N/A 06/23/2015   Procedure: ESOPHAGOGASTRODUODENOSCOPY (EGD) WITH PROPOFOL;  Surgeon: [name], MD;  Location: [facility] ENDOSCOPY;  Service: Endoscopy;  Laterality: N/A;   ESOPHAGOGASTRODUODENOSCOPY (EGD) WITH PROPOFOL N/A 11/28/2017   Procedure: ESOPHAGOGASTRODUODENOSCOPY (EGD) WITH PROPOFOL;  Surgeon: [name], MD;  Location: [facility] ENDOSCOPY;  Service: Endoscopy;  Laterality: N/A;   ESOPHAGOGASTRODUODENOSCOPY (EGD) WITH PROPOFOL N/A 01/18/2018   Procedure: ESOPHAGOGASTRODUODENOSCOPY (EGD) WITH PROPOFOL;  Surgeon: [name], MD;  Location: [facility] ENDOSCOPY;  Service: Endoscopy;  Laterality: N/A;   ESOPHAGOGASTRODUODENOSCOPY (EGD) WITH PROPOFOL N/A 08/11/2020   Procedure: ESOPHAGOGASTRODUODENOSCOPY (EGD) WITH PROPOFOL;  Surgeon: Lesly Rubenstein, MD;  Location: [facility] ENDOSCOPY;  Service: Endoscopy;  Laterality: N/A;   JOINT REPLACEMENT Left 2009   total knee replacement   KNEE ARTHROSCOPY     LUMBAR LAMINECTOMY/DECOMPRESSION MICRODISCECTOMY N/A 08/22/2018   Procedure: LUMBAR LAMINECTOMY/DECOMPRESSION MICRODISCECTOMY 1 LEVEL-L2-5;  Surgeon: Meade Maw, MD;  Location: [facility] ORS;  Service: Neurosurgery;  Laterality: N/A;   Torticollis surgery x 2      SOCIAL HISTORY: Social History   Socioeconomic History   Marital status: Widowed    Spouse name: Not on file   Number of children: Not on file   Years of education: Not on file   Highest education level: Not on file  Occupational History   Occupation: [employer]    Comment: retired  Tobacco Use   Smoking status: Never   Smokeless tobacco: Never  Vaping Use   Vaping Use: Never used  Substance and Sexual Activity   Alcohol use: Never   Drug use: Never   Sexual activity: Not  Currently    Birth control/protection: Surgical  Other Topics Concern   Not on file  Social History Narrative   Not on file   Social Determinants of Health   Financial Resource Strain: Not on file  Food Insecurity: Not on file  Transportation Needs: Not on file  Physical Activity: Not on file  Stress: Not on file  Social Connections: Not on file  Intimate Partner Violence: Not on file    FAMILY HISTORY: Family History  Problem Relation Age of Onset   Heart disease Mother    Heart disease Sister    Cancer Neg Hx    Diabetes Neg Hx    Breast cancer Neg Hx    Amblyopia Neg Hx    Blindness Neg Hx    Cataracts Neg Hx    Glaucoma Neg Hx    Macular degeneration Neg Hx    Retinal detachment Neg Hx    Strabismus Neg Hx    Retinitis pigmentosa Neg Hx     ALLERGIES:  is allergic to ibuprofen, chlorhexidine, cymbalta [duloxetine hcl], penicillins, and sulfa antibiotics.  MEDICATIONS:  Current Outpatient Medications  Medication Sig Dispense Refill   acetaminophen (TYLENOL) 325 MG tablet Take 650 mg by mouth every 6 (six) hours as needed.     aspirin EC 81 MG tablet  Take 81 mg by mouth daily.     atorvastatin (LIPITOR) 40 MG tablet Take 1 tablet (40 mg total) by mouth daily. 30 tablet 0   calcium-vitamin D (OSCAL WITH D) 500-200 MG-UNIT tablet Take 1 tablet by mouth daily.     CARTIA XT 180 MG 24 hr capsule Take 180 mg by mouth 2 (two) times daily.     loratadine (CLARITIN) 10 MG tablet Take 1 tablet (10 mg total) by mouth daily. (Patient taking differently: Take 10 mg by mouth daily as needed for allergies.) 30 tablet 0   montelukast (SINGULAIR) 10 MG tablet Take 10 mg by mouth daily.      Multiple Vitamin (MULTIVITAMIN) tablet Take 1 tablet by mouth daily at 3 pm.      Multiple Vitamins-Minerals (PRESERVISION AREDS PO) Take 2 capsules by mouth 2 (two) times daily. Take one capsule in the morning and one in the evening     pantoprazole (PROTONIX) 40 MG tablet Take 40 mg by mouth  daily before breakfast.      timolol (TIMOPTIC) 0.25 % ophthalmic solution Place 1 drop into both eyes daily.     tiZANidine (ZANAFLEX) 4 MG tablet Take by mouth.     Biotin 5 MG CAPS Take 5 mg by mouth daily. (Patient not taking: Reported on 07/15/2021)     diclofenac (VOLTAREN) 75 MG EC tablet  (Patient not taking: No sig reported)     metoprolol tartrate (LOPRESSOR) 25 MG tablet Take 0.5 tablets (12.5 mg total) by mouth 2 (two) times daily. (Patient not taking: Reported on 07/15/2021) 30 tablet 0   No current facility-administered medications for this visit.   PHYSICAL EXAMINATION: ECOG PERFORMANCE STATUS: 1 - Symptomatic but completely ambulatory Vitals:   07/15/21 0952  BP: (!) 168/73  Pulse: (!) 58  Resp: 20  Temp: (!) 96.2 F (35.7 C)  SpO2: 99%   Filed Weights   07/15/21 0952  Weight: 109 lb 1.6 oz (49.5 kg)    Physical Exam Constitutional:      General: She is not in acute distress.    Appearance: She is well-developed. She is not ill-appearing.  HENT:     Head: Atraumatic.     Nose: Nose normal.     Mouth/Throat:     Pharynx: No oropharyngeal exudate.  Eyes:     General: No scleral icterus.    Conjunctiva/sclera: Conjunctivae normal.  Cardiovascular:     Rate and Rhythm: Normal rate and regular rhythm.  Pulmonary:     Effort: Pulmonary effort is normal.     Breath sounds: Normal breath sounds.  Abdominal:     General: Bowel sounds are normal. There is no distension.     Palpations: Abdomen is soft.     Tenderness: There is no abdominal tenderness.  Musculoskeletal:        General: No tenderness or deformity. Normal range of motion.     Cervical back: Normal range of motion and neck supple. No bony tenderness.     Thoracic back: No bony tenderness.     Lumbar back: No bony tenderness.  Skin:    General: Skin is warm and dry.     Coloration: Skin is not pale.  Neurological:     General: No focal deficit present.     Mental Status: She is alert and  oriented to person, place, and time.  Psychiatric:        Mood and Affect: Mood normal.        Behavior:  Behavior normal.    LABORATORY DATA:  I have reviewed the data as listed CBC Latest Ref Rng & Units 07/08/2021 07/08/2020 01/06/2020  WBC 4.0 - 10.5 K/uL 6.2 6.1 5.3  Hemoglobin 12.0 - 15.0 g/dL 11.4(L) 12.5 12.1  Hematocrit 36.0 - 46.0 % 35.2(L) 37.4 38.5  Platelets 150 - 400 K/uL 267 259 274   CMP Latest Ref Rng & Units 07/08/2021 07/08/2020 01/06/2020  Glucose 70 - 99 mg/dL 98 94 97  BUN 8 - 23 mg/dL 24(H) 19 14  Creatinine 0.44 - 1.00 mg/dL 0.73 0.56 0.55  Sodium 135 - 145 mmol/L 138 141 137  Potassium 3.5 - 5.1 mmol/L 4.6 4.1 3.9  Chloride 98 - 111 mmol/L 103 103 102  CO2 22 - 32 mmol/L '29 29 27  '$ Calcium 8.9 - 10.3 mg/dL 8.6(L) 8.8(L) 8.7(L)  Total Protein 6.5 - 8.1 g/dL 6.6 7.2 6.9  Total Bilirubin 0.3 - 1.2 mg/dL 0.8 0.8 0.8  Alkaline Phos 38 - 126 U/L 64 63 68  AST 15 - 41 U/L 31 33 28  ALT 0 - 44 U/L 32 29 27   Iron/TIBC/Ferritin/ %Sat No results found for: IRON, TIBC, FERRITIN, IRONPCTSAT    RADIOGRAPHIC STUDIES: I have personally reviewed the radiological images as listed and agreed with the findings in the report.  No results found.   ASSESSMENT & PLAN:  No diagnosis found.  MGUS- IgG MGUS. M protein relatively stable at 0.3. Previously 0.2. Light chain ratio improved, previously 5.3, now 4.55. UPEP is negative. No new bone pain. Calcium is stable. BUN slightly elevated at 24 but normal creatinine. Hemoglobin 11.4 normocytic. I recommend continued surveillance. Can also consider bone survey RTC in 1 year for labs: UPEP, cbc, cmp, MM panel and light chains, follow up with Dr Tasia Catchings a week later.   No orders of the defined types were placed in this encounter.   All questions were answered. The patient knows to call the clinic with any problems questions or concerns.  Beckey Rutter, DNP, AGNP-C [place] at [facility] [phone] (clinic) 07/15/2021   CC:  Idelle Crouch, MD

## 2021-07-20 DIAGNOSIS — H353221 Exudative age-related macular degeneration, left eye, with active choroidal neovascularization: Secondary | ICD-10-CM | POA: Diagnosis not present

## 2021-07-21 ENCOUNTER — Other Ambulatory Visit: Payer: Self-pay

## 2021-07-21 ENCOUNTER — Other Ambulatory Visit: Payer: Self-pay | Admitting: Physician Assistant

## 2021-07-21 ENCOUNTER — Ambulatory Visit
Admission: RE | Admit: 2021-07-21 | Discharge: 2021-07-21 | Disposition: A | Discharge status: Home / Self Care | Payer: Medicare HMO | Source: Ambulatory Visit | Attending: Physician Assistant | Admitting: Physician Assistant

## 2021-07-21 DIAGNOSIS — R600 Localized edema: Secondary | ICD-10-CM

## 2021-07-27 DIAGNOSIS — H353211 Exudative age-related macular degeneration, right eye, with active choroidal neovascularization: Secondary | ICD-10-CM | POA: Diagnosis not present

## 2021-08-10 DIAGNOSIS — H04123 Dry eye syndrome of bilateral lacrimal glands: Secondary | ICD-10-CM | POA: Diagnosis not present

## 2021-08-21 DIAGNOSIS — J209 Acute bronchitis, unspecified: Secondary | ICD-10-CM | POA: Diagnosis not present

## 2021-08-31 DIAGNOSIS — H353221 Exudative age-related macular degeneration, left eye, with active choroidal neovascularization: Secondary | ICD-10-CM | POA: Diagnosis not present

## 2021-09-02 ENCOUNTER — Other Ambulatory Visit: Payer: Self-pay | Admitting: Internal Medicine

## 2021-09-02 DIAGNOSIS — Z1231 Encounter for screening mammogram for malignant neoplasm of breast: Secondary | ICD-10-CM

## 2021-09-06 DIAGNOSIS — H43813 Vitreous degeneration, bilateral: Secondary | ICD-10-CM | POA: Diagnosis not present

## 2021-09-28 DIAGNOSIS — H353211 Exudative age-related macular degeneration, right eye, with active choroidal neovascularization: Secondary | ICD-10-CM | POA: Diagnosis not present

## 2021-09-30 DIAGNOSIS — I471 Supraventricular tachycardia: Secondary | ICD-10-CM | POA: Diagnosis not present

## 2021-09-30 DIAGNOSIS — E782 Mixed hyperlipidemia: Secondary | ICD-10-CM | POA: Diagnosis not present

## 2021-09-30 DIAGNOSIS — I10 Essential (primary) hypertension: Secondary | ICD-10-CM | POA: Diagnosis not present

## 2021-09-30 DIAGNOSIS — R42 Dizziness and giddiness: Secondary | ICD-10-CM | POA: Diagnosis not present

## 2021-09-30 DIAGNOSIS — N644 Mastodynia: Secondary | ICD-10-CM | POA: Diagnosis not present

## 2021-09-30 DIAGNOSIS — I2510 Atherosclerotic heart disease of native coronary artery without angina pectoris: Secondary | ICD-10-CM | POA: Diagnosis not present

## 2021-09-30 DIAGNOSIS — R001 Bradycardia, unspecified: Secondary | ICD-10-CM | POA: Diagnosis not present

## 2021-10-11 ENCOUNTER — Ambulatory Visit
Admission: RE | Admit: 2021-10-11 | Discharge: 2021-10-11 | Disposition: A | Discharge status: Home / Self Care | Payer: Medicare HMO | Source: Ambulatory Visit | Attending: Internal Medicine | Admitting: Internal Medicine

## 2021-10-11 ENCOUNTER — Other Ambulatory Visit: Payer: Self-pay

## 2021-10-11 DIAGNOSIS — Z1231 Encounter for screening mammogram for malignant neoplasm of breast: Secondary | ICD-10-CM

## 2021-10-15 DIAGNOSIS — M65849 Other synovitis and tenosynovitis, unspecified hand: Secondary | ICD-10-CM | POA: Diagnosis not present

## 2021-10-15 DIAGNOSIS — M67432 Ganglion, left wrist: Secondary | ICD-10-CM | POA: Diagnosis not present

## 2021-10-15 DIAGNOSIS — R2232 Localized swelling, mass and lump, left upper limb: Secondary | ICD-10-CM | POA: Diagnosis not present

## 2021-10-26 DIAGNOSIS — H353221 Exudative age-related macular degeneration, left eye, with active choroidal neovascularization: Secondary | ICD-10-CM | POA: Diagnosis not present

## 2021-11-04 DIAGNOSIS — I2510 Atherosclerotic heart disease of native coronary artery without angina pectoris: Secondary | ICD-10-CM | POA: Diagnosis not present

## 2021-11-04 DIAGNOSIS — I471 Supraventricular tachycardia: Secondary | ICD-10-CM | POA: Diagnosis not present

## 2021-11-04 DIAGNOSIS — E782 Mixed hyperlipidemia: Secondary | ICD-10-CM | POA: Diagnosis not present

## 2021-11-04 DIAGNOSIS — D472 Monoclonal gammopathy: Secondary | ICD-10-CM | POA: Diagnosis not present

## 2021-11-04 DIAGNOSIS — I10 Essential (primary) hypertension: Secondary | ICD-10-CM | POA: Diagnosis not present

## 2021-11-04 DIAGNOSIS — E785 Hyperlipidemia, unspecified: Secondary | ICD-10-CM | POA: Diagnosis not present

## 2021-11-04 DIAGNOSIS — Z79899 Other long term (current) drug therapy: Secondary | ICD-10-CM | POA: Diagnosis not present

## 2021-11-04 DIAGNOSIS — Z0000 Encounter for general adult medical examination without abnormal findings: Secondary | ICD-10-CM | POA: Diagnosis not present

## 2021-11-30 DIAGNOSIS — H353221 Exudative age-related macular degeneration, left eye, with active choroidal neovascularization: Secondary | ICD-10-CM | POA: Diagnosis not present

## 2021-11-30 DIAGNOSIS — H353211 Exudative age-related macular degeneration, right eye, with active choroidal neovascularization: Secondary | ICD-10-CM | POA: Diagnosis not present

## 2022-01-04 DIAGNOSIS — H353221 Exudative age-related macular degeneration, left eye, with active choroidal neovascularization: Secondary | ICD-10-CM | POA: Diagnosis not present

## 2022-01-11 DIAGNOSIS — H353221 Exudative age-related macular degeneration, left eye, with active choroidal neovascularization: Secondary | ICD-10-CM | POA: Diagnosis not present

## 2022-01-19 DIAGNOSIS — H401331 Pigmentary glaucoma, bilateral, mild stage: Secondary | ICD-10-CM | POA: Diagnosis not present

## 2022-01-31 DIAGNOSIS — H353211 Exudative age-related macular degeneration, right eye, with active choroidal neovascularization: Secondary | ICD-10-CM | POA: Diagnosis not present

## 2022-02-07 DIAGNOSIS — J40 Bronchitis, not specified as acute or chronic: Secondary | ICD-10-CM | POA: Diagnosis not present

## 2022-02-09 DIAGNOSIS — R053 Chronic cough: Secondary | ICD-10-CM | POA: Diagnosis not present

## 2022-02-09 DIAGNOSIS — I517 Cardiomegaly: Secondary | ICD-10-CM | POA: Diagnosis not present

## 2022-02-09 DIAGNOSIS — I2510 Atherosclerotic heart disease of native coronary artery without angina pectoris: Secondary | ICD-10-CM | POA: Diagnosis not present

## 2022-02-09 DIAGNOSIS — E782 Mixed hyperlipidemia: Secondary | ICD-10-CM | POA: Diagnosis not present

## 2022-02-09 DIAGNOSIS — R059 Cough, unspecified: Secondary | ICD-10-CM | POA: Diagnosis not present

## 2022-02-09 DIAGNOSIS — I10 Essential (primary) hypertension: Secondary | ICD-10-CM | POA: Diagnosis not present

## 2022-03-18 DIAGNOSIS — E782 Mixed hyperlipidemia: Secondary | ICD-10-CM | POA: Diagnosis not present

## 2022-03-18 DIAGNOSIS — R0609 Other forms of dyspnea: Secondary | ICD-10-CM | POA: Diagnosis not present

## 2022-03-18 DIAGNOSIS — R5383 Other fatigue: Secondary | ICD-10-CM | POA: Diagnosis not present

## 2022-03-18 DIAGNOSIS — R079 Chest pain, unspecified: Secondary | ICD-10-CM | POA: Diagnosis not present

## 2022-03-18 DIAGNOSIS — R5381 Other malaise: Secondary | ICD-10-CM | POA: Diagnosis not present

## 2022-03-18 DIAGNOSIS — I25118 Atherosclerotic heart disease of native coronary artery with other forms of angina pectoris: Secondary | ICD-10-CM | POA: Diagnosis not present

## 2022-03-18 DIAGNOSIS — R001 Bradycardia, unspecified: Secondary | ICD-10-CM | POA: Diagnosis not present

## 2022-03-18 DIAGNOSIS — I10 Essential (primary) hypertension: Secondary | ICD-10-CM | POA: Diagnosis not present

## 2022-03-18 DIAGNOSIS — I471 Supraventricular tachycardia: Secondary | ICD-10-CM | POA: Diagnosis not present

## 2022-03-29 DIAGNOSIS — H353221 Exudative age-related macular degeneration, left eye, with active choroidal neovascularization: Secondary | ICD-10-CM | POA: Diagnosis not present

## 2022-04-05 DIAGNOSIS — H353211 Exudative age-related macular degeneration, right eye, with active choroidal neovascularization: Secondary | ICD-10-CM | POA: Diagnosis not present

## 2022-04-06 DIAGNOSIS — R001 Bradycardia, unspecified: Secondary | ICD-10-CM | POA: Diagnosis not present

## 2022-04-09 DIAGNOSIS — J069 Acute upper respiratory infection, unspecified: Secondary | ICD-10-CM | POA: Diagnosis not present

## 2022-04-09 DIAGNOSIS — Z03818 Encounter for observation for suspected exposure to other biological agents ruled out: Secondary | ICD-10-CM | POA: Diagnosis not present

## 2022-04-09 DIAGNOSIS — M79604 Pain in right leg: Secondary | ICD-10-CM | POA: Diagnosis not present

## 2022-04-09 DIAGNOSIS — M79605 Pain in left leg: Secondary | ICD-10-CM | POA: Diagnosis not present

## 2022-04-14 DIAGNOSIS — R0609 Other forms of dyspnea: Secondary | ICD-10-CM | POA: Diagnosis not present

## 2022-04-25 DIAGNOSIS — R0609 Other forms of dyspnea: Secondary | ICD-10-CM | POA: Diagnosis not present

## 2022-04-25 DIAGNOSIS — R079 Chest pain, unspecified: Secondary | ICD-10-CM | POA: Diagnosis not present

## 2022-04-25 DIAGNOSIS — I10 Essential (primary) hypertension: Secondary | ICD-10-CM | POA: Diagnosis not present

## 2022-04-25 DIAGNOSIS — I700 Atherosclerosis of aorta: Secondary | ICD-10-CM | POA: Diagnosis not present

## 2022-04-25 DIAGNOSIS — I471 Supraventricular tachycardia: Secondary | ICD-10-CM | POA: Diagnosis not present

## 2022-04-25 DIAGNOSIS — E782 Mixed hyperlipidemia: Secondary | ICD-10-CM | POA: Diagnosis not present

## 2022-04-25 DIAGNOSIS — I25118 Atherosclerotic heart disease of native coronary artery with other forms of angina pectoris: Secondary | ICD-10-CM | POA: Diagnosis not present

## 2022-04-25 DIAGNOSIS — R001 Bradycardia, unspecified: Secondary | ICD-10-CM | POA: Diagnosis not present

## 2022-04-25 DIAGNOSIS — R5381 Other malaise: Secondary | ICD-10-CM | POA: Diagnosis not present

## 2022-04-29 ENCOUNTER — Emergency Department: Payer: Medicare HMO

## 2022-04-29 ENCOUNTER — Other Ambulatory Visit: Payer: Self-pay

## 2022-04-29 ENCOUNTER — Emergency Department
Admission: EM | Admit: 2022-04-29 | Discharge: 2022-04-29 | Disposition: A | Discharge status: Home / Self Care | Payer: Medicare HMO | Attending: Emergency Medicine | Admitting: Emergency Medicine

## 2022-04-29 DIAGNOSIS — I5030 Unspecified diastolic (congestive) heart failure: Secondary | ICD-10-CM | POA: Insufficient documentation

## 2022-04-29 DIAGNOSIS — I10 Essential (primary) hypertension: Secondary | ICD-10-CM

## 2022-04-29 DIAGNOSIS — R42 Dizziness and giddiness: Secondary | ICD-10-CM | POA: Diagnosis not present

## 2022-04-29 DIAGNOSIS — I110 Hypertensive heart disease with heart failure: Secondary | ICD-10-CM | POA: Diagnosis not present

## 2022-04-29 DIAGNOSIS — I2510 Atherosclerotic heart disease of native coronary artery without angina pectoris: Secondary | ICD-10-CM | POA: Diagnosis not present

## 2022-04-29 LAB — URINALYSIS, ROUTINE W REFLEX MICROSCOPIC
Bilirubin Urine: NEGATIVE
Glucose, UA: NEGATIVE mg/dL
Hgb urine dipstick: NEGATIVE
Ketones, ur: NEGATIVE mg/dL
Leukocytes,Ua: NEGATIVE
Nitrite: NEGATIVE
Protein, ur: NEGATIVE mg/dL
Specific Gravity, Urine: 1.002 — ABNORMAL LOW (ref 1.005–1.030)
pH: 7 (ref 5.0–8.0)

## 2022-04-29 LAB — CBC
HCT: 36.9 % (ref 36.0–46.0)
Hemoglobin: 11.8 g/dL — ABNORMAL LOW (ref 12.0–15.0)
MCH: 30.9 pg (ref 26.0–34.0)
MCHC: 32 g/dL (ref 30.0–36.0)
MCV: 96.6 fL (ref 80.0–100.0)
Platelets: 247 10*3/uL (ref 150–400)
RBC: 3.82 MIL/uL — ABNORMAL LOW (ref 3.87–5.11)
RDW: 13.3 % (ref 11.5–15.5)
WBC: 7.1 10*3/uL (ref 4.0–10.5)
nRBC: 0 % (ref 0.0–0.2)

## 2022-04-29 LAB — BASIC METABOLIC PANEL
Anion gap: 9 (ref 5–15)
BUN: 11 mg/dL (ref 8–23)
CO2: 27 mmol/L (ref 22–32)
Calcium: 9 mg/dL (ref 8.9–10.3)
Chloride: 103 mmol/L (ref 98–111)
Creatinine, Ser: 0.6 mg/dL (ref 0.44–1.00)
GFR, Estimated: >60 mL/min (ref >60)
Glucose, Bld: 98 mg/dL (ref 70–99)
Potassium: 4.3 mmol/L (ref 3.5–5.1)
Sodium: 139 mmol/L (ref 135–145)

## 2022-04-29 MED ORDER — LOSARTAN POTASSIUM 50 MG PO TABS
50.0000 mg | ORAL_TABLET | Freq: Once | ORAL | Status: AC
Start: 2022-04-29 — End: 2022-04-29
  Administered 2022-04-29: 50 mg via ORAL
  Filled 2022-04-29: qty 1

## 2022-04-29 MED ORDER — DILTIAZEM HCL 60 MG PO TABS
60.0000 mg | ORAL_TABLET | Freq: Once | ORAL | Status: AC
  Administered 2022-04-29: 60 mg via ORAL
  Filled 2022-04-29: qty 1

## 2022-04-29 NOTE — ED Notes (Signed)
Pt given snack and drink 

## 2022-04-29 NOTE — ED Provider Notes (Signed)
[facility] Provider Note    Event Date/Time   First MD Initiated Contact with Patient 04/29/22 1851     (approximate)   History   Dizziness   HPI  Mary Thornton is a 81 y.o. female who presents to the ED for evaluation of Dizziness   I review clinic visit from earlier today noted to be hypertensive and urged to come to the ED for evaluation in the setting of chronic dizziness. I reviewed cardiology clinic visit from 4 days ago.  She has a history of CAD, HTN, HLD, MGUS Barrett's esophagus. dCHF  Patient presents to the ED for evaluation of her blood pressure being high intermittently over the past couple days.  She reports for a week or 2 at least that she has felt a dizziness when she stands back up after kneeling down and gardening for a while.  Denies any dizziness at rest or vertigo, syncope or falls.  No fevers, illnesses, nausea, vomiting, chest pain, headache, dysuria or abdominal pain.  She reports concern that her blood pressure continues to wax and wane.  She reports she feels fine right now and has no complaints.  Physical Exam   Triage Vital Signs: ED Triage Vitals [04/29/22 1542]  Enc Vitals Group     BP (!) 159/97     Pulse Rate 72     Resp 18     Temp 98.9 F (37.2 C)     Temp Source Oral     SpO2 98 %     Weight 107 lb 12.8 oz (48.9 kg)     Height '5\' 3"'$  (1.6 m)     Head Circumference      Peak Flow      Pain Score 0     Pain Loc      Pain Edu?      Excl. in [place]?     Most recent vital signs: Vitals:   04/29/22 1944 04/29/22 2059  BP: (!) 177/79 134/68  Pulse: 63 64  Resp: 18 16  Temp:    SpO2: 99% 98%    General: Awake, no distress.  Pleasant and conversational CV:  Good peripheral perfusion. RRR Resp:  Normal effort. CTAB Abd:  No distention.  Soft and benign MSK:  No deformity noted.  Neuro:  No focal deficits appreciated. Cranial nerves II through XII intact 5/5 strength and sensation in all 4  extremities Other:     ED Results / Procedures / Treatments   Labs (all labs ordered are listed, but only abnormal results are displayed) Labs Reviewed  CBC - Abnormal; Notable for the following components:      Result Value   RBC 3.82 (*)    Hemoglobin 11.8 (*)    All other components within normal limits  URINALYSIS, ROUTINE W REFLEX MICROSCOPIC - Abnormal; Notable for the following components:   Color, Urine COLORLESS (*)    APPearance CLEAR (*)    Specific Gravity, Urine 1.002 (*)    All other components within normal limits  BASIC METABOLIC PANEL  CBG MONITORING, ED    EKG Sinus rhythm with a rate of 67 bpm.  Leftward axis.  Left bundle morphology without STEMI.  RADIOLOGY CT head interpreted by me without evidence of acute intracranial pathology  Official radiology report(s): CT HEAD WO CONTRAST (5MM)  Result Date: 04/29/2022 CLINICAL DATA:  Hypertension dizzy EXAM: CT HEAD WITHOUT CONTRAST TECHNIQUE: Contiguous axial images were obtained from the base of the skull through  the vertex without intravenous contrast. RADIATION DOSE REDUCTION: This exam was performed according to the departmental dose-optimization program which includes automated exposure control, adjustment of the mA and/or kV according to patient size and/or use of iterative reconstruction technique. COMPARISON:  CT brain 05/30/2018, MRI 07/13/2018 FINDINGS: Brain: No acute territorial infarction, hemorrhage or intracranial mass. Mild atrophy. Mild mild white matter hypodensity consistent with chronic small vessel ischemic change. The ventricles are nonenlarged Vascular: No hyperdense vessels. Scattered carotid vascular calcification Skull: Normal. Negative for fracture or focal lesion. Sinuses/Orbits: No acute finding. Other: None IMPRESSION: 1. No CT evidence for acute intracranial abnormality. 2. Atrophy and chronic small vessel ischemic changes of the white matter Electronically Signed   By: Donavan Foil M.D.    On: 04/29/2022 19:39    PROCEDURES and INTERVENTIONS:  .1-3 Lead EKG Interpretation  Performed by: Vladimir Crofts, MD Authorized by: Vladimir Crofts, MD     Interpretation: normal     ECG rate:  60   Rhythm: sinus rhythm     Ectopy: none     Conduction: normal     Medications  losartan (COZAAR) tablet 50 mg (50 mg Oral Given 04/29/22 1943)  diltiazem (CARDIZEM) tablet 60 mg (60 mg Oral Given 04/29/22 1943)     IMPRESSION / MDM / ASSESSMENT AND PLAN / ED COURSE  I reviewed the triage vital signs and the nursing notes.  Differential diagnosis includes, but is not limited to, hypertensive emergency, stroke, anxiety, ACS, anemia, cystitis, sepsis  {Patient presents with symptoms of an acute illness or injury that is potentially life-threatening.  81 year old female presents to the ED with positional dizziness in the setting of asymptomatic hypertension.  She is asymptomatic at rest and has reassuring examination.  She looks well without evidence of neurologic or vascular deficits.  Waxing waning hypertension noted and she has no back scamming symptoms alongside this.  CBC and BMP are reassuring.  No signs of endorgan damage.  UA without infectious features.  CT head without evidence of ICH or CVA.  Patient looks well and has evidence of asymptomatic hypertension and subacute-chronic positional presyncope with standing up after kneeling for long periods of time.  I will see any evidence of endorgan damage or pathology related to her hypertension acutely.  I see no barriers to outpatient management.  We discussed return precautions.  Clinical Course as of 04/29/22 2111  Fri Apr 29, 2022  2043 reassessed [DS]    Clinical Course User Index [DS] Vladimir Crofts, MD     FINAL CLINICAL IMPRESSION(S) / ED DIAGNOSES   Final diagnoses:  Asymptomatic hypertension  Dizziness on standing     Rx / DC Orders   ED Discharge Orders     None        Note:  This document was prepared using  Dragon voice recognition software and may include unintentional dictation errors.   Vladimir Crofts, MD 04/29/22 2111

## 2022-04-29 NOTE — ED Provider Triage Note (Signed)
Emergency Medicine Provider Triage Evaluation Note  Mary Thornton , a 81 y.o. female  was evaluated in triage.  Pt complains of dizziness for the past week. She was sent from [facility] for further evaluation. She reports hypertension yesterday.   Physical Exam  BP (!) 159/97 (BP Location: Left Arm)   Pulse 72   Temp 98.9 F (37.2 C) (Oral)   Resp 18   Ht '5\' 3"'$  (1.6 m)   Wt 48.9 kg   SpO2 98%   BMI 19.10 kg/m  Gen:   Awake, no distress   Resp:  Normal effort  MSK:   Moves extremities without difficulty  Other:    Medical Decision Making  Medically screening exam initiated at 3:57 PM.  Appropriate orders placed.  Mary Thornton was informed that the remainder of the evaluation will be completed by another provider, this initial triage assessment does not replace that evaluation, and the importance of remaining in the ED until their evaluation is complete.    Victorino Dike, FNP 04/29/22 1600

## 2022-04-29 NOTE — ED Triage Notes (Signed)
Patient came from [facility]. C/o dizziness and unsteady. [place] vitals 159/80 blood pressure

## 2022-04-29 NOTE — ED Notes (Addendum)
Pt to ED  Pt states she went to [facility] department yesterday for HTN, they checked BP 190/40, 190/76. Pt takes lopressor for BP, states she hasnt taken afternoon dose. Pt denies headaches, pt states she gets dizzy when she bends over. Denies Recent falls, denies SOB, CP.   Pt is A&ox4

## 2022-04-29 NOTE — ED Triage Notes (Signed)
Pt to ED via [facility] for dizziness and unsteady. Pt reports high BP readings yesterday at FD with systolic in 742V. Pt reports dizziness increased when bending over. Pt states med compliant and no missed doses.

## 2022-05-06 DIAGNOSIS — I10 Essential (primary) hypertension: Secondary | ICD-10-CM | POA: Diagnosis not present

## 2022-05-06 DIAGNOSIS — I25118 Atherosclerotic heart disease of native coronary artery with other forms of angina pectoris: Secondary | ICD-10-CM | POA: Diagnosis not present

## 2022-05-10 DIAGNOSIS — H5711 Ocular pain, right eye: Secondary | ICD-10-CM | POA: Diagnosis not present

## 2022-05-18 DIAGNOSIS — H401131 Primary open-angle glaucoma, bilateral, mild stage: Secondary | ICD-10-CM | POA: Diagnosis not present

## 2022-05-20 DIAGNOSIS — K229 Disease of esophagus, unspecified: Secondary | ICD-10-CM | POA: Diagnosis not present

## 2022-05-20 DIAGNOSIS — Z79899 Other long term (current) drug therapy: Secondary | ICD-10-CM | POA: Diagnosis not present

## 2022-05-20 DIAGNOSIS — I2510 Atherosclerotic heart disease of native coronary artery without angina pectoris: Secondary | ICD-10-CM | POA: Diagnosis not present

## 2022-05-20 DIAGNOSIS — I10 Essential (primary) hypertension: Secondary | ICD-10-CM | POA: Diagnosis not present

## 2022-05-20 DIAGNOSIS — E782 Mixed hyperlipidemia: Secondary | ICD-10-CM | POA: Diagnosis not present

## 2022-05-20 DIAGNOSIS — R252 Cramp and spasm: Secondary | ICD-10-CM | POA: Diagnosis not present

## 2022-05-24 DIAGNOSIS — H353211 Exudative age-related macular degeneration, right eye, with active choroidal neovascularization: Secondary | ICD-10-CM | POA: Diagnosis not present

## 2022-05-27 ENCOUNTER — Other Ambulatory Visit: Payer: Self-pay

## 2022-05-27 NOTE — Patient Outreach (Signed)
[facility]) Care Management  05/27/2022  Mary Thornton [birth year] [id number]   Telephone Screen    Outreach call to patient to introduce [facility] services and assess care needs as part of benefit of PCP office and insurance plan. No answer after multiple rings and unable to leave message.      Plan: RN CM will make outreach attempt to patient within 4 business days. RN CM will send unsuccessful outreach letter to patient.   Enzo Montgomery, RN,BSN,CCM [place] Management Telephonic Care Management Coordinator Direct Phone: [phone] Toll Free: [phone] Fax: [phone]

## 2022-05-30 ENCOUNTER — Other Ambulatory Visit: Payer: Self-pay

## 2022-05-30 NOTE — Patient Outreach (Signed)
[facility]) Care Management  05/30/2022  Mary Thornton 06/16/1941 [id number]   Telephone Screen       Outreach call to patient to introduce [facility] services and assess care needs as part of benefit of PCP office and insurance plan.. No answer. RN CM left HIPAA compliant voicemail message along with contact info.       Plan: RN CM will make outreach attempt to patient within 4 business days.  Enzo Montgomery, RN,BSN,CCM [place] Management Telephonic Care Management Coordinator Direct Phone: [phone] Toll Free: [phone] Fax: [phone]

## 2022-06-01 ENCOUNTER — Other Ambulatory Visit: Payer: Self-pay

## 2022-06-01 NOTE — Patient Outreach (Signed)
[facility]) Care Management  06/01/2022  Mary Thornton 16-Feb-1941 [id number]     Telephone Screen       Outreach call to patient to introduce [facility] services and assess care needs as part of benefit of PCP office and insurance plan.No answer. RN CM left HIPAA compliant voicemail message along with contact info.       Plan: RN CM will make outreach attempt to patient within 3-4 wks if no response from letter mailed to patient.  Enzo Montgomery, RN,BSN,CCM [place] Management Telephonic Care Management Coordinator Direct Phone: [phone] Toll Free: [phone] Fax: [phone]

## 2022-06-02 ENCOUNTER — Ambulatory Visit: Payer: Self-pay

## 2022-06-02 NOTE — Patient Outreach (Signed)
  Care Coordination   06/02/2022 Name: Rosellen Lichtenberger MRN: [id number] DOB: 1941/02/08   Care Coordination Outreach Attempts:  A third unsuccessful outreach was attempted today to offer the patient with information about available care coordination services as a benefit of their health plan.   Follow Up Plan:  No further outreach attempts will be made at this time. We have been unable to contact the patient to offer or enroll patient in care coordination services  Encounter Outcome:  No Answer  Care Coordination Interventions Activated:  No   Care Coordination Interventions:  No, not indicated    Noreene Larsson RN, MSN, [place] Coordinator [facility] [employer] Mobile: [phone]

## 2022-06-17 DIAGNOSIS — H353221 Exudative age-related macular degeneration, left eye, with active choroidal neovascularization: Secondary | ICD-10-CM | POA: Diagnosis not present

## 2022-06-22 ENCOUNTER — Other Ambulatory Visit: Payer: Self-pay

## 2022-06-22 NOTE — Patient Outreach (Signed)
[facility]) Care Management  06/22/2022  Mary Thornton 18-Feb-1941 [id number]   Telephone Screen       Multiple unsuccessful attempts to outreach patient to introduce [facility] services and assess care needs as part of benefit of PCP office and insurance plan.    Plan: RN CM will close case.  Enzo Montgomery, RN,BSN,CCM [place] Management Telephonic Care Management Coordinator Direct Phone: [phone] Toll Free: [phone] Fax: [phone]

## 2022-06-23 DIAGNOSIS — I10 Essential (primary) hypertension: Secondary | ICD-10-CM | POA: Diagnosis not present

## 2022-06-23 DIAGNOSIS — H60501 Unspecified acute noninfective otitis externa, right ear: Secondary | ICD-10-CM | POA: Diagnosis not present

## 2022-06-23 DIAGNOSIS — H6121 Impacted cerumen, right ear: Secondary | ICD-10-CM | POA: Diagnosis not present

## 2022-06-23 DIAGNOSIS — H10501 Unspecified blepharoconjunctivitis, right eye: Secondary | ICD-10-CM | POA: Diagnosis not present

## 2022-06-29 DIAGNOSIS — H6980 Other specified disorders of Eustachian tube, unspecified ear: Secondary | ICD-10-CM | POA: Diagnosis not present

## 2022-06-29 DIAGNOSIS — H9201 Otalgia, right ear: Secondary | ICD-10-CM | POA: Diagnosis not present

## 2022-06-30 DIAGNOSIS — H401131 Primary open-angle glaucoma, bilateral, mild stage: Secondary | ICD-10-CM | POA: Diagnosis not present

## 2022-07-08 ENCOUNTER — Encounter: Payer: Self-pay | Admitting: Family Medicine

## 2022-07-08 ENCOUNTER — Emergency Department: Payer: Medicare HMO

## 2022-07-08 ENCOUNTER — Observation Stay
Admission: EM | Admit: 2022-07-08 | Discharge: 2022-07-13 | Disposition: A | Discharge status: Skilled Nursing Facility | Payer: Medicare HMO | Attending: Internal Medicine | Admitting: Internal Medicine

## 2022-07-08 ENCOUNTER — Other Ambulatory Visit: Payer: Self-pay

## 2022-07-08 DIAGNOSIS — R778 Other specified abnormalities of plasma proteins: Secondary | ICD-10-CM | POA: Diagnosis not present

## 2022-07-08 DIAGNOSIS — I471 Supraventricular tachycardia: Secondary | ICD-10-CM | POA: Diagnosis not present

## 2022-07-08 DIAGNOSIS — Z20822 Contact with and (suspected) exposure to covid-19: Secondary | ICD-10-CM | POA: Insufficient documentation

## 2022-07-08 DIAGNOSIS — R0902 Hypoxemia: Secondary | ICD-10-CM | POA: Diagnosis not present

## 2022-07-08 DIAGNOSIS — Z7982 Long term (current) use of aspirin: Secondary | ICD-10-CM | POA: Insufficient documentation

## 2022-07-08 DIAGNOSIS — I4891 Unspecified atrial fibrillation: Secondary | ICD-10-CM | POA: Diagnosis not present

## 2022-07-08 DIAGNOSIS — E785 Hyperlipidemia, unspecified: Secondary | ICD-10-CM | POA: Diagnosis not present

## 2022-07-08 DIAGNOSIS — R000 Tachycardia, unspecified: Secondary | ICD-10-CM | POA: Diagnosis not present

## 2022-07-08 DIAGNOSIS — I248 Other forms of acute ischemic heart disease: Secondary | ICD-10-CM | POA: Insufficient documentation

## 2022-07-08 DIAGNOSIS — I6381 Other cerebral infarction due to occlusion or stenosis of small artery: Secondary | ICD-10-CM | POA: Diagnosis not present

## 2022-07-08 DIAGNOSIS — I2510 Atherosclerotic heart disease of native coronary artery without angina pectoris: Secondary | ICD-10-CM | POA: Diagnosis not present

## 2022-07-08 DIAGNOSIS — I110 Hypertensive heart disease with heart failure: Secondary | ICD-10-CM | POA: Diagnosis not present

## 2022-07-08 DIAGNOSIS — R0689 Other abnormalities of breathing: Secondary | ICD-10-CM | POA: Diagnosis not present

## 2022-07-08 DIAGNOSIS — Z79899 Other long term (current) drug therapy: Secondary | ICD-10-CM | POA: Insufficient documentation

## 2022-07-08 DIAGNOSIS — I5030 Unspecified diastolic (congestive) heart failure: Secondary | ICD-10-CM | POA: Insufficient documentation

## 2022-07-08 DIAGNOSIS — J45909 Unspecified asthma, uncomplicated: Secondary | ICD-10-CM | POA: Insufficient documentation

## 2022-07-08 DIAGNOSIS — R001 Bradycardia, unspecified: Secondary | ICD-10-CM | POA: Diagnosis not present

## 2022-07-08 DIAGNOSIS — I10 Essential (primary) hypertension: Secondary | ICD-10-CM

## 2022-07-08 DIAGNOSIS — Z96652 Presence of left artificial knee joint: Secondary | ICD-10-CM | POA: Diagnosis not present

## 2022-07-08 DIAGNOSIS — R4182 Altered mental status, unspecified: Secondary | ICD-10-CM | POA: Diagnosis not present

## 2022-07-08 DIAGNOSIS — K219 Gastro-esophageal reflux disease without esophagitis: Secondary | ICD-10-CM

## 2022-07-08 LAB — PROTIME-INR
INR: 1.1 (ref 0.8–1.2)
Prothrombin Time: 14 seconds (ref 11.4–15.2)

## 2022-07-08 LAB — CBC WITH DIFFERENTIAL/PLATELET
Abs Immature Granulocytes: 0.03 10*3/uL (ref 0.00–0.07)
Basophils Absolute: 0 10*3/uL (ref 0.0–0.1)
Basophils Relative: 0 %
Eosinophils Absolute: 0 10*3/uL (ref 0.0–0.5)
Eosinophils Relative: 0 %
HCT: 39 % (ref 36.0–46.0)
Hemoglobin: 13 g/dL (ref 12.0–15.0)
Immature Granulocytes: 0 %
Lymphocytes Relative: 11 %
Lymphs Abs: 1.1 10*3/uL (ref 0.7–4.0)
MCH: 31.9 pg (ref 26.0–34.0)
MCHC: 33.3 g/dL (ref 30.0–36.0)
MCV: 95.6 fL (ref 80.0–100.0)
Monocytes Absolute: 1 10*3/uL (ref 0.1–1.0)
Monocytes Relative: 10 %
Neutro Abs: 7.6 10*3/uL (ref 1.7–7.7)
Neutrophils Relative %: 79 %
Platelets: 319 10*3/uL (ref 150–400)
RBC: 4.08 MIL/uL (ref 3.87–5.11)
RDW: 13.2 % (ref 11.5–15.5)
WBC: 9.7 10*3/uL (ref 4.0–10.5)
nRBC: 0 % (ref 0.0–0.2)

## 2022-07-08 LAB — URINALYSIS, COMPLETE (UACMP) WITH MICROSCOPIC
Bacteria, UA: NONE SEEN
Bilirubin Urine: NEGATIVE
Glucose, UA: NEGATIVE mg/dL
Hgb urine dipstick: NEGATIVE
Ketones, ur: 5 mg/dL — AB
Leukocytes,Ua: NEGATIVE
Nitrite: NEGATIVE
Protein, ur: NEGATIVE mg/dL
Specific Gravity, Urine: 1.008 (ref 1.005–1.030)
pH: 7 (ref 5.0–8.0)

## 2022-07-08 LAB — TROPONIN I (HIGH SENSITIVITY)
Troponin I (High Sensitivity): 22 ng/L — ABNORMAL HIGH (ref <18)
Troponin I (High Sensitivity): 31 ng/L — ABNORMAL HIGH (ref <18)

## 2022-07-08 LAB — COMPREHENSIVE METABOLIC PANEL
ALT: 34 U/L (ref 0–44)
AST: 32 U/L (ref 15–41)
Albumin: 4.2 g/dL (ref 3.5–5.0)
Alkaline Phosphatase: 69 U/L (ref 38–126)
Anion gap: 9 (ref 5–15)
BUN: 14 mg/dL (ref 8–23)
CO2: 24 mmol/L (ref 22–32)
Calcium: 8.3 mg/dL — ABNORMAL LOW (ref 8.9–10.3)
Chloride: 103 mmol/L (ref 98–111)
Creatinine, Ser: 0.62 mg/dL (ref 0.44–1.00)
GFR, Estimated: >60 mL/min (ref >60)
Glucose, Bld: 120 mg/dL — ABNORMAL HIGH (ref 70–99)
Potassium: 4.6 mmol/L (ref 3.5–5.1)
Sodium: 136 mmol/L (ref 135–145)
Total Bilirubin: 0.7 mg/dL (ref 0.3–1.2)
Total Protein: 6.3 g/dL — ABNORMAL LOW (ref 6.5–8.1)

## 2022-07-08 LAB — T4, FREE: Free T4: 1.55 ng/dL — ABNORMAL HIGH (ref 0.61–1.12)

## 2022-07-08 LAB — LACTIC ACID, PLASMA
Lactic Acid, Venous: 1.1 mmol/L (ref 0.5–1.9)
Lactic Acid, Venous: 1.1 mmol/L (ref 0.5–1.9)

## 2022-07-08 LAB — TSH: TSH: 0.696 u[IU]/mL (ref 0.350–4.500)

## 2022-07-08 LAB — RESP PANEL BY RT-PCR (FLU A&B, COVID) ARPGX2
Influenza A by PCR: NEGATIVE
Influenza B by PCR: NEGATIVE
SARS Coronavirus 2 by RT PCR: NEGATIVE

## 2022-07-08 LAB — APTT: aPTT: 41 seconds — ABNORMAL HIGH (ref 24–36)

## 2022-07-08 LAB — BRAIN NATRIURETIC PEPTIDE: B Natriuretic Peptide: 144.5 pg/mL — ABNORMAL HIGH (ref 0.0–100.0)

## 2022-07-08 MED ORDER — SODIUM CHLORIDE 0.9 % IV SOLN
2.0000 g | Freq: Once | INTRAVENOUS | Status: AC
  Administered 2022-07-08: 2 g via INTRAVENOUS
  Filled 2022-07-08: qty 12.5

## 2022-07-08 MED ORDER — ACETAMINOPHEN 650 MG RE SUPP
650.0000 mg | Freq: Four times a day (QID) | RECTAL | Status: DC | PRN
Start: 2022-07-08 — End: 2022-07-13

## 2022-07-08 MED ORDER — MELATONIN 5 MG PO TABS: 5.0000 mg | ORAL_TABLET | Freq: Once | ORAL | Status: DC

## 2022-07-08 MED ORDER — ACETAMINOPHEN 325 MG PO TABS
650.0000 mg | ORAL_TABLET | Freq: Four times a day (QID) | ORAL | Status: DC | PRN
Start: 2022-07-08 — End: 2022-07-13
  Administered 2022-07-11: 650 mg via ORAL
  Filled 2022-07-08: qty 2

## 2022-07-08 MED ORDER — ONDANSETRON HCL 4 MG/2ML IJ SOLN
4.0000 mg | Freq: Four times a day (QID) | INTRAMUSCULAR | Status: DC | PRN
Start: 2022-07-08 — End: 2022-07-13

## 2022-07-08 MED ORDER — ADULT MULTIVITAMIN W/MINERALS CH
1.0000 | ORAL_TABLET | Freq: Every day | ORAL | Status: DC
  Administered 2022-07-08 – 2022-07-13 (×6): 1 via ORAL
  Filled 2022-07-08 (×6): qty 1

## 2022-07-08 MED ORDER — LORATADINE 10 MG PO TABS
10.0000 mg | ORAL_TABLET | Freq: Every day | ORAL | Status: DC
  Administered 2022-07-09 – 2022-07-13 (×5): 10 mg via ORAL
  Filled 2022-07-08 (×5): qty 1

## 2022-07-08 MED ORDER — TIZANIDINE HCL 4 MG PO TABS: 4.0000 mg | ORAL_TABLET | Freq: Four times a day (QID) | ORAL | Status: DC | PRN

## 2022-07-08 MED ORDER — MAGNESIUM HYDROXIDE 400 MG/5ML PO SUSP
30.0000 mL | Freq: Every day | ORAL | Status: DC | PRN
Start: 2022-07-08 — End: 2022-07-13

## 2022-07-08 MED ORDER — NEOMYCIN-POLYMYXIN-DEXAMETH 3.5-10000-0.1 OP SUSP
1.0000 [drp] | Freq: Three times a day (TID) | OPHTHALMIC | Status: DC
Start: 2022-07-08 — End: 2022-07-08

## 2022-07-08 MED ORDER — MIRTAZAPINE 15 MG PO TABS
15.0000 mg | ORAL_TABLET | Freq: Every day | ORAL | Status: DC
  Administered 2022-07-08 – 2022-07-12 (×5): 15 mg via ORAL
  Filled 2022-07-08 (×5): qty 1

## 2022-07-08 MED ORDER — METOPROLOL TARTRATE 25 MG PO TABS
12.5000 mg | ORAL_TABLET | Freq: Two times a day (BID) | ORAL | Status: DC
  Administered 2022-07-08 – 2022-07-11 (×6): 12.5 mg via ORAL
  Filled 2022-07-08 (×6): qty 1

## 2022-07-08 MED ORDER — OFLOXACIN 0.3 % OP SOLN: 5.0000 [drp] | Freq: Every day | OPHTHALMIC | Status: DC

## 2022-07-08 MED ORDER — ASPIRIN 81 MG PO TBEC
81.0000 mg | DELAYED_RELEASE_TABLET | Freq: Every day | ORAL | Status: DC
  Administered 2022-07-09 – 2022-07-13 (×5): 81 mg via ORAL
  Filled 2022-07-08 (×5): qty 1

## 2022-07-08 MED ORDER — AZELASTINE HCL 0.1 % NA SOLN
1.0000 | Freq: Two times a day (BID) | NASAL | Status: DC
  Administered 2022-07-08 – 2022-07-11 (×5): 1 via NASAL
  Filled 2022-07-08 (×2): qty 30

## 2022-07-08 MED ORDER — OCUVITE-LUTEIN PO CAPS
1.0000 | ORAL_CAPSULE | Freq: Two times a day (BID) | ORAL | Status: DC
  Administered 2022-07-09 – 2022-07-13 (×8): 1 via ORAL
  Filled 2022-07-08 (×10): qty 1

## 2022-07-08 MED ORDER — SODIUM CHLORIDE 0.9 % IV SOLN: INTRAVENOUS | Status: DC

## 2022-07-08 MED ORDER — BACLOFEN 10 MG PO TABS
20.0000 mg | ORAL_TABLET | Freq: Two times a day (BID) | ORAL | Status: DC
  Administered 2022-07-08 – 2022-07-13 (×10): 20 mg via ORAL
  Filled 2022-07-08 (×11): qty 2

## 2022-07-08 MED ORDER — ATORVASTATIN CALCIUM 20 MG PO TABS
40.0000 mg | ORAL_TABLET | Freq: Every day | ORAL | Status: DC
  Administered 2022-07-09: 40 mg via ORAL
  Filled 2022-07-08: qty 2

## 2022-07-08 MED ORDER — DILTIAZEM HCL-DEXTROSE 125-5 MG/125ML-% IV SOLN (PREMIX): 5.0000 mg/h | INTRAVENOUS | Status: DC

## 2022-07-08 MED ORDER — DILTIAZEM HCL-DEXTROSE 125-5 MG/125ML-% IV SOLN (PREMIX)
5.0000 mg/h | INTRAVENOUS | Status: DC
  Administered 2022-07-08 – 2022-07-09 (×2): 5 mg/h via INTRAVENOUS
  Filled 2022-07-08 (×3): qty 125

## 2022-07-08 MED ORDER — TIMOLOL MALEATE 0.25 % OP SOLN
1.0000 [drp] | Freq: Two times a day (BID) | OPHTHALMIC | Status: DC
  Administered 2022-07-09 – 2022-07-13 (×10): 1 [drp] via OPHTHALMIC
  Filled 2022-07-08 (×2): qty 5

## 2022-07-08 MED ORDER — APIXABAN 2.5 MG PO TABS
2.5000 mg | ORAL_TABLET | Freq: Two times a day (BID) | ORAL | Status: DC
  Administered 2022-07-08 – 2022-07-13 (×10): 2.5 mg via ORAL
  Filled 2022-07-08 (×11): qty 1

## 2022-07-08 MED ORDER — VANCOMYCIN HCL IN DEXTROSE 1-5 GM/200ML-% IV SOLN
1000.0000 mg | Freq: Once | INTRAVENOUS | Status: AC
  Administered 2022-07-08: 1000 mg via INTRAVENOUS
  Filled 2022-07-08: qty 200

## 2022-07-08 MED ORDER — DILTIAZEM HCL 25 MG/5ML IV SOLN
5.0000 mg | Freq: Once | INTRAVENOUS | Status: AC
  Administered 2022-07-08: 5 mg via INTRAVENOUS
  Filled 2022-07-08: qty 5

## 2022-07-08 MED ORDER — PANTOPRAZOLE SODIUM 40 MG PO TBEC
40.0000 mg | DELAYED_RELEASE_TABLET | Freq: Every day | ORAL | Status: DC
  Administered 2022-07-09 – 2022-07-13 (×5): 40 mg via ORAL
  Filled 2022-07-08 (×4): qty 1

## 2022-07-08 MED ORDER — LOSARTAN POTASSIUM 50 MG PO TABS
50.0000 mg | ORAL_TABLET | Freq: Two times a day (BID) | ORAL | Status: DC
  Administered 2022-07-08 – 2022-07-11 (×6): 50 mg via ORAL
  Filled 2022-07-08 (×7): qty 1

## 2022-07-08 MED ORDER — OYSTER SHELL CALCIUM/D3 500-5 MG-MCG PO TABS
1.0000 | ORAL_TABLET | Freq: Every day | ORAL | Status: DC
  Administered 2022-07-08 – 2022-07-13 (×6): 1 via ORAL
  Filled 2022-07-08 (×6): qty 1

## 2022-07-08 MED ORDER — ONDANSETRON HCL 4 MG PO TABS
4.0000 mg | ORAL_TABLET | Freq: Four times a day (QID) | ORAL | Status: DC | PRN
Start: 2022-07-08 — End: 2022-07-13

## 2022-07-08 MED ORDER — ERYTHROMYCIN 5 MG/GM OP OINT
1.0000 | TOPICAL_OINTMENT | Freq: Three times a day (TID) | OPHTHALMIC | Status: DC
  Administered 2022-07-09 – 2022-07-11 (×6): 1 via OPHTHALMIC
  Filled 2022-07-08: qty 1

## 2022-07-08 MED ORDER — TRAZODONE HCL 50 MG PO TABS
25.0000 mg | ORAL_TABLET | Freq: Every evening | ORAL | Status: DC | PRN
Start: 2022-07-08 — End: 2022-07-13
  Administered 2022-07-09 (×2): 25 mg via ORAL
  Filled 2022-07-08 (×2): qty 1

## 2022-07-08 MED ORDER — SODIUM CHLORIDE 0.9 % IV BOLUS (SEPSIS)
1000.0000 mL | Freq: Once | INTRAVENOUS | Status: AC
  Administered 2022-07-08: 1000 mL via INTRAVENOUS

## 2022-07-08 MED ORDER — MONTELUKAST SODIUM 10 MG PO TABS
10.0000 mg | ORAL_TABLET | Freq: Every day | ORAL | Status: DC
  Administered 2022-07-09 – 2022-07-13 (×5): 10 mg via ORAL
  Filled 2022-07-08 (×5): qty 1

## 2022-07-08 NOTE — Assessment & Plan Note (Signed)
-   We will continue her antihypertensives. 

## 2022-07-08 NOTE — Assessment & Plan Note (Signed)
-   We will continue PPI therapy 

## 2022-07-08 NOTE — ED Triage Notes (Signed)
Pt presents to the ED due to AMS and weakness that's been going on for 2 weeks. Pt has hx afib.

## 2022-07-08 NOTE — Consult Note (Signed)
PHARMACY -  BRIEF ANTIBIOTIC NOTE   Pharmacy has received consult(s) for Vancomycin and Cefepime from an ED provider.  The patient's profile has been reviewed for ht/wt/allergies/indication/available labs.    One time order(s) placed for Vancomycin and Cefepime   Further antibiotics/pharmacy consults should be ordered by admitting physician if indicated.                       Thank you,  Gretel Acre, PharmD PGY1 Pharmacy Resident 07/08/2022 4:58 PM

## 2022-07-08 NOTE — Sepsis Progress Note (Signed)
eLink is following this Code Sepsis. °

## 2022-07-08 NOTE — H&P (Addendum)
[place]   PATIENT NAME: Mary Thornton    MR#:  [id number]  DATE OF BIRTH:  1941/01/25  DATE OF ADMISSION:  07/08/2022  PRIMARY CARE PHYSICIAN: Idelle Crouch, MD   Patient is coming from: Home  REQUESTING/REFERRING PHYSICIAN: Ane Payment, MD  CHIEF COMPLAINT:   Chief Complaint  Patient presents with   Altered Mental Status    HISTORY OF PRESENT ILLNESS:  Mary Thornton is a 81 y.o. Caucasian female with medical history significant for asthma, osteoarthritis, diverticulosis, GERD, hypertension, dyslipidemia and PSVT as well as GERD, who presented to the emergency room with acute onset of generalized weakness and altered mental status over the last week.  Per her son over the last 3 days she has not been very conversant.  She would stay home and not go outside as her usual.  He felt she was failing to thrive.  She denies any chest pain or palpitations.  No cough or wheezing or hemoptysis.  No leg pain or edema or recent travels or surgeries.  No paresthesias or focal muscle weakness.  No dysuria no hematuria, urgency or frequency or flank pain. ED Course: When she came to the ER, BP was 132/91 with heart rate of 140 and atrial fibrillation and otherwise normal vital signs.  Labs revealed unremarkable CMP except for total protein 6.3.  High sensitive troponin I was 22.  BNP was 144.5.  Lactic acid was 1.1 and CBC was within normal.  TSH came back 0.69 and free T4 was 1.55.  Influenza antigens and COVID-19 PCR came back negative. EKG as reviewed by me : Initial EKG showed atrial fibrillation with rapid ventricular response of 149 with LVH, LAD and lateral ST T changes.  Repeat EKG after conversion showed normal sinus rhythm with a rate of 73 with LAD and moderate voltage criteria for LVH. Imaging: Portable chest ray showed no acute cardiopulmonary disease.Noncontrast head CT scan revealed no acute intracranial abnormalities.  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  The patient was given 5 mg of IV Cardizem bolus followed by IV Cardizem drip.  She was also given 1 L bolus of IV normal saline and initially IV cefepime and vancomycin for concern about sepsis.  She will be admitted to a medical unit bed for further evaluation and management. PAST MEDICAL HISTORY:   Past Medical History:  Diagnosis Date   Arthritis    Asthma    Barrett esophagus    Complication of anesthesia    very hard to wake up. feels like she had too much medicine. 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 Herbert Deaner   COLONOSCOPY WITH PROPOFOL N/A 06/23/2015   Procedure: COLONOSCOPY WITH PROPOFOL;  Surgeon: [name], MD;  Location: [facility] ENDOSCOPY;  Service: Endoscopy;  Laterality: N/A;   COLONOSCOPY WITH PROPOFOL N/A 08/11/2020   Procedure: COLONOSCOPY WITH PROPOFOL;  Surgeon: Lesly Rubenstein, MD;  Location: [facility] ENDOSCOPY;  Service: Endoscopy;  Laterality: N/A;   ESOPHAGOGASTRODUODENOSCOPY (EGD) WITH PROPOFOL N/A 06/23/2015   Procedure: ESOPHAGOGASTRODUODENOSCOPY (EGD) WITH PROPOFOL;  Surgeon: [name], MD;  Location: [facility] ENDOSCOPY;  Service: Endoscopy;   Laterality: N/A;   ESOPHAGOGASTRODUODENOSCOPY (EGD) WITH PROPOFOL N/A 11/28/2017   Procedure: ESOPHAGOGASTRODUODENOSCOPY (EGD) WITH PROPOFOL;  Surgeon: [name], MD;  Location: [facility] ENDOSCOPY;  Service: Endoscopy;  Laterality: N/A;   ESOPHAGOGASTRODUODENOSCOPY (EGD) WITH PROPOFOL N/A 01/18/2018   Procedure: ESOPHAGOGASTRODUODENOSCOPY (EGD) WITH PROPOFOL;  Surgeon: [name], MD;  Location: [facility] ENDOSCOPY;  Service: Endoscopy;  Laterality: N/A;   ESOPHAGOGASTRODUODENOSCOPY (EGD) WITH PROPOFOL N/A 08/11/2020   Procedure: ESOPHAGOGASTRODUODENOSCOPY (EGD) WITH PROPOFOL;  Surgeon: Lesly Rubenstein, MD;  Location: [facility] ENDOSCOPY;  Service: Endoscopy;  Laterality: N/A;   JOINT REPLACEMENT Left 2009   total knee replacement   KNEE ARTHROSCOPY     LUMBAR LAMINECTOMY/DECOMPRESSION MICRODISCECTOMY N/A 08/22/2018   Procedure: LUMBAR LAMINECTOMY/DECOMPRESSION MICRODISCECTOMY 1 LEVEL-L2-5;  Surgeon: Meade Maw, MD;  Location: [facility] ORS;  Service: Neurosurgery;  Laterality: N/A;   Torticollis surgery x 2      SOCIAL HISTORY:   Social History   Tobacco Use   Smoking status: Never   Smokeless tobacco: Never  Substance Use Topics   Alcohol use: Never    FAMILY HISTORY:   Family History  Problem Relation Age of Onset   Heart disease Mother    Heart disease Sister    Cancer Neg Hx    Diabetes Neg Hx    Breast cancer Neg Hx    Amblyopia Neg Hx    Blindness Neg Hx    Cataracts Neg Hx    Glaucoma Neg Hx    Macular degeneration Neg Hx    Retinal detachment Neg Hx    Strabismus Neg Hx    Retinitis pigmentosa Neg Hx     DRUG ALLERGIES:   Allergies  Allergen Reactions   Ibuprofen Other (See Comments)    History of esophagogastritis and barretts esophagus No bc powder or like products   Chlorhexidine Rash   Cymbalta [Duloxetine Hcl] Itching   Penicillins Itching and Rash    Has patient had a PCN reaction causing immediate rash, facial/tongue/throat swelling, SOB  or lightheadedness with hypotension: No Has patient had a PCN reaction causing severe rash involving mucus membranes or skin necrosis: No Has patient had a PCN reaction that required hospitalization: No Has patient had a PCN reaction occurring within the last 10 years: No If all of the above answers are "NO", then may proceed with Cephalosporin use.   Sulfa Antibiotics Rash    REVIEW OF SYSTEMS:   ROS As per history of present illness. All pertinent systems were reviewed above. Constitutional, HEENT, cardiovascular, respiratory, GI, GU, musculoskeletal, neuro, psychiatric, endocrine, integumentary and hematologic systems were reviewed and are otherwise negative/unremarkable except for positive findings mentioned above in the HPI.   MEDICATIONS AT HOME:   Prior to Admission medications   Medication Sig Start Date End Date Taking? Authorizing Provider  acetaminophen (TYLENOL) 325 MG tablet Take 650 mg by mouth every 6 (six) hours as needed.  Yes [provider]  aspirin EC 81 MG tablet Take 81 mg by mouth daily.   Yes [provider]  Azelastine HCl 137 MCG/SPRAY SOLN Place 1 spray into both nostrils 2 (two) times daily. 06/29/22  Yes [provider]  baclofen (LIORESAL) 20 MG tablet Take 20 mg by mouth 2 (two) times daily. 05/20/22  Yes [provider]  calcium-vitamin D (OSCAL WITH D) 500-200 MG-UNIT tablet Take 1 tablet by mouth daily.   Yes [provider]  diltiazem (CARDIZEM) 60 MG tablet Take 60 mg by mouth 2 (two) times daily. 06/01/22  Yes [provider]  erythromycin ophthalmic ointment Place 1 Application into the right eye 3 (three) times daily. 06/23/22  Yes [provider]  loratadine (CLARITIN) 10 MG tablet Take 1 tablet (10 mg total) by mouth daily. 05/30/18 07/08/22 Yes Merlyn Lot, MD  losartan (COZAAR) 50 MG tablet Take 50 mg by mouth 2 (two) times daily. 05/04/22  Yes [provider]  mirtazapine  (REMERON) 15 MG tablet Take 15 mg by mouth at bedtime. 06/16/22  Yes [provider]  montelukast (SINGULAIR) 10 MG tablet Take 10 mg by mouth daily.    Yes [provider]  Multiple Vitamin (MULTIVITAMIN) tablet Take 1 tablet by mouth daily at 3 pm.    Yes [provider]  Multiple Vitamins-Minerals (PRESERVISION AREDS PO) Take 2 capsules by mouth 2 (two) times daily. Take one capsule in the morning and one in the evening   Yes [provider]  neomycin-polymyxin b-dexamethasone (MAXITROL) 3.5-10000-0.1 SUSP Place 1 drop into the right eye 3 (three) times daily. 05/10/22  Yes [provider]  ofloxacin (FLOXIN) 0.3 % OTIC solution Place 10 drops into the right ear daily. 06/23/22  Yes [provider]  pantoprazole (PROTONIX) 40 MG tablet Take 40 mg by mouth daily before breakfast.  09/25/17  Yes [provider]  timolol (TIMOPTIC) 0.25 % ophthalmic solution Place 1 drop into both eyes daily.   Yes [provider]  tiZANidine (ZANAFLEX) 4 MG tablet Take by mouth. 07/23/19  Yes [provider]  atorvastatin (LIPITOR) 40 MG tablet Take 1 tablet (40 mg total) by mouth daily. 10/31/17 07/15/21  Vaughan Basta, MD  Biotin 5 MG CAPS Take 5 mg by mouth daily. Patient not taking: Reported on 07/15/2021    [provider]  CARTIA XT 180 MG 24 hr capsule Take 180 mg by mouth 2 (two) times daily. Patient not taking: Reported on 07/08/2022 03/03/16   [provider]  diclofenac (VOLTAREN) 75 MG EC tablet  11/05/19   [provider]  LAGEVRIO 200 MG CAPS capsule Take 4 capsules by mouth 2 (two) times daily. Patient not taking: Reported on 07/08/2022 02/09/22   [provider]  metoprolol tartrate (LOPRESSOR) 25 MG tablet Take 0.5 tablets (12.5 mg total) by mouth 2 (two) times daily. Patient not taking: Reported on 07/15/2021 10/31/17 08/11/20  Vaughan Basta, MD      VITAL SIGNS:  Blood pressure  (!) 145/81, pulse 73, temperature 97.8 F (36.6 C), resp. rate 17, height '5\' 3"'$  (1.6 m), weight 49 kg, SpO2 98 %.  PHYSICAL EXAMINATION:  Physical Exam  GENERAL:  81 y.o.-year-old Caucasian female patient lying in the bed with no acute distress.  EYES: Pupils equal, round, reactive to light and accommodation. No scleral icterus. Extraocular muscles intact.  HEENT: Head atraumatic, normocephalic. Oropharynx and nasopharynx clear.  NECK:  Supple, no jugular venous distention. No thyroid enlargement, no  tenderness.  LUNGS: Normal breath sounds bilaterally, no wheezing, rales,rhonchi or crepitation. No use of accessory muscles of respiration.  CARDIOVASCULAR: She initially had irregularly irregular tach tachycardic and later converted to normal sinus rhythm with regular rate and rhythm.  S1, S2 normal. No murmurs, rubs, or gallops.  ABDOMEN: Soft, nondistended, nontender. Bowel sounds present. No organomegaly or mass.  EXTREMITIES: No pedal edema, cyanosis, or clubbing.  NEUROLOGIC: Cranial nerves II through XII are intact. Muscle strength 5/5 in all extremities. Sensation intact. Gait not checked.  PSYCHIATRIC: The patient is alert and oriented x 3.  Normal affect and good eye contact. SKIN: No obvious rash, lesion, or ulcer.   LABORATORY PANEL:   CBC Recent Labs  Lab 07/08/22 1637  WBC 9.7  HGB 13.0  HCT 39.0  PLT 319   ------------------------------------------------------------------------------------------------------------------  Chemistries  Recent Labs  Lab 07/08/22 1637  NA 136  K 4.6  CL 103  CO2 24  GLUCOSE 120*  BUN 14  CREATININE 0.62  CALCIUM 8.3*  AST 32  ALT 34  ALKPHOS 69  BILITOT 0.7   ------------------------------------------------------------------------------------------------------------------  Cardiac Enzymes No results for input(s): "TROPONINI" in the last 168  hours. ------------------------------------------------------------------------------------------------------------------  RADIOLOGY:  CT Head Wo Contrast  Result Date: 07/08/2022 CLINICAL DATA:  Altered mental status EXAM: CT HEAD WITHOUT CONTRAST TECHNIQUE: Contiguous axial images were obtained from the base of the skull through the vertex without intravenous contrast. RADIATION DOSE REDUCTION: This exam was performed according to the departmental dose-optimization program which includes automated exposure control, adjustment of the mA and/or kV according to patient size and/or use of iterative reconstruction technique. COMPARISON:  04/29/2022 FINDINGS: Brain: No acute intracranial findings are seen in noncontrast CT brain. There are no signs of bleeding within the cranium. Cortical sulci are prominent. Small old lacunar infarcts are seen in basal ganglia. There is decreased density in periventricular white matter. Vascular: Unremarkable. Skull: Unremarkable. Sinuses/Orbits: No acute findings are seen. Other: None. IMPRESSION: No acute intracranial findings are seen a noncontrast CT brain. Atrophy. 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 Electronically Signed   By: Elmer Picker M.D.   On: 07/08/2022 18:25   DG Chest Port 1 View  Result Date: 07/08/2022 CLINICAL DATA:  Altered mental status EXAM: PORTABLE CHEST 1 VIEW COMPARISON:  10/30/2017 FINDINGS: No acute airspace disease or effusion. Borderline cardiac enlargement. Aortic atherosclerosis. No pneumothorax. IMPRESSION: No active disease.  Borderline cardiomegaly Electronically Signed   By: Donavan Foil M.D.   On: 07/08/2022 17:11      IMPRESSION AND PLAN:  Assessment and Plan: * Atrial fibrillation with RVR ([place]) - The patient will be admitted to a progressive unit bed. -She will be continued on IV Cardizem drip. -Her CHADS2 BASC score is 6 as her head CT scan showed old lacunar infarctions and being female  over 27 and having history of hypertension. - We will therefore start her on p.o. Eliquis. - We will follow serial troponins. - TSH was within normal but free T4 was slightly elevated.  This could be a manifestation of hyperthyroidism.  We will continue monitoring for now.  She is not having signs of hyperthyroidism otherwise.  We will continue her Lopressor for now and increase the dose. - We will check magnesium level.  Coronary artery disease - We will continue aspirin, statin therapy and beta-blocker therapy.  GERD without esophagitis - We will continue PPI therapy.  Dyslipidemia - We will continue statin therapy.  Essential hypertension - We  will continue her anti-hypertensives.    We will obtain a cardiology consult and 2D echo.  I notified Dr. Nehemiah Massed about the patient.   DVT prophylaxis: Eliquis Advanced Care Planning:  Code Status: She is DNR/DNI.  This was discussed with her and her son. Family Communication:  The plan of care was discussed in details with the patient (and family). I answered all questions. The patient agreed to proceed with the above mentioned plan. Further management will depend upon hospital course. Disposition Plan: Back to previous home environment Consults called: Cardiology. All the records are reviewed and case discussed with ED provider.  Status is: Inpatient    At the time of the admission, it appears that the appropriate admission status for this patient is inpatient.  This is judged to be reasonable and necessary in order to provide the required intensity of service to ensure the patient's safety given the presenting symptoms, physical exam findings and initial radiographic and laboratory data in the context of comorbid conditions.  The patient requires inpatient status due to high intensity of service, high risk of further deterioration and high frequency of surveillance required.  I certify that at the time of admission, it is my clinical  judgment that the patient will require inpatient hospital care extending more than 2 midnights.                            Dispo: The patient is from: Home              Anticipated d/c is to: Home              Patient currently is not medically stable to d/c.              Difficult to place patient: No  Christel Mormon M.D on 07/08/2022 at 9:53 PM  [facility]   From 7 PM-7 AM, contact night-coverage [email or web address]  CC: Primary care physician; Idelle Crouch, MD

## 2022-07-08 NOTE — Assessment & Plan Note (Signed)
-   We will continue statin therapy. 

## 2022-07-08 NOTE — Progress Notes (Signed)
CODE SEPSIS - PHARMACY COMMUNICATION  **Broad Spectrum Antibiotics should be administered within 1 hour of Sepsis diagnosis**  Time Code Sepsis Called/Page Received: 1649  Antibiotics Ordered: Cefepime 2g x1  Time of 1st antibiotic administration: 1700  Additional action taken by pharmacy: N/A   Gretel Acre, PharmD PGY1 Pharmacy Resident 07/08/2022 4:56 PM

## 2022-07-08 NOTE — Assessment & Plan Note (Signed)
-   We will continue aspirin, statin therapy and beta-blocker therapy.

## 2022-07-08 NOTE — ED Provider Notes (Signed)
[facility] Provider Note    Event Date/Time   First MD Initiated Contact with Patient 07/08/22 1641     (approximate)   History   Altered Mental Status   HPI  Mary Thornton is a 81 y.o. female with a history of CAD, hypertension, hyperlipidemia, MGUS, Barrett's esophagus, diastolic CHF who presents with altered mental status and rapid heart rate.  The patient is unable to give much history but states that she feels weak.  She denies any acute pain.  She has no palpitations or difficulty breathing.  She denies nausea or vomiting.    Physical Exam   Triage Vital Signs: ED Triage Vitals [07/08/22 1644]  Enc Vitals Group     BP (!) 132/91     Pulse Rate (!) 140     Resp 18     Temp      Temp src      SpO2 98 %     Weight      Height      Head Circumference      Peak Flow      Pain Score      Pain Loc      Pain Edu?      Excl. in [place]?     Most recent vital signs: Vitals:   07/08/22 1845 07/08/22 1849  BP:  132/84  Pulse: (!) 145 (!) 133  Resp: (!) 28 (!) 24  Temp:  98.6 F (37 C)  SpO2: 98% 95%     General: Alert and oriented, weak appearing but in no acute distress. CV:  Good peripheral perfusion.  Tachycardic, irregular rhythm. Resp:  Normal effort.  Lungs CTAB. Abd:  No distention.  Soft and nontender. Other:  No peripheral edema.   ED Results / Procedures / Treatments   Labs (all labs ordered are listed, but only abnormal results are displayed) Labs Reviewed  COMPREHENSIVE METABOLIC PANEL - Abnormal; Notable for the following components:      Result Value   Glucose, Bld 120 (*)    Calcium 8.3 (*)    Total Protein 6.3 (*)    All other components within normal limits  APTT - Abnormal; Notable for the following components:   aPTT 41 (*)    All other components within normal limits  URINALYSIS, COMPLETE (UACMP) WITH MICROSCOPIC - Abnormal; Notable for the following components:   Color, Urine YELLOW (*)    APPearance  CLEAR (*)    Ketones, ur 5 (*)    All other components within normal limits  BRAIN NATRIURETIC PEPTIDE - Abnormal; Notable for the following components:   B Natriuretic Peptide 144.5 (*)    All other components within normal limits  TROPONIN I (HIGH SENSITIVITY) - Abnormal; Notable for the following components:   Troponin I (High Sensitivity) 22 (*)    All other components within normal limits  RESP PANEL BY RT-PCR (FLU A&B, COVID) ARPGX2  CULTURE, BLOOD (ROUTINE X 2)  CULTURE, BLOOD (ROUTINE X 2)  URINE CULTURE  LACTIC ACID, PLASMA  LACTIC ACID, PLASMA  CBC WITH DIFFERENTIAL/PLATELET  PROTIME-INR  BASIC METABOLIC PANEL  CBC  TSH  TROPONIN I (HIGH SENSITIVITY)     EKG  ED ECG REPORT I, Arta Silence, the attending physician, personally viewed and interpreted this ECG.  Date: 07/08/2022 EKG Time: 1641 Rate: 161 Rhythm: Atrial fibrillation with RVR QRS Axis: normal Intervals: LAFB ST/T Wave abnormalities: LVH with repolarization abnormality Narrative Interpretation: Atrial fibrillation with RVR, new from  prior EKGs; no evidence of acute ischemia    RADIOLOGY  Chest x-ray: I independently viewed and interpreted the images; there is no focal consolidation or edema  PROCEDURES:  Critical Care performed: Yes, see critical care procedure note(s)  .Critical Care  Performed by: Arta Silence, MD Authorized by: Arta Silence, MD   Critical care provider statement:    Critical care time (minutes):  30   Critical care was necessary to treat or prevent imminent or life-threatening deterioration of the following conditions:  Cardiac failure and circulatory failure   Critical care was time spent personally by me on the following activities:  Development of treatment plan with patient or surrogate, discussions with consultants, evaluation of patient's response to treatment, examination of patient, ordering and review of laboratory studies, ordering and review  of radiographic studies, ordering and performing treatments and interventions, pulse oximetry, re-evaluation of patient's condition, review of old charts and obtaining history from patient or surrogate   Care discussed with: admitting provider      MEDICATIONS ORDERED IN ED: Medications  diltiazem (CARDIZEM) 125 mg in dextrose 5% 125 mL (1 mg/mL) infusion (5 mg/hr Intravenous New Bag/Given 07/08/22 1911)  0.9 %  sodium chloride infusion (has no administration in time range)  ondansetron (ZOFRAN) tablet 4 mg (has no administration in time range)    Or  ondansetron (ZOFRAN) injection 4 mg (has no administration in time range)  magnesium hydroxide (MILK OF MAGNESIA) suspension 30 mL (has no administration in time range)  acetaminophen (TYLENOL) tablet 650 mg (has no administration in time range)    Or  acetaminophen (TYLENOL) suppository 650 mg (has no administration in time range)  traZODone (DESYREL) tablet 25 mg (has no administration in time range)  diltiazem (CARDIZEM) injection 5 mg (5 mg Intravenous Given 07/08/22 1651)  sodium chloride 0.9 % bolus 1,000 mL (0 mLs Intravenous Stopped 07/08/22 1847)  ceFEPIme (MAXIPIME) 2 g in sodium chloride 0.9 % 100 mL IVPB (0 g Intravenous Stopped 07/08/22 1749)  vancomycin (VANCOCIN) IVPB 1000 mg/200 mL premix (0 mg Intravenous Stopped 07/08/22 1847)     IMPRESSION / MDM / [place] / ED COURSE  I reviewed the triage vital signs and the nursing notes.  81 year old female with PMH as noted above presents with generalized weakness, possible altered mental status, and rapid atrial fibrillation.  I reviewed the past medical records.  The patient was most recently seen by her PMD Dr. Fulton Mole on 8/30 for right ear pain.  She does not have atrial fibrillation listed in her PMH at that time.  I was subsequently able to get additional history from the patient's son who is a paramedic.  He confirms that the patient has no prior history of atrial  fibrillation.  She has been increasingly nervous and jittery over the last week and then in the last several days has become more weak.  On exam the patient is tachycardic and in rapid atrial fibrillation.  Neurologic exam is nonfocal.  Exam is otherwise as described above.  Differential diagnosis includes, but is not limited to, new onset atrial fibrillation, ACS, acute CHF, acute infection/sepsis including possible UTI or pneumonia, dehydration, electrolyte disturbance.  Patient's presentation is most consistent with acute presentation with potential threat to life or bodily function.  I ordered IV fluids and Cardizem as well as lab work-up and empiric antibiotics per the sepsis protocol, as well as CT head due to AMS.  I anticipate admission.  The patient is on the cardiac monitor  to evaluate for evidence of arrhythmia and/or significant heart rate changes.  ----------------------------------------- 7:18 PM on 07/08/2022 -----------------------------------------  Heart rate improved to the 120s with a Cardizem dose but is once again elevated to the 140s, so I have started a Cardizem infusion.  CT head shows no acute abnormality.  Infectious work-up is negative with normal WBC count and lactate, negative respiratory panel, and negative urinalysis.  Chest x-ray is clear.  The patient will need further work-up and management for new onset atrial fibrillation.  I consulted Dr. Sidney Ace from the hospitalist service; based on her discussion he agrees to admit the patient.    FINAL CLINICAL IMPRESSION(S) / ED DIAGNOSES   Final diagnoses:  Atrial fibrillation with RVR ([place])     Rx / DC Orders   ED Discharge Orders     None        Note:  This document was prepared using Dragon voice recognition software and may include unintentional dictation errors.    Arta Silence, MD 07/08/22 1925

## 2022-07-08 NOTE — Assessment & Plan Note (Addendum)
-   The patient will be admitted to a progressive unit bed. -She will be continued on IV Cardizem drip. 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 - We will therefore start her on p.o. Eliquis. - We will follow serial troponins. - TSH was within normal but free T4 was slightly elevated.  This could be a manifestation of hyperthyroidism.  We will continue monitoring for now.  She is not having signs of hyperthyroidism otherwise.  We will continue her Lopressor for now and increase the dose. - We will check magnesium level.

## 2022-07-09 DIAGNOSIS — R001 Bradycardia, unspecified: Secondary | ICD-10-CM | POA: Diagnosis not present

## 2022-07-09 DIAGNOSIS — R531 Weakness: Secondary | ICD-10-CM

## 2022-07-09 DIAGNOSIS — I4891 Unspecified atrial fibrillation: Secondary | ICD-10-CM | POA: Diagnosis not present

## 2022-07-09 DIAGNOSIS — I480 Paroxysmal atrial fibrillation: Secondary | ICD-10-CM | POA: Diagnosis not present

## 2022-07-09 DIAGNOSIS — I10 Essential (primary) hypertension: Secondary | ICD-10-CM | POA: Diagnosis not present

## 2022-07-09 DIAGNOSIS — R000 Tachycardia, unspecified: Secondary | ICD-10-CM | POA: Diagnosis not present

## 2022-07-09 DIAGNOSIS — R413 Other amnesia: Secondary | ICD-10-CM | POA: Diagnosis not present

## 2022-07-09 LAB — CBC
HCT: 38.2 % (ref 36.0–46.0)
Hemoglobin: 12.8 g/dL (ref 12.0–15.0)
MCH: 31.9 pg (ref 26.0–34.0)
MCHC: 33.5 g/dL (ref 30.0–36.0)
MCV: 95.3 fL (ref 80.0–100.0)
Platelets: 293 10*3/uL (ref 150–400)
RBC: 4.01 MIL/uL (ref 3.87–5.11)
RDW: 13.2 % (ref 11.5–15.5)
WBC: 11.6 10*3/uL — ABNORMAL HIGH (ref 4.0–10.5)
nRBC: 0 % (ref 0.0–0.2)

## 2022-07-09 LAB — PROTIME-INR
INR: 1.2 (ref 0.8–1.2)
Prothrombin Time: 15.1 seconds (ref 11.4–15.2)

## 2022-07-09 LAB — BASIC METABOLIC PANEL
Anion gap: 8 (ref 5–15)
BUN: 12 mg/dL (ref 8–23)
CO2: 25 mmol/L (ref 22–32)
Calcium: 8.6 mg/dL — ABNORMAL LOW (ref 8.9–10.3)
Chloride: 104 mmol/L (ref 98–111)
Creatinine, Ser: 0.51 mg/dL (ref 0.44–1.00)
GFR, Estimated: >60 mL/min (ref >60)
Glucose, Bld: 119 mg/dL — ABNORMAL HIGH (ref 70–99)
Potassium: 3.8 mmol/L (ref 3.5–5.1)
Sodium: 137 mmol/L (ref 135–145)

## 2022-07-09 LAB — MAGNESIUM: Magnesium: 2.4 mg/dL (ref 1.7–2.4)

## 2022-07-09 MED ORDER — ALPRAZOLAM 0.25 MG PO TABS
0.2500 mg | ORAL_TABLET | Freq: Two times a day (BID) | ORAL | Status: DC | PRN
Start: 2022-07-09 — End: 2022-07-11
  Administered 2022-07-09 – 2022-07-10 (×2): 0.25 mg via ORAL
  Filled 2022-07-09 (×2): qty 1

## 2022-07-09 MED ORDER — DILTIAZEM HCL 30 MG PO TABS
60.0000 mg | ORAL_TABLET | Freq: Two times a day (BID) | ORAL | Status: DC
  Administered 2022-07-09: 60 mg via ORAL
  Filled 2022-07-09: qty 2

## 2022-07-09 NOTE — Care Management CC44 (Signed)
Condition Code 44 Documentation Completed  Patient Details  Name: Mary Thornton MRN: [id number] Date of Birth: 1941/08/07   Condition Code 44 given:  Yes Patient signature on Condition Code 44 notice:  Yes Documentation of 2 MD's agreement:  Yes Code 44 added to claim:  Yes    [name], RN 07/09/2022, 10:37 AM

## 2022-07-09 NOTE — Consult Note (Signed)
[facility] Cardiology Consultation Note  Patient ID: Mary Thornton, MRN: [id number], DOB/AGE: 05/02/1941 81 y.o. Admit date: 07/08/2022   Date of Consult: 07/09/2022 Primary Physician: Idelle Crouch, MD Primary Cardiologist: [facility]  Chief Complaint:  Chief Complaint  Patient presents with   Altered Mental Status   Reason for Consult:  Tachycardia  HPI: 81 y.o. female with known apparent previous cardiovascular disease hypertension and hyperlipidemia who has had appropriate medication management for these risk factors.  The patient has had sudden onset of issues abnormal sensorium weakness fatigue and unable to do the things she wishes.  There is no apparent chest pain shortness of breath or congestive heart failure type symptoms.  The patient was seen in the emergency room for these issues and noticed to had a tachycardia.  EKG shows sinus tachycardia with nonspecific ST changes.  There is telemetry showing sinus tachycardia.  There is no Apparent atrial fibrillation at this time.  The patient has previously been on diltiazem for apparent supraventricular tachycardia.  It is unclear whether the is significant previous history of atrial fibrillation.  The patient's blood pressure has been relatively stable and currently she has had troponin levels of 31 consistent with no evidence of concerns for cardiovascular event.  Chest x-ray was normal.  At this time the patient therefore may not need any further investigation or treatment from the cardiovascular standpoint but may need some evaluation from a primary cause of her sluggishness weakness and fatigue.  Past Medical History:  Diagnosis Date   Arthritis    Asthma    Barrett esophagus    Complication of anesthesia    very hard to wake up. feels like she had too much medicine. 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 Herbert Deaner   COLONOSCOPY WITH PROPOFOL N/A 06/23/2015   Procedure: COLONOSCOPY WITH PROPOFOL;  Surgeon: [name], MD;  Location: [facility] ENDOSCOPY;  Service: Endoscopy;  Laterality: N/A;   COLONOSCOPY WITH PROPOFOL N/A 08/11/2020   Procedure: COLONOSCOPY WITH PROPOFOL;  Surgeon: Lesly Rubenstein, MD;  Location: [facility] ENDOSCOPY;  Service: Endoscopy;  Laterality: N/A;   ESOPHAGOGASTRODUODENOSCOPY (EGD) WITH PROPOFOL N/A 06/23/2015   Procedure: ESOPHAGOGASTRODUODENOSCOPY (EGD) WITH PROPOFOL;  Surgeon: [name], MD;  Location: [facility] ENDOSCOPY;  Service: Endoscopy;  Laterality: N/A;   ESOPHAGOGASTRODUODENOSCOPY (EGD) WITH PROPOFOL N/A 11/28/2017   Procedure: ESOPHAGOGASTRODUODENOSCOPY (EGD) WITH PROPOFOL;  Surgeon: [name], MD;  Location: [facility] ENDOSCOPY;  Service: Endoscopy;  Laterality: N/A;   ESOPHAGOGASTRODUODENOSCOPY (EGD) WITH PROPOFOL N/A 01/18/2018   Procedure: ESOPHAGOGASTRODUODENOSCOPY (EGD) WITH PROPOFOL;  Surgeon: [name], MD;  Location: [facility] ENDOSCOPY;  Service: Endoscopy;  Laterality: N/A;   ESOPHAGOGASTRODUODENOSCOPY (EGD) WITH PROPOFOL N/A 08/11/2020   Procedure: ESOPHAGOGASTRODUODENOSCOPY (EGD) WITH PROPOFOL;  Surgeon: Lesly Rubenstein, MD;  Location: [facility] ENDOSCOPY;  Service: Endoscopy;  Laterality: N/A;   JOINT REPLACEMENT  Left 2009   total knee replacement   KNEE ARTHROSCOPY     LUMBAR LAMINECTOMY/DECOMPRESSION MICRODISCECTOMY N/A 08/22/2018   Procedure: LUMBAR LAMINECTOMY/DECOMPRESSION MICRODISCECTOMY 1 LEVEL-L2-5;  Surgeon: Meade Maw, MD;  Location: [facility] ORS;  Service: Neurosurgery;  Laterality: N/A;   Torticollis surgery x 2       Home Meds: Prior to Admission medications   Medication Sig Start Date End Date Taking? Authorizing Provider  acetaminophen (TYLENOL) 325 MG tablet Take 650 mg by mouth every 6 (six) hours as needed.   Yes [provider]  aspirin EC 81 MG tablet Take 81 mg by mouth daily.   Yes [provider]  Azelastine HCl 137 MCG/SPRAY SOLN Place 1 spray into both nostrils 2 (two) times daily. 06/29/22  Yes [provider]  baclofen (LIORESAL) 20 MG tablet Take 20 mg by mouth 2 (two) times daily. 05/20/22  Yes [provider]  calcium-vitamin D (OSCAL WITH D) 500-200 MG-UNIT tablet Take 1 tablet by mouth daily.   Yes [provider]  diltiazem (CARDIZEM) 60 MG tablet Take 60 mg by mouth 2 (two) times daily. 06/01/22  Yes [provider]  erythromycin ophthalmic ointment Place 1 Application into the right eye 3 (three) times daily. 06/23/22  Yes [provider]  loratadine (CLARITIN) 10 MG tablet Take 1 tablet (10 mg total) by mouth daily. 05/30/18 07/08/22 Yes Merlyn Lot, MD  losartan (COZAAR) 50 MG tablet Take 50 mg by mouth 2 (two) times daily. 05/04/22  Yes [provider]  mirtazapine (REMERON) 15 MG tablet Take 15 mg by mouth at bedtime. 06/16/22  Yes [provider]  montelukast (SINGULAIR) 10 MG tablet Take 10 mg by mouth daily.    Yes [provider]  Multiple Vitamin (MULTIVITAMIN) tablet Take 1 tablet by mouth daily at 3 pm.    Yes [provider]  Multiple Vitamins-Minerals (PRESERVISION AREDS PO) Take 2 capsules by mouth 2 (two) times daily. Take one capsule in the morning and one  in the evening   Yes [provider]  neomycin-polymyxin b-dexamethasone (MAXITROL) 3.5-10000-0.1 SUSP Place 1 drop into the right eye 3 (three) times daily. 05/10/22  Yes [provider]  ofloxacin (FLOXIN) 0.3 % OTIC solution Place 10 drops into the right ear daily. 06/23/22  Yes [provider]  pantoprazole (PROTONIX) 40 MG tablet Take 40 mg by mouth daily before breakfast.  09/25/17  Yes [provider]  timolol (TIMOPTIC) 0.25 % ophthalmic solution Place 1 drop into both eyes 2 (two) times daily. '@0800'$ /1700   Yes [provider]  tiZANidine (ZANAFLEX) 4 MG tablet Take by mouth. 07/23/19  Yes [provider]  atorvastatin (LIPITOR) 40 MG tablet Take 1 tablet (40 mg total) by mouth daily. 10/31/17 07/15/21  Vaughan Basta, MD  CARTIA XT 180 MG 24 hr capsule Take 180 mg by mouth 2 (two) times daily. Patient not taking: Reported on 07/08/2022 03/03/16   [provider]  diclofenac (VOLTAREN) 75 MG EC tablet  11/05/19   [provider]  LAGEVRIO 200 MG CAPS capsule Take 4 capsules by mouth 2 (two) times daily. Patient not taking: Reported on 07/08/2022 02/09/22   [provider]  metoprolol tartrate (LOPRESSOR) 25 MG tablet  Take 0.5 tablets (12.5 mg total) by mouth 2 (two) times daily. Patient not taking: Reported on 07/15/2021 10/31/17 08/11/20  Vaughan Basta, MD    Inpatient Medications:   apixaban  2.5 mg Oral BID   aspirin EC  81 mg Oral Daily   atorvastatin  40 mg Oral Daily   azelastine  1 spray Each Nare BID   baclofen  20 mg Oral BID   calcium-vitamin D  1 tablet Oral Daily   diltiazem  60 mg Oral Q12H   erythromycin  1 Application Right Eye TID   loratadine  10 mg Oral Daily   losartan  50 mg Oral BID   metoprolol tartrate  12.5 mg Oral BID   mirtazapine  15 mg Oral QHS   montelukast  10 mg Oral Daily   multivitamin with minerals  1 tablet Oral Q1500   multivitamin-lutein  1 capsule Oral BID    pantoprazole  40 mg Oral QAC breakfast   timolol  1 drop Both Eyes BID     Allergies:  Allergies  Allergen Reactions   Ibuprofen Other (See Comments)    History of esophagogastritis and barretts esophagus No bc powder or like products   Chlorhexidine Rash   Cymbalta [Duloxetine Hcl] Itching   Penicillins Itching and Rash    Has patient had a PCN reaction causing immediate rash, facial/tongue/throat swelling, SOB or lightheadedness with hypotension: No Has patient had a PCN reaction causing severe rash involving mucus membranes or skin necrosis: No Has patient had a PCN reaction that required hospitalization: No Has patient had a PCN reaction occurring within the last 10 years: No If all of the above answers are "NO", then may proceed with Cephalosporin use.   Sulfa Antibiotics Rash    Social History   Socioeconomic History   Marital status: Widowed    Spouse name: Not on file   Number of children: Not on file   Years of education: Not on file   Highest education level: Not on file  Occupational History   Occupation: [employer]    Comment: retired  Tobacco Use   Smoking status: Never   Smokeless tobacco: Never  Vaping Use   Vaping Use: Never used  Substance and Sexual Activity   Alcohol use: Never   Drug use: Never   Sexual activity: Not Currently    Birth control/protection: Surgical  Other Topics Concern   Not on file  Social History Narrative   Not on file   Social Determinants of Health   Financial Resource Strain: Not on file  Food Insecurity: No Food Insecurity (07/08/2022)   Hunger Vital Sign    Worried About Running Out of Food in the Last Year: Never true    Ran Out of Food in the Last Year: Never true  Transportation Needs: No Transportation Needs (07/08/2022)   PRAPARE - Hydrologist (Medical): No    Lack of Transportation (Non-Medical): No  Physical Activity: Not on file  Stress: Not on file  Social Connections: Not on  file  Intimate Partner Violence: Not At Risk (07/08/2022)   Humiliation, Afraid, Rape, and Kick questionnaire    Fear of Current or Ex-Partner: No    Emotionally Abused: No    Physically Abused: No    Sexually Abused: No     Family History  Problem Relation Age of Onset   Heart disease Mother    Heart disease Sister    Cancer Neg Hx  Diabetes Neg Hx    Breast cancer Neg Hx    Amblyopia Neg Hx    Blindness Neg Hx    Cataracts Neg Hx    Glaucoma Neg Hx    Macular degeneration Neg Hx    Retinal detachment Neg Hx    Strabismus Neg Hx    Retinitis pigmentosa Neg Hx      Review of Systems Positive for weakness fatigue Negative for: General:  chills, fever, night sweats or weight changes.  Cardiovascular: PND orthopnea syncope dizziness  Dermatological skin lesions rashes Respiratory: Cough congestion Urologic: Frequent urination urination at night and hematuria Abdominal: negative for nausea, vomiting, diarrhea, bright red blood per rectum, melena, or hematemesis Neurologic: negative for visual changes, and/or hearing changes  All other systems reviewed and are otherwise negative except as noted above.  Labs: No results for input(s): "CKTOTAL", "CKMB", "TROPONINI" in the last 72 hours. Lab Results  Component Value Date   WBC 11.6 (H) 07/09/2022   HGB 12.8 07/09/2022   HCT 38.2 07/09/2022   MCV 95.3 07/09/2022   PLT 293 07/09/2022    Recent Labs  Lab 07/08/22 1637 07/09/22 0535  NA 136 137  K 4.6 3.8  CL 103 104  CO2 24 25  BUN 14 12  CREATININE 0.62 0.51  CALCIUM 8.3* 8.6*  PROT 6.3*  --   BILITOT 0.7  --   ALKPHOS 69  --   ALT 34  --   AST 32  --   GLUCOSE 120* 119*   Lab Results  Component Value Date   CHOL 217 (H) 10/31/2017   HDL 65 10/31/2017   LDLCALC 136 (H) 10/31/2017   TRIG 80 10/31/2017   No results found for: "DDIMER"  Radiology/Studies:  CT Head Wo Contrast  Result Date: 07/08/2022 CLINICAL DATA:  Altered mental status EXAM: CT HEAD  WITHOUT CONTRAST TECHNIQUE: Contiguous axial images were obtained from the base of the skull through the vertex without intravenous contrast. RADIATION DOSE REDUCTION: This exam was performed according to the departmental dose-optimization program which includes automated exposure control, adjustment of the mA and/or kV according to patient size and/or use of iterative reconstruction technique. COMPARISON:  04/29/2022 FINDINGS: Brain: No acute intracranial findings are seen in noncontrast CT brain. There are no signs of bleeding within the cranium. Cortical sulci are prominent. Small old lacunar infarcts are seen in basal ganglia. There is decreased density in periventricular white matter. Vascular: Unremarkable. Skull: Unremarkable. Sinuses/Orbits: No acute findings are seen. Other: None. IMPRESSION: No acute intracranial findings are seen a noncontrast CT brain. Atrophy. 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 Electronically Signed   By: Elmer Picker M.D.   On: 07/08/2022 18:25   DG Chest Port 1 View  Result Date: 07/08/2022 CLINICAL DATA:  Altered mental status EXAM: PORTABLE CHEST 1 VIEW COMPARISON:  10/30/2017 FINDINGS: No acute airspace disease or effusion. Borderline cardiac enlargement. Aortic atherosclerosis. No pneumothorax. IMPRESSION: No active disease.  Borderline cardiomegaly Electronically Signed   By: Donavan Foil M.D.   On: 07/08/2022 17:11    EKG: Sinus tachycardia with left ventricular hypertrophy  Weights: Filed Weights   07/08/22 1700 07/08/22 2152  Weight: 47.6 kg 49 kg     Physical Exam: Blood pressure 135/64, pulse 72, temperature 97.6 F (36.4 C), resp. rate 18, height '5\' 3"'$  (1.6 m), weight 49 kg, SpO2 100 %. Body mass index is 19.14 kg/m. General: Well developed, well nourished, in no acute distress. Head eyes ears  nose throat: Normocephalic, atraumatic, sclera non-icteric, no xanthomas, nares are without discharge. No apparent  thyromegaly and/or mass  Lungs: Normal respiratory effort.  no wheezes, no rales, no rhonchi.  Heart: RRR with normal S1 S2. no murmur gallop, no rub, PMI is normal size and placement, carotid upstroke normal without bruit, jugular venous pressure is normal Abdomen: Soft, non-tender, non-distended with normoactive bowel sounds. No hepatomegaly. No rebound/guarding. No obvious abdominal masses. Abdominal aorta is normal size without bruit Extremities: No edema. no cyanosis, no clubbing, no ulcers  Peripheral : 2+ bilateral upper extremity pulses, 2+ bilateral femoral pulses, 2+ bilateral dorsal pedal pulse Neuro: Alert and oriented. No facial asymmetry. No focal deficit. Moves all extremities spontaneously. Musculoskeletal: Normal muscle tone without kyphosis Psych:  Responds to questions appropriately with a normal affect.    Assessment: 81 year old female with hypertension hyperlipidemia and apparent previous supraventricular tachycardia having weakness and fatigue but no current evidence of congestive heart failure or acute coronary syndrome myocardial infarction and no current evidence of atrial fibrillation.  There is sinus tachycardia but likely secondary to primary reason that the patient is weak and fatigued and does not feel well.  Plan: 1.  May consider continuation metoprolol and diltiazem for heart rate control of previous supraventricular tachycardia although may consider the possibility of discontinuing these medications because they could be causing some of the weakness and fatigue 2.  High intensity cholesterol therapy for coronary atherosclerosis although if the patient is having side effects of this medication may discontinue and follow-up for improvements of the symptoms 3.  Consider echocardiogram for LV systolic dysfunction valvular heart disease contributing to above 4.  Further treatment options after above  Signed, Corey Skains M.D. [facility]  Cardiology 07/09/2022, 4:14 PM

## 2022-07-09 NOTE — Progress Notes (Addendum)
PROGRESS NOTE    Mary Thornton  JXB:[id number] DOB: 04/30/1941 DOA: 07/08/2022 PCP: Idelle Crouch, MD   Assessment & Plan:   Principal Problem:   Atrial fibrillation with RVR [facility]) Active Problems:   Essential hypertension   Dyslipidemia   GERD without esophagitis   Coronary artery disease  Assessment and Plan: A. fib: w/ RVR. New onset. D/c IV diltiazem drip and restart po diltiazem and continue eliquis. Cardio consulted   CAD: w/ elevated troponins. Minimally elevated troponins are secondary to demand ischemia   GERD: continue on PPI    HLD: continue on statin   HTN: continue on home dose of losartan. Holding po diltiazem while on IV diltiazem    Generalized weakness: PT/OT consulted       DVT prophylaxis: eliquis  Code Status: full  Family Communication: discussed pt's care w/ pt's family at bedside and answered their questions Disposition Plan: depends on PT/OT recs   Level of care: Progressive  Status is: Inpatient Remains inpatient appropriate because: severity of illness    Consultants:  Cardio, Dr. Nehemiah Massed   Procedures:   Antimicrobials:   Subjective: Pt c/o malaise  Objective: Vitals:   07/08/22 2152 07/08/22 2330 07/09/22 0104 07/09/22 0504  BP:  139/72 122/68 131/72  Pulse:  74 (!) 55 68  Resp:   19 18  Temp:   97.6 F (36.4 C) 98 F (36.7 C)  TempSrc:   Oral Oral  SpO2:   99% 95%  Weight: 49 kg     Height: '5\' 3"'$  (1.6 m)       Intake/Output Summary (Last 24 hours) at 07/09/2022 0830 Last data filed at 07/09/2022 0430 Gross per 24 hour  Intake --  Output 1400 ml  Net -1400 ml   Filed Weights   07/08/22 1700 07/08/22 2152  Weight: 47.6 kg 49 kg    Examination:  General exam: Appears calm and comfortable. Frail appearing  Respiratory system: Clear to auscultation. Respiratory effort normal. Cardiovascular system: irregularly irregular.. No  rubs, gallops or clicks.  Gastrointestinal system: Abdomen is nondistended,  soft and nontender. Normal bowel sounds heard. Central nervous system: Alert and awake. Moves all extremities  Psychiatry: Judgement and insight appears at baseline. Flat mood and affect    Data Reviewed: I have personally reviewed following labs and imaging studies  CBC: Recent Labs  Lab 07/08/22 1637 07/09/22 0535  WBC 9.7 11.6*  NEUTROABS 7.6  --   HGB 13.0 12.8  HCT 39.0 38.2  MCV 95.6 95.3  PLT 319 130   Basic Metabolic Panel: Recent Labs  Lab 07/08/22 1637 07/09/22 0535  NA 136 137  K 4.6 3.8  CL 103 104  CO2 24 25  GLUCOSE 120* 119*  BUN 14 12  CREATININE 0.62 0.51  CALCIUM 8.3* 8.6*   GFR: Estimated Creatinine Clearance: 42.7 mL/min (by C-G formula based on SCr of 0.51 mg/dL). Liver Function Tests: Recent Labs  Lab 07/08/22 1637  AST 32  ALT 34  ALKPHOS 69  BILITOT 0.7  PROT 6.3*  ALBUMIN 4.2   No results for input(s): "LIPASE", "AMYLASE" in the last 168 hours. No results for input(s): "AMMONIA" in the last 168 hours. Coagulation Profile: Recent Labs  Lab 07/08/22 1637 07/09/22 0535  INR 1.1 1.2   Cardiac Enzymes: No results for input(s): "CKTOTAL", "CKMB", "CKMBINDEX", "TROPONINI" in the last 168 hours. BNP (last 3 results) No results for input(s): "PROBNP" in the last 8760 hours. HbA1C: No results for input(s): "HGBA1C" in the  last 72 hours. CBG: No results for input(s): "GLUCAP" in the last 168 hours. Lipid Profile: No results for input(s): "CHOL", "HDL", "LDLCALC", "TRIG", "CHOLHDL", "LDLDIRECT" in the last 72 hours. Thyroid Function Tests: Recent Labs    07/08/22 1637  TSH 0.696  FREET4 1.55*   Anemia Panel: No results for input(s): "VITAMINB12", "FOLATE", "FERRITIN", "TIBC", "IRON", "RETICCTPCT" in the last 72 hours. Sepsis Labs: Recent Labs  Lab 07/08/22 1637 07/08/22 1906  LATICACIDVEN 1.1 1.1    Recent Results (from the past 240 hour(s))  Resp Panel by RT-PCR (Flu A&B, Covid) Anterior Nasal Swab     Status: None    Collection Time: 07/08/22  4:37 PM   Specimen: Anterior Nasal Swab  Result Value Ref Range Status   SARS Coronavirus 2 by RT PCR NEGATIVE NEGATIVE Final    Comment: (NOTE) SARS-CoV-2 target nucleic acids are NOT DETECTED.  The SARS-CoV-2 RNA is generally detectable in upper respiratory specimens during the acute phase of infection. The lowest concentration of SARS-CoV-2 viral copies this assay can detect is 138 copies/mL. A negative result does not preclude SARS-Cov-2 infection and should not be used as the sole basis for treatment or other patient management decisions. A negative result may occur with  improper specimen collection/handling, submission of specimen other than nasopharyngeal swab, presence of viral mutation(s) within the areas targeted by this assay, and inadequate number of viral copies(<138 copies/mL). A negative result must be combined with clinical observations, patient history, and epidemiological information. The expected result is Negative.  Fact Sheet for Patients:  EntrepreneurPulse.com.au  Fact Sheet for Healthcare Providers:  IncredibleEmployment.be  This test is no t yet approved or cleared by the [place] FDA and  has been authorized for detection and/or diagnosis of SARS-CoV-2 by FDA under an Emergency Use Authorization (EUA). This EUA will remain  in effect (meaning this test can be used) for the duration of the COVID-19 declaration under Section 564(b)(1) of the Act, 21 U.S.C.section 360bbb-3(b)(1), unless the authorization is terminated  or revoked sooner.       Influenza A by PCR NEGATIVE NEGATIVE Final   Influenza B by PCR NEGATIVE NEGATIVE Final    Comment: (NOTE) The Xpert Xpress SARS-CoV-2/FLU/RSV plus assay is intended as an aid in the diagnosis of influenza from Nasopharyngeal swab specimens and should not be used as a sole basis for treatment. Nasal washings and aspirates are unacceptable for  Xpert Xpress SARS-CoV-2/FLU/RSV testing.  Fact Sheet for Patients: EntrepreneurPulse.com.au  Fact Sheet for Healthcare Providers: IncredibleEmployment.be  This test is not yet approved or cleared by the [place] FDA and has been authorized for detection and/or diagnosis of SARS-CoV-2 by FDA under an Emergency Use Authorization (EUA). This EUA will remain in effect (meaning this test can be used) for the duration of the COVID-19 declaration under Section 564(b)(1) of the Act, 21 U.S.C. section 360bbb-3(b)(1), unless the authorization is terminated or revoked.  Performed at [facility], [address] [place], [place] [address]   Blood Culture (routine x 2)     Status: None (Preliminary result)   Collection Time: 07/08/22  4:40 PM   Specimen: Right Antecubital; Blood  Result Value Ref Range Status   Specimen Description RIGHT ANTECUBITAL  Final   Special Requests   Final    BOTTLES DRAWN AEROBIC AND ANAEROBIC Blood Culture results may not be optimal due to an inadequate volume of blood received in culture bottles   Culture   Final    NO GROWTH < 24  HOURS Performed at [facility], [address] [place], [place] [address]    Report Status PENDING  Incomplete  Blood Culture (routine x 2)     Status: None (Preliminary result)   Collection Time: 07/08/22  4:45 PM   Specimen: Right Antecubital; Blood  Result Value Ref Range Status   Specimen Description RIGHT ANTECUBITAL  Final   Special Requests   Final    BOTTLES DRAWN AEROBIC AND ANAEROBIC Blood Culture results may not be optimal due to an inadequate volume of blood received in culture bottles   Culture   Final    NO GROWTH < 24 HOURS Performed at [facility], [address], [place], [place] [address]    Report Status PENDING  Incomplete         Radiology Studies: CT Head Wo Contrast  Result Date: 07/08/2022 CLINICAL DATA:  Altered mental  status EXAM: CT HEAD WITHOUT CONTRAST TECHNIQUE: Contiguous axial images were obtained from the base of the skull through the vertex without intravenous contrast. RADIATION DOSE REDUCTION: This exam was performed according to the departmental dose-optimization program which includes automated exposure control, adjustment of the mA and/or kV according to patient size and/or use of iterative reconstruction technique. COMPARISON:  04/29/2022 FINDINGS: Brain: No acute intracranial findings are seen in noncontrast CT brain. There are no signs of bleeding within the cranium. Cortical sulci are prominent. Small old lacunar infarcts are seen in basal ganglia. There is decreased density in periventricular white matter. Vascular: Unremarkable. Skull: Unremarkable. Sinuses/Orbits: No acute findings are seen. Other: None. IMPRESSION: No acute intracranial findings are seen a noncontrast CT brain. Atrophy. 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 Electronically Signed   By: Elmer Picker M.D.   On: 07/08/2022 18:25   DG Chest Port 1 View  Result Date: 07/08/2022 CLINICAL DATA:  Altered mental status EXAM: PORTABLE CHEST 1 VIEW COMPARISON:  10/30/2017 FINDINGS: No acute airspace disease or effusion. Borderline cardiac enlargement. Aortic atherosclerosis. No pneumothorax. IMPRESSION: No active disease.  Borderline cardiomegaly Electronically Signed   By: Donavan Foil M.D.   On: 07/08/2022 17:11        Scheduled Meds:  apixaban  2.5 mg Oral BID   aspirin EC  81 mg Oral Daily   atorvastatin  40 mg Oral Daily   azelastine  1 spray Each Nare BID   baclofen  20 mg Oral BID   calcium-vitamin D  1 tablet Oral Daily   erythromycin  1 Application Right Eye TID   loratadine  10 mg Oral Daily   losartan  50 mg Oral BID   metoprolol tartrate  12.5 mg Oral BID   mirtazapine  15 mg Oral QHS   montelukast  10 mg Oral Daily   multivitamin with minerals  1 tablet Oral Q1500    multivitamin-lutein  1 capsule Oral BID   pantoprazole  40 mg Oral QAC breakfast   timolol  1 drop Both Eyes BID   Continuous Infusions:  sodium chloride 100 mL/hr at 07/08/22 2335   diltiazem (CARDIZEM) infusion 5 mg/hr (07/09/22 0722)     LOS: 1 day    Time spent: 35 mins    Wyvonnia Dusky, MD [facility] Pager [phone]  If 7PM-7AM, please contact night-coverage [email or web address] 07/09/2022, 8:30 AM

## 2022-07-10 ENCOUNTER — Observation Stay: Payer: Medicare HMO

## 2022-07-10 DIAGNOSIS — R531 Weakness: Secondary | ICD-10-CM | POA: Diagnosis not present

## 2022-07-10 DIAGNOSIS — I4891 Unspecified atrial fibrillation: Secondary | ICD-10-CM | POA: Diagnosis not present

## 2022-07-10 DIAGNOSIS — R413 Other amnesia: Secondary | ICD-10-CM | POA: Diagnosis not present

## 2022-07-10 DIAGNOSIS — R410 Disorientation, unspecified: Secondary | ICD-10-CM | POA: Diagnosis not present

## 2022-07-10 DIAGNOSIS — I480 Paroxysmal atrial fibrillation: Secondary | ICD-10-CM | POA: Diagnosis not present

## 2022-07-10 DIAGNOSIS — I10 Essential (primary) hypertension: Secondary | ICD-10-CM | POA: Diagnosis not present

## 2022-07-10 LAB — CBC
HCT: 35.7 % — ABNORMAL LOW (ref 36.0–46.0)
Hemoglobin: 12 g/dL (ref 12.0–15.0)
MCH: 31.8 pg (ref 26.0–34.0)
MCHC: 33.6 g/dL (ref 30.0–36.0)
MCV: 94.7 fL (ref 80.0–100.0)
Platelets: 282 10*3/uL (ref 150–400)
RBC: 3.77 MIL/uL — ABNORMAL LOW (ref 3.87–5.11)
RDW: 12.8 % (ref 11.5–15.5)
WBC: 14.3 10*3/uL — ABNORMAL HIGH (ref 4.0–10.5)
nRBC: 0 % (ref 0.0–0.2)

## 2022-07-10 LAB — BASIC METABOLIC PANEL
Anion gap: 3 — ABNORMAL LOW (ref 5–15)
BUN: 18 mg/dL (ref 8–23)
CO2: 29 mmol/L (ref 22–32)
Calcium: 8 mg/dL — ABNORMAL LOW (ref 8.9–10.3)
Chloride: 101 mmol/L (ref 98–111)
Creatinine, Ser: 0.56 mg/dL (ref 0.44–1.00)
GFR, Estimated: >60 mL/min (ref >60)
Glucose, Bld: 117 mg/dL — ABNORMAL HIGH (ref 70–99)
Potassium: 3.7 mmol/L (ref 3.5–5.1)
Sodium: 133 mmol/L — ABNORMAL LOW (ref 135–145)

## 2022-07-10 LAB — URINE CULTURE

## 2022-07-10 LAB — GLUCOSE, CAPILLARY: Glucose-Capillary: 135 mg/dL — ABNORMAL HIGH (ref 70–99)

## 2022-07-10 NOTE — Evaluation (Signed)
Physical Therapy Evaluation Patient Details Name: Mary Thornton MRN: [id number] DOB: 27-Dec-1940 Today's Date: 07/10/2022  History of Present Illness  Pt is an 81 y/o F who presented to the ER with c/o acute onset of generalized weakness & AMS over the last week. Pt is being treated for a-fib with RVR. PMH: asthma, OA, diverticulosis, GERD, HTN, dyslipidemia, PSVT, GERD  Clinical Impression  Pt seen for PT evaluation with family present at beginning & end of session. Family is oriented to self & month/year but reports we're in a funeral home. PT provides assistance for orientation. Prior to admission pt was independent with all aspects of mobility, driving, cooking & cleaning without use of AD. On this date, pt requires CGA & HOB elevated to transfer supine>sit, STS with min assist HHA & stand pivot with mod assist HHA. Pt demonstrates decreased BLE strength & impaired balance. Pt is currently a high fall risk. Pt also incontinent of BM & unaware. Educated daughter on recommendation of STR upon d/c.     Recommendations for follow up therapy are one component of a multi-disciplinary discharge planning process, led by the attending physician.  Recommendations may be updated based on patient status, additional functional criteria and insurance authorization.  Follow Up Recommendations Skilled nursing-short term rehab (<3 hours/day) Can patient physically be transported by private vehicle: No    Assistance Recommended at Discharge Frequent or constant Supervision/Assistance  Patient can return home with the following  A lot of help with walking and/or transfers;A lot of help with bathing/dressing/bathroom;Assist for transportation;Assistance with cooking/housework;Help with stairs or ramp for entrance;Direct supervision/assist for medications management    Equipment Recommendations None recommended by PT (TBD in next venue)  Recommendations for Other Services       Functional Status  Assessment Patient has had a recent decline in their functional status and demonstrates the ability to make significant improvements in function in a reasonable and predictable amount of time.     Precautions / Restrictions Precautions Precautions: Fall Restrictions Weight Bearing Restrictions: No      Mobility  Bed Mobility Overal bed mobility: Needs Assistance Bed Mobility: Supine to Sit     Supine to sit: Min guard, HOB elevated     General bed mobility comments: extra time & cuing to transfer to sitting EOB    Transfers Overall transfer level: Needs assistance Equipment used: 1 person hand held assist Transfers: Sit to/from Stand, Bed to chair/wheelchair/BSC Sit to Stand: Min assist   Step pivot transfers: Mod assist            Ambulation/Gait                  Stairs            Wheelchair Mobility    Modified Rankin (Stroke Patients Only)       Balance Overall balance assessment: Needs assistance Sitting-balance support: Feet supported Sitting balance-Leahy Scale: Fair Sitting balance - Comments: supervision static sitting EOB   Standing balance support: During functional activity, Bilateral upper extremity supported Standing balance-Leahy Scale: Poor                               Pertinent Vitals/Pain Pain Assessment Pain Assessment: Faces Faces Pain Scale: No hurt    Home Living Family/patient expects to be discharged to:: Private residence Living Arrangements: Alone Available Help at Discharge: Family;Available PRN/intermittently Type of Home: House Home Access: Level entry  Home Layout: One level Home Equipment: Grab bars - tub/shower;Cane - single Barista (2 wheels);Hand held shower head Additional Comments: takes a bath uses grab bars to get up    Prior Function Prior Level of Function : Independent/Modified Independent             Mobility Comments: Reports she's ambulatory without  AD, denies falls but family reports 1 recent one. Pt reports she was independent, driving, cooking cleaning. ADLs Comments: up until two weeks ago was driving, grocery shopping,     Hand Dominance        Extremity/Trunk Assessment   Upper Extremity Assessment Upper Extremity Assessment: Generalized weakness    Lower Extremity Assessment Lower Extremity Assessment: Generalized weakness    Cervical / Trunk Assessment Cervical / Trunk Assessment: Kyphotic  Communication   Communication: No difficulties  Cognition Arousal/Alertness: Awake/alert Behavior During Therapy: WFL for tasks assessed/performed Overall Cognitive Status: Impaired/Different from baseline Area of Impairment: Orientation, Attention, Memory, Following commands, Safety/judgement, Awareness, Problem solving                 Orientation Level: Disoriented to, Place, Situation (oriented to month & year, reports she is in funeral home)   Memory: Decreased short-term memory Following Commands: Follows one step commands with increased time Safety/Judgement: Decreased awareness of safety, Decreased awareness of deficits Awareness: Intellectual Problem Solving: Slow processing, Difficulty sequencing, Requires verbal cues, Requires tactile cues General Comments: Pleasantly confused, family in rooms reports pt's cognition is worse than baseline        General Comments General comments (skin integrity, edema, etc.): assisted pt with changing gown 2/2 spilled liquid, pt noted to have incontinent BM & required total assist for hygiene care    Exercises     Assessment/Plan    PT Assessment Patient needs continued PT services  PT Problem List Decreased strength;Decreased coordination;Decreased cognition;Decreased activity tolerance;Decreased balance;Decreased mobility;Decreased knowledge of precautions;Decreased safety awareness;Decreased knowledge of use of DME       PT Treatment Interventions DME  instruction;Balance training;Therapeutic exercise;Gait training;Stair training;Neuromuscular re-education;Cognitive remediation;Patient/family education;Functional mobility training;Therapeutic activities    PT Goals (Current goals can be found in the Care Plan section)  Acute Rehab PT Goals Patient Stated Goal: get better PT Goal Formulation: With patient/family Time For Goal Achievement: 07/24/22 Potential to Achieve Goals: Good    Frequency Min 2X/week     Co-evaluation               AM-PAC PT "6 Clicks" Mobility  Outcome Measure Help needed turning from your back to your side while in a flat bed without using bedrails?: A Little Help needed moving from lying on your back to sitting on the side of a flat bed without using bedrails?: A Little Help needed moving to and from a bed to a chair (including a wheelchair)?: A Lot Help needed standing up from a chair using your arms (e.g., wheelchair or bedside chair)?: A Little Help needed to walk in hospital room?: A Lot Help needed climbing 3-5 steps with a railing? : Total 6 Click Score: 14    End of Session   Activity Tolerance: Patient tolerated treatment well Patient left: in chair;with chair alarm set;with call bell/phone within reach;with nursing/sitter in room;with family/visitor present Nurse Communication: Mobility status PT Visit Diagnosis: Unsteadiness on feet (R26.81);Muscle weakness (generalized) (M62.81);Difficulty in walking, not elsewhere classified (R26.2)    Time: 1696-7893 PT Time Calculation (min) (ACUTE ONLY): 13 min   Charges:   PT Evaluation $PT Eval Moderate  Complexity: Harrison, DPT 07/10/22, 1:21 PM   Waunita Schooner 07/10/2022, 1:19 PM

## 2022-07-10 NOTE — Evaluation (Addendum)
Occupational Therapy Evaluation Patient Details Name: Mary Thornton MRN: [id number] DOB: Sep 26, 1941 Today's Date: 07/10/2022   History of Present Illness Pt is an 81 year old female admitted with weakness and fatigue, AMS; PMH significant for asthma, osteoarthritis, diverticulosis, GERD, hypertension, dyslipidemia and PSVT as well as GERD   Clinical Impression   Chart reviewed, pt greeted in room with daughter present. Pt is oriented to self only, poor awareness of deficits/safety. PTA pt was amb with no AD, performing all ADL/IADL with MOD I (use of DME as needed). Pt presents with deficits in strength, endurance, activity tolerance, balance, cognition all affecting safe and optimal ADL completion. Significant posterior lean noted in standing with physical assist required all standing tasks. Pt is performing all ADL, cognition is below PLOF. At his time recommend discharge to STR to address functional deficits and to facilitate return to PLOF.   HR up to 114 with mobility.     Recommendations for follow up therapy are one component of a multi-disciplinary discharge planning process, led by the attending physician.  Recommendations may be updated based on patient status, additional functional criteria and insurance authorization.   Follow Up Recommendations  Skilled nursing-short term rehab (<3 hours/day)    Assistance Recommended at Discharge Frequent or constant Supervision/Assistance  Patient can return home with the following A lot of help with walking and/or transfers;A lot of help with bathing/dressing/bathroom    Functional Status Assessment  Patient has had a recent decline in their functional status and demonstrates the ability to make significant improvements in function in a reasonable and predictable amount of time.  Equipment Recommendations  Tub/shower seat;BSC/3in1    Recommendations for Other Services       Precautions / Restrictions Precautions Precautions:  Fall Restrictions Weight Bearing Restrictions: No      Mobility Bed Mobility Overal bed mobility: Needs Assistance Bed Mobility: Supine to Sit, Sit to Supine     Supine to sit: Min assist, HOB elevated Sit to supine: Min assist, HOB elevated        Transfers Overall transfer level: Needs assistance Equipment used: Rolling walker (2 wheels) Transfers: Sit to/from Stand Sit to Stand: Mod assist, +2 safety/equipment                  Balance Overall balance assessment: Needs assistance Sitting-balance support: Feet supported Sitting balance-Leahy Scale: Fair   Postural control: Posterior lean Standing balance support: During functional activity, Bilateral upper extremity supported Standing balance-Leahy Scale: Poor                             ADL either performed or assessed with clinical judgement   ADL Overall ADL's : Needs assistance/impaired                     Lower Body Dressing: Maximal assistance;Sit to/from stand   Toilet Transfer: Maximal assistance Toilet Transfer Details (indicate cue type and reason): simulated with RW Toileting- Clothing Manipulation and Hygiene: Maximal assistance       Functional mobility during ADLs: Moderate assistance;Rolling walker (2 wheels);+2 for safety/equipment (no more than 4')       Vision Patient Visual Report: No change from baseline       Perception     Praxis      Pertinent Vitals/Pain Pain Assessment Pain Assessment: No/denies pain     Hand Dominance     Extremity/Trunk Assessment Upper Extremity Assessment Upper Extremity Assessment: Generalized  weakness (3+/5 strength throughout, midly impaired FMC/dexterity)   Lower Extremity Assessment Lower Extremity Assessment: Generalized weakness (3+/5 strength throughout, midly impaired FMC/dexterity)   Cervical / Trunk Assessment Cervical / Trunk Assessment: Kyphotic   Communication Communication Communication: No  difficulties   Cognition Arousal/Alertness: Awake/alert Behavior During Therapy: Flat affect Overall Cognitive Status: Impaired/Different from baseline Area of Impairment: Orientation, Attention, Memory, Following commands, Safety/judgement, Awareness, Problem solving                 Orientation Level: Disoriented to, Place, Time, Situation Current Attention Level: Focused Memory: Decreased short-term memory Following Commands: Follows one step commands with increased time Safety/Judgement: Decreased awareness of safety, Decreased awareness of deficits Awareness: Intellectual Problem Solving: Slow processing, Difficulty sequencing, Requires verbal cues, Requires tactile cues General Comments: daugther in room reports this is not pt baseline     General Comments  vital sign stable throughout    Exercises Other Exercises Other Exercises: edu pt and daugther re: role of OT, role of rehab, discharge recommendations, home safety, DME use, falls prevention   Shoulder Instructions      Home Living Family/patient expects to be discharged to:: Private residence Living Arrangements: Alone Available Help at Discharge: Family;Available PRN/intermittently Type of Home: House Home Access: Level entry     Home Layout: One level     Bathroom Shower/Tub: Teacher, early years/pre: Handicapped height     Home Equipment: Grab bars - tub/shower;Cane - single Barista (2 wheels);Hand held shower head   Additional Comments: takes a bath uses grab bars to get up      Prior Functioning/Environment Prior Level of Function : Independent/Modified Independent             Mobility Comments: amb no AD, one fall the last month ADLs Comments: up until two weeks ago was driving, grocery shopping,        OT Problem List: Decreased strength;Decreased activity tolerance;Decreased knowledge of use of DME or AE;Decreased safety awareness;Decreased cognition;Decreased  knowledge of precautions      OT Treatment/Interventions: Self-care/ADL training;Patient/family education;Therapeutic exercise;Balance training;Energy conservation;Therapeutic activities;DME and/or AE instruction    OT Goals(Current goals can be found in the care plan section) Acute Rehab OT Goals Patient Stated Goal: get out of bed OT Goal Formulation: With patient Time For Goal Achievement: 07/24/22 Potential to Achieve Goals: Good ADL Goals Pt Will Perform Grooming: sitting;standing;with supervision Pt Will Transfer to Toilet: with supervision;ambulating Pt Will Perform Toileting - Clothing Manipulation and hygiene: with supervision;sit to/from stand Pt Will Perform Tub/Shower Transfer: with supervision  OT Frequency: Min 2X/week    Co-evaluation              AM-PAC OT "6 Clicks" Daily Activity     Outcome Measure Help from another person eating meals?: A Little Help from another person taking care of personal grooming?: A Little Help from another person toileting, which includes using toliet, bedpan, or urinal?: A Lot Help from another person bathing (including washing, rinsing, drying)?: A Lot Help from another person to put on and taking off regular upper body clothing?: A Little Help from another person to put on and taking off regular lower body clothing?: A Lot 6 Click Score: 15   End of Session Equipment Utilized During Treatment: Gait belt;Rolling walker (2 wheels) Nurse Communication: Mobility status  Activity Tolerance: Patient tolerated treatment well Patient left: in bed;with call bell/phone within reach;with bed alarm set  OT Visit Diagnosis: Unsteadiness on feet (R26.81);History of falling (Z91.81);Muscle  weakness (generalized) (M62.81)                Time: 0950-1005 OT Time Calculation (min): 15 min Charges:  OT General Charges $OT Visit: 1 Visit OT Evaluation $OT Eval Moderate Complexity: 1 Mod  Shanon Payor, OTD OTR/L  07/10/22, 1:15 PM

## 2022-07-10 NOTE — Progress Notes (Signed)
[facility] Encounter Note  Patient: Mary Thornton / Admit Date: 07/08/2022 / Date of Encounter: 07/10/2022, 2:35 PM   Subjective: Patient overall feels slightly better than she did yesterday. She is a little more perky and has had no evidence of chest pain weakness fatigue palpitations or irregular heartbeat.  There has been no evidence of atrial fibrillation this admission based on current telemetry and/or EKG.  The patient did have a short run of supraventricular tachycardia about 10-12 beats.  The patient has been weak and fatigued and does have slightly elevated white blood cell count and a heart murmur.  There are some minor criteria for the possibility of endocarditis, although no major criteria.  The patient has not had any blood cultures and may be reasonable to pursue that at this time.  Echocardiogram may be reasonable as well to assess for heart murmur.  Review of Systems: Positive for: Weakness Negative for: Vision change, hearing change, syncope, dizziness, nausea, vomiting,diarrhea, bloody stool, stomach pain, cough, congestion, diaphoresis, urinary frequency, urinary pain,skin lesions, skin rashes Others previously listed  Objective: Telemetry: Normal sinus rhythm Physical Exam: Blood pressure 126/74, pulse 84, temperature 98.2 F (36.8 C), temperature source Axillary, resp. rate 20, height '5\' 3"'$  (1.6 m), weight 49 kg, SpO2 97 %. Body mass index is 19.14 kg/m. General: Well developed, well nourished, in no acute distress. Head: Normocephalic, atraumatic, sclera non-icteric, no xanthomas, nares are without discharge. Neck: No apparent masses Lungs: Normal respirations with no wheezes, no rhonchi, no rales , no crackles   Heart: Regular rate and rhythm, normal S1 S2, 2+ apical murmur, no rub, no gallop, PMI is normal size and placement, carotid upstroke normal without bruit, jugular venous pressure normal Abdomen: Soft, non-tender, non-distended with  normoactive bowel sounds. No hepatosplenomegaly. Abdominal aorta is normal size without bruit Extremities: No edema, no clubbing, no cyanosis, no ulcers,  Peripheral: 2+ radial, 2+ femoral, 2+ dorsal pedal pulses Neuro: Alert and oriented. Moves all extremities spontaneously. Psych:  Responds to questions appropriately with a normal affect.   Intake/Output Summary (Last 24 hours) at 07/10/2022 1435 Last data filed at 07/10/2022 1130 Gross per 24 hour  Intake 854.54 ml  Output 2125 ml  Net -1270.46 ml    Inpatient Medications:   apixaban  2.5 mg Oral BID   aspirin EC  81 mg Oral Daily   azelastine  1 spray Each Nare BID   baclofen  20 mg Oral BID   calcium-vitamin D  1 tablet Oral Daily   erythromycin  1 Application Right Eye TID   loratadine  10 mg Oral Daily   losartan  50 mg Oral BID   metoprolol tartrate  12.5 mg Oral BID   mirtazapine  15 mg Oral QHS   montelukast  10 mg Oral Daily   multivitamin with minerals  1 tablet Oral Q1500   multivitamin-lutein  1 capsule Oral BID   pantoprazole  40 mg Oral QAC breakfast   timolol  1 drop Both Eyes BID   Infusions:   Labs: Recent Labs    07/09/22 0535 07/09/22 2045 07/10/22 0451  NA 137  --  133*  K 3.8  --  3.7  CL 104  --  101  CO2 25  --  29  GLUCOSE 119*  --  117*  BUN 12  --  18  CREATININE 0.51  --  0.56  CALCIUM 8.6*  --  8.0*  MG  --  2.4  --  Recent Labs    07/08/22 1637  AST 32  ALT 34  ALKPHOS 69  BILITOT 0.7  PROT 6.3*  ALBUMIN 4.2   Recent Labs    07/08/22 1637 07/09/22 0535 07/10/22 0451  WBC 9.7 11.6* 14.3*  NEUTROABS 7.6  --   --   HGB 13.0 12.8 12.0  HCT 39.0 38.2 35.7*  MCV 95.6 95.3 94.7  PLT 319 293 282   No results for input(s): "CKTOTAL", "CKMB", "TROPONINI" in the last 72 hours. Invalid input(s): "POCBNP" No results for input(s): "HGBA1C" in the last 72 hours.   Weights: Filed Weights   07/08/22 1700 07/08/22 2152  Weight: 47.6 kg 49 kg     Radiology/Studies:  MR  BRAIN WO CONTRAST  Result Date: 07/10/2022 CLINICAL DATA:  Delirium EXAM: MRI HEAD WITHOUT CONTRAST TECHNIQUE: Multiplanar, multiecho pulse sequences of the brain and surrounding structures were obtained without intravenous contrast. COMPARISON:  Head CT from 2 days ago FINDINGS: Brain: No acute infarction, hemorrhage, hydrocephalus, extra-axial collection or mass lesion. Chronic small vessel ischemia in the cerebral white matter, mild for age. Mild cerebral volume loss for age. Vascular: Normal flow voids. Skull and upper cervical spine: Normal marrow signal. Sinuses/Orbits: Right cataract resection. Other: Intermittent motion artifact, likely best obtainable. IMPRESSION: 1. Aging brain without acute or reversible finding. 2. Motion degraded. Electronically Signed   By: Jorje Guild M.D.   On: 07/10/2022 12:52   CT Head Wo Contrast  Result Date: 07/08/2022 CLINICAL DATA:  Altered mental status EXAM: CT HEAD WITHOUT CONTRAST TECHNIQUE: Contiguous axial images were obtained from the base of the skull through the vertex without intravenous contrast. RADIATION DOSE REDUCTION: This exam was performed according to the departmental dose-optimization program which includes automated exposure control, adjustment of the mA and/or kV according to patient size and/or use of iterative reconstruction technique. COMPARISON:  04/29/2022 FINDINGS: Brain: No acute intracranial findings are seen in noncontrast CT brain. There are no signs of bleeding within the cranium. Cortical sulci are prominent. Small old lacunar infarcts are seen in basal ganglia. There is decreased density in periventricular white matter. Vascular: Unremarkable. Skull: Unremarkable. Sinuses/Orbits: No acute findings are seen. Other: None. IMPRESSION: No acute intracranial findings are seen a noncontrast CT brain. Atrophy. 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 Electronically Signed   By: Elmer Picker M.D.   On:  07/08/2022 18:25   DG Chest Port 1 View  Result Date: 07/08/2022 CLINICAL DATA:  Altered mental status EXAM: PORTABLE CHEST 1 VIEW COMPARISON:  10/30/2017 FINDINGS: No acute airspace disease or effusion. Borderline cardiac enlargement. Aortic atherosclerosis. No pneumothorax. IMPRESSION: No active disease.  Borderline cardiomegaly Electronically Signed   By: Donavan Foil M.D.   On: 07/08/2022 17:11     Assessment and Recommendation  81 y.o. female with no evidence of previous cardiovascular history with hypertension and apparent previous history of supraventricular tachycardia coming in with weakness and fatigue and no current evidence of congestive heart failure and/or non-ST elevation myocardial infarction with heart murmur and minor criteria for endocarditis with no evidence of major criteria.  The patient has had supraventricular tachycardia but no definite atrial fibrillation 1.  Continue medication management for maintenance of normal sinus rhythm and reduction of evidence of supraventricular tachycardia including metoprolol 2.  Continue to pursue primary causes of weakness and fatigue and slight elevation of white blood cell count including the possibility of endocarditis with blood cultures if necessary 3.  Echocardiogram for LV systolic dysfunction valvular  heart disease to assess for above 4.  Further treatment options after above  Signed, Serafina Royals M.D. FACC

## 2022-07-10 NOTE — Plan of Care (Signed)

## 2022-07-10 NOTE — Progress Notes (Signed)
PROGRESS NOTE    Mary Thornton  XHB:[id number] DOB: 02-19-1941 DOA: 07/08/2022 PCP: Idelle Crouch, MD   Assessment & Plan:   Principal Problem:   Atrial fibrillation with RVR [facility]) Active Problems:   Essential hypertension   Dyslipidemia   GERD without esophagitis   Coronary artery disease  Assessment and Plan: A. fib: w/ RVR. New onset. Continue on metoprolol, eliquis & d/c po diltiazem as per cardio. Cardio recs apprec  CAD: w/ elevated troponins. Minimally elevated troponins are secondary to demand ischemia   GERD: continue on PPI    HLD: continue on statin   HTN: continue on metoprolol, losartan. D/c diltiazem as per cardio    Generalized weakness: PT/OT recs apprec  Memory deficit: w/ likely sundowning. No formal dx of dementia. CT head & MRI brain shows no acute intracranial findings. Pt evidently repeating stories at home as per pt's daughters. Urine cx shows containment and repeat urine cx is pending. Blood cxs NGTD     DVT prophylaxis: eliquis  Code Status: full  Family Communication: discussed pt's care w/ pt's family at bedside and answered their questions Disposition Plan: likely d/c to SNF   Level of care: Progressive  Status is: Inpatient Remains inpatient appropriate because: severity of illness    Consultants:  Cardio, Dr. Nehemiah Massed   Procedures:   Antimicrobials:   Subjective: Pt c/o fatigue   Objective: Vitals:   07/09/22 2020 07/09/22 2324 07/10/22 0348 07/10/22 0726  BP: (!) 160/100 (!) 105/59 120/79 131/75  Pulse: (!) 126 94 91 94  Resp:  '18 16 17  '$ Temp:  (!) 97.4 F (36.3 C) (!) 97.4 F (36.3 C) 98.5 F (36.9 C)  TempSrc:    Oral  SpO2:  98% 97% 97%  Weight:      Height:        Intake/Output Summary (Last 24 hours) at 07/10/2022 0821 Last data filed at 07/10/2022 0808 Gross per 24 hour  Intake 374.54 ml  Output 3225 ml  Net -2850.46 ml   Filed Weights   07/08/22 1700 07/08/22 2152  Weight: 47.6 kg 49 kg     Examination:  General exam: Appears comfortable. Frail appearing  Respiratory system: clear breath sounds b/l  Cardiovascular system: irregularly irregular Gastrointestinal system: Abd is soft, NT, ND & normal bowel sounds  Central nervous system: Alert & oriented to person, place & year. Moves all extremities  Psychiatry: Judgement and insight appears poor. Flat mood and affect    Data Reviewed: I have personally reviewed following labs and imaging studies  CBC: Recent Labs  Lab 07/08/22 1637 07/09/22 0535 07/10/22 0451  WBC 9.7 11.6* 14.3*  NEUTROABS 7.6  --   --   HGB 13.0 12.8 12.0  HCT 39.0 38.2 35.7*  MCV 95.6 95.3 94.7  PLT 319 293 810   Basic Metabolic Panel: Recent Labs  Lab 07/08/22 1637 07/09/22 0535 07/09/22 2045 07/10/22 0451  NA 136 137  --  133*  K 4.6 3.8  --  3.7  CL 103 104  --  101  CO2 24 25  --  29  GLUCOSE 120* 119*  --  117*  BUN 14 12  --  18  CREATININE 0.62 0.51  --  0.56  CALCIUM 8.3* 8.6*  --  8.0*  MG  --   --  2.4  --    GFR: Estimated Creatinine Clearance: 42.7 mL/min (by C-G formula based on SCr of 0.56 mg/dL). Liver Function Tests: Recent Labs  Lab  07/08/22 1637  AST 32  ALT 34  ALKPHOS 69  BILITOT 0.7  PROT 6.3*  ALBUMIN 4.2   No results for input(s): "LIPASE", "AMYLASE" in the last 168 hours. No results for input(s): "AMMONIA" in the last 168 hours. Coagulation Profile: Recent Labs  Lab 07/08/22 1637 07/09/22 0535  INR 1.1 1.2   Cardiac Enzymes: No results for input(s): "CKTOTAL", "CKMB", "CKMBINDEX", "TROPONINI" in the last 168 hours. BNP (last 3 results) No results for input(s): "PROBNP" in the last 8760 hours. HbA1C: No results for input(s): "HGBA1C" in the last 72 hours. CBG: Recent Labs  Lab 07/10/22 0740  GLUCAP 135*   Lipid Profile: No results for input(s): "CHOL", "HDL", "LDLCALC", "TRIG", "CHOLHDL", "LDLDIRECT" in the last 72 hours. Thyroid Function Tests: Recent Labs    07/08/22 1637   TSH 0.696  FREET4 1.55*   Anemia Panel: No results for input(s): "VITAMINB12", "FOLATE", "FERRITIN", "TIBC", "IRON", "RETICCTPCT" in the last 72 hours. Sepsis Labs: Recent Labs  Lab 07/08/22 1637 07/08/22 1906  LATICACIDVEN 1.1 1.1    Recent Results (from the past 240 hour(s))  Resp Panel by RT-PCR (Flu A&B, Covid) Anterior Nasal Swab     Status: None   Collection Time: 07/08/22  4:37 PM   Specimen: Anterior Nasal Swab  Result Value Ref Range Status   SARS Coronavirus 2 by RT PCR NEGATIVE NEGATIVE Final    Comment: (NOTE) SARS-CoV-2 target nucleic acids are NOT DETECTED.  The SARS-CoV-2 RNA is generally detectable in upper respiratory specimens during the acute phase of infection. The lowest concentration of SARS-CoV-2 viral copies this assay can detect is 138 copies/mL. A negative result does not preclude SARS-Cov-2 infection and should not be used as the sole basis for treatment or other patient management decisions. A negative result may occur with  improper specimen collection/handling, submission of specimen other than nasopharyngeal swab, presence of viral mutation(s) within the areas targeted by this assay, and inadequate number of viral copies(<138 copies/mL). A negative result must be combined with clinical observations, patient history, and epidemiological information. The expected result is Negative.  Fact Sheet for Patients:  EntrepreneurPulse.com.au  Fact Sheet for Healthcare Providers:  IncredibleEmployment.be  This test is no t yet approved or cleared by the [place] FDA and  has been authorized for detection and/or diagnosis of SARS-CoV-2 by FDA under an Emergency Use Authorization (EUA). This EUA will remain  in effect (meaning this test can be used) for the duration of the COVID-19 declaration under Section 564(b)(1) of the Act, 21 U.S.C.section 360bbb-3(b)(1), unless the authorization is terminated  or revoked  sooner.       Influenza A by PCR NEGATIVE NEGATIVE Final   Influenza B by PCR NEGATIVE NEGATIVE Final    Comment: (NOTE) The Xpert Xpress SARS-CoV-2/FLU/RSV plus assay is intended as an aid in the diagnosis of influenza from Nasopharyngeal swab specimens and should not be used as a sole basis for treatment. Nasal washings and aspirates are unacceptable for Xpert Xpress SARS-CoV-2/FLU/RSV testing.  Fact Sheet for Patients: EntrepreneurPulse.com.au  Fact Sheet for Healthcare Providers: IncredibleEmployment.be  This test is not yet approved or cleared by the [place] FDA and has been authorized for detection and/or diagnosis of SARS-CoV-2 by FDA under an Emergency Use Authorization (EUA). This EUA will remain in effect (meaning this test can be used) for the duration of the COVID-19 declaration under Section 564(b)(1) of the Act, 21 U.S.C. section 360bbb-3(b)(1), unless the authorization is terminated or revoked.  Performed at [facility], [address], [place], [place] [address]   Blood Culture (routine x 2)     Status: None (Preliminary result)   Collection Time: 07/08/22  4:40 PM   Specimen: Right Antecubital; Blood  Result Value Ref Range Status   Specimen Description RIGHT ANTECUBITAL  Final   Special Requests   Final    BOTTLES DRAWN AEROBIC AND ANAEROBIC Blood Culture results may not be optimal due to an inadequate volume of blood received in culture bottles   Culture   Final    NO GROWTH 2 DAYS Performed at [facility], [address], [place], [place] [address]    Report Status PENDING  Incomplete  Blood Culture (routine x 2)     Status: None (Preliminary result)   Collection Time: 07/08/22  4:45 PM   Specimen: Right Antecubital; Blood  Result Value Ref Range Status   Specimen Description RIGHT ANTECUBITAL  Final   Special Requests   Final    BOTTLES DRAWN AEROBIC AND ANAEROBIC Blood Culture results  may not be optimal due to an inadequate volume of blood received in culture bottles   Culture   Final    NO GROWTH 2 DAYS Performed at [facility], [address] [place], [place] [address]    Report Status PENDING  Incomplete         Radiology Studies: CT Head Wo Contrast  Result Date: 07/08/2022 CLINICAL DATA:  Altered mental status EXAM: CT HEAD WITHOUT CONTRAST TECHNIQUE: Contiguous axial images were obtained from the base of the skull through the vertex without intravenous contrast. RADIATION DOSE REDUCTION: This exam was performed according to the departmental dose-optimization program which includes automated exposure control, adjustment of the mA and/or kV according to patient size and/or use of iterative reconstruction technique. COMPARISON:  04/29/2022 FINDINGS: Brain: No acute intracranial findings are seen in noncontrast CT brain. There are no signs of bleeding within the cranium. Cortical sulci are prominent. Small old lacunar infarcts are seen in basal ganglia. There is decreased density in periventricular white matter. Vascular: Unremarkable. Skull: Unremarkable. Sinuses/Orbits: No acute findings are seen. Other: None. IMPRESSION: No acute intracranial findings are seen a noncontrast CT brain. Atrophy. 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 Electronically Signed   By: Elmer Picker M.D.   On: 07/08/2022 18:25   DG Chest Port 1 View  Result Date: 07/08/2022 CLINICAL DATA:  Altered mental status EXAM: PORTABLE CHEST 1 VIEW COMPARISON:  10/30/2017 FINDINGS: No acute airspace disease or effusion. Borderline cardiac enlargement. Aortic atherosclerosis. No pneumothorax. IMPRESSION: No active disease.  Borderline cardiomegaly Electronically Signed   By: Donavan Foil M.D.   On: 07/08/2022 17:11        Scheduled Meds:  apixaban  2.5 mg Oral BID   aspirin EC  81 mg Oral Daily   azelastine  1 spray Each Nare BID   baclofen  20 mg Oral  BID   calcium-vitamin D  1 tablet Oral Daily   erythromycin  1 Application Right Eye TID   loratadine  10 mg Oral Daily   losartan  50 mg Oral BID   metoprolol tartrate  12.5 mg Oral BID   mirtazapine  15 mg Oral QHS   montelukast  10 mg Oral Daily   multivitamin with minerals  1 tablet Oral Q1500   multivitamin-lutein  1 capsule Oral BID   pantoprazole  40 mg Oral QAC breakfast   timolol  1 drop Both Eyes BID  Continuous Infusions:     LOS: 1 day    Time spent: 36 mins    Wyvonnia Dusky, MD [facility] Pager [phone]  If 7PM-7AM, please contact night-coverage [email or web address] 07/10/2022, 8:21 AM

## 2022-07-10 NOTE — TOC Progression Note (Signed)
Transition of Care [facility]) - Progression Note    Patient Details  Name: Mary Thornton MRN: [id number] Date of Birth: November 14, 1940  Transition of Care [facility]) CM/SW Contact  [name], RN Phone Number: 07/10/2022, 5:11 PM  Clinical Narrative: 9/10: New PASRR #[id number] A. FL2 completed. Left VM for son to call back re STR/SNF choices. 430 pm. [name] RN CM   511 pm. [name], returned call. Discussed choices and Medicare.gov website. Also gave permission to start bed search around [place] and [place], [place] while he and his brother review choices from website link given. Submitted bed search. [name] RN CM    [place],[name]  [phone] (Mobile      Expected Discharge Plan and Services                                                 Social Determinants of Health (SDOH) Interventions    Readmission Risk Interventions     No data to display

## 2022-07-10 NOTE — NC FL2 (Signed)
[place] LEVEL OF CARE SCREENING TOOL     IDENTIFICATION  Patient Name: Mary Thornton Birthdate: 28-Apr-1941 Sex: female Admission Date (Current Location): 07/08/2022  [facility] and [place] Number:  Engineering geologist and Address:  [facility], [address], [place], [place] [address]      Provider Number: [id number]  Attending Physician Name and Address:  Wyvonnia Dusky, MD  Relative Name and Phone Number:  Shanicka, [place])   [phone] (Mobile    Current Level of Care: Hospital Recommended Level of Care: [place] Prior Approval Number:    Date Approved/Denied: 07/10/22 PASRR Number: [id number]  Discharge Plan: SNF    Current Diagnoses: Patient Active Problem List   Diagnosis Date Noted   Atrial fibrillation with RVR ([place]) 07/08/2022   Essential hypertension 07/08/2022   Dyslipidemia 07/08/2022   GERD without esophagitis 07/08/2022   Coronary artery disease 07/08/2022   Lumbar radiculopathy 08/22/2018   Chest pain 10/30/2017   Cystocele 06/15/2016   Vaginal atrophy 06/15/2016   Unstable bladder 06/15/2016   ARF (acute renal failure) ([place]) 05/14/2016    Orientation RESPIRATION BLADDER Height & Weight     Self  Normal Incontinent Weight: 49 kg Height:  '5\' 3"'$  (160 cm)  BEHAVIORAL SYMPTOMS/MOOD NEUROLOGICAL BOWEL NUTRITION STATUS      Incontinent Diet  AMBULATORY STATUS COMMUNICATION OF NEEDS Skin   Extensive Assist Verbally Bruising (Right Hip per RN assessment notes.)                       Personal Care Assistance Level of Assistance  Bathing, Feeding, Dressing Bathing Assistance: Maximum assistance Feeding assistance: Limited assistance Dressing Assistance: Maximum assistance     Functional Limitations Info             SPECIAL CARE FACTORS FREQUENCY  PT (By licensed PT), OT (By licensed OT)     PT Frequency: 5x/week OT Frequency: 5x/week            Contractures  Contractures Info: Not present    Additional Factors Info  Code Status, Allergies Code Status Info: Full Code Allergies Info: pencillins, Sulfa Drugs, chorhexadine, ibubrofen, Cymbalta           Current Medications (07/10/2022):  This is the current hospital active medication list Current Facility-Administered Medications  Medication Dose Route Frequency Provider Last Rate Last Admin   acetaminophen (TYLENOL) tablet 650 mg  650 mg Oral Q6H PRN Mansy, Jan A, MD       Or   acetaminophen (TYLENOL) suppository 650 mg  650 mg Rectal Q6H PRN Mansy, Arvella Merles, MD       ALPRAZolam Duanne Moron) tablet 0.25 mg  0.25 mg Oral BID PRN Wyvonnia Dusky, MD   0.25 mg at 07/09/22 1551   apixaban (ELIQUIS) tablet 2.5 mg  2.5 mg Oral BID Mansy, Jan A, MD   2.5 mg at 07/10/22 1035   aspirin EC tablet 81 mg  81 mg Oral Daily Mansy, Jan A, MD   81 mg at 07/10/22 1034   azelastine (ASTELIN) 0.1 % nasal spray 1 spray  1 spray Each Nare BID Mansy, Jan A, MD   1 spray at 07/10/22 1036   baclofen (LIORESAL) tablet 20 mg  20 mg Oral BID Mansy, Jan A, MD   20 mg at 07/10/22 1036   calcium-vitamin D (OSCAL WITH D) 500-5 MG-MCG per tablet 1 tablet  1 tablet Oral Daily Mansy, Arvella Merles, MD  1 tablet at 07/10/22 1035   erythromycin ophthalmic ointment 1 Application  1 Application Right Eye TID Renda Rolls, RPH   1 Application at 42/70/62 1037   loratadine (CLARITIN) tablet 10 mg  10 mg Oral Daily Mansy, Jan A, MD   10 mg at 07/10/22 1034   losartan (COZAAR) tablet 50 mg  50 mg Oral BID Mansy, Jan A, MD   50 mg at 07/10/22 1036   magnesium hydroxide (MILK OF MAGNESIA) suspension 30 mL  30 mL Oral Daily PRN Mansy, Jan A, MD       metoprolol tartrate (LOPRESSOR) tablet 12.5 mg  12.5 mg Oral BID Mansy, Jan A, MD   12.5 mg at 07/10/22 1035   mirtazapine (REMERON) tablet 15 mg  15 mg Oral QHS Mansy, Jan A, MD   15 mg at 07/09/22 2048   montelukast (SINGULAIR) tablet 10 mg  10 mg Oral Daily Mansy, Jan A, MD   10 mg at 07/10/22 1036    multivitamin with minerals tablet 1 tablet  1 tablet Oral Q1500 Mansy, Jan A, MD   1 tablet at 07/09/22 1550   multivitamin-lutein (OCUVITE-LUTEIN) capsule 1 capsule  1 capsule Oral BID Mansy, Jan A, MD   1 capsule at 07/10/22 1036   ondansetron (ZOFRAN) tablet 4 mg  4 mg Oral Q6H PRN Mansy, Jan A, MD       Or   ondansetron [facility]) injection 4 mg  4 mg Intravenous Q6H PRN Mansy, Jan A, MD       pantoprazole (PROTONIX) EC tablet 40 mg  40 mg Oral QAC breakfast Mansy, Jan A, MD   40 mg at 07/10/22 1034   timolol (TIMOPTIC) 0.25 % ophthalmic solution 1 drop  1 drop Both Eyes BID Mansy, Jan A, MD   1 drop at 07/10/22 1037   tiZANidine (ZANAFLEX) tablet 4 mg  4 mg Oral Q6H PRN Mansy, Jan A, MD       traZODone (DESYREL) tablet 25 mg  25 mg Oral QHS PRN Mansy, Jan A, MD   25 mg at 07/09/22 2329     Discharge Medications: Please see discharge summary for a list of discharge medications.  Relevant Imaging Results:  Relevant Lab Results:   Additional Information SS# [id number]  [name], RN

## 2022-07-11 ENCOUNTER — Other Ambulatory Visit (HOSPITAL_COMMUNITY): Payer: Self-pay

## 2022-07-11 ENCOUNTER — Observation Stay
Admit: 2022-07-11 | Discharge: 2022-07-11 | Disposition: A | Discharge status: Home / Self Care | Payer: Medicare HMO | Attending: Internal Medicine | Admitting: Internal Medicine

## 2022-07-11 ENCOUNTER — Telehealth (HOSPITAL_COMMUNITY): Payer: Self-pay | Admitting: Pharmacy Technician

## 2022-07-11 DIAGNOSIS — I4891 Unspecified atrial fibrillation: Secondary | ICD-10-CM | POA: Diagnosis not present

## 2022-07-11 DIAGNOSIS — R000 Tachycardia, unspecified: Secondary | ICD-10-CM | POA: Diagnosis not present

## 2022-07-11 DIAGNOSIS — I38 Endocarditis, valve unspecified: Secondary | ICD-10-CM | POA: Diagnosis not present

## 2022-07-11 DIAGNOSIS — I10 Essential (primary) hypertension: Secondary | ICD-10-CM | POA: Diagnosis not present

## 2022-07-11 DIAGNOSIS — R001 Bradycardia, unspecified: Secondary | ICD-10-CM | POA: Diagnosis not present

## 2022-07-11 DIAGNOSIS — R413 Other amnesia: Secondary | ICD-10-CM | POA: Diagnosis not present

## 2022-07-11 DIAGNOSIS — R531 Weakness: Secondary | ICD-10-CM | POA: Diagnosis not present

## 2022-07-11 LAB — ECHOCARDIOGRAM COMPLETE
AR max vel: 3.27 cm2
AV Area VTI: 3.28 cm2
AV Area mean vel: 3.31 cm2
AV Mean grad: 7 mmHg
AV Peak grad: 11.4 mmHg
Ao pk vel: 1.69 m/s
Area-P 1/2: 2.52 cm2
Height: 63 in
S' Lateral: 2.3 cm
Weight: 1728.41 oz

## 2022-07-11 LAB — CBC
HCT: 39.1 % (ref 36.0–46.0)
Hemoglobin: 12.9 g/dL (ref 12.0–15.0)
MCH: 31.4 pg (ref 26.0–34.0)
MCHC: 33 g/dL (ref 30.0–36.0)
MCV: 95.1 fL (ref 80.0–100.0)
Platelets: 301 10*3/uL (ref 150–400)
RBC: 4.11 MIL/uL (ref 3.87–5.11)
RDW: 13.1 % (ref 11.5–15.5)
WBC: 11.9 10*3/uL — ABNORMAL HIGH (ref 4.0–10.5)
nRBC: 0 % (ref 0.0–0.2)

## 2022-07-11 LAB — BASIC METABOLIC PANEL
Anion gap: 6 (ref 5–15)
BUN: 18 mg/dL (ref 8–23)
CO2: 26 mmol/L (ref 22–32)
Calcium: 8.3 mg/dL — ABNORMAL LOW (ref 8.9–10.3)
Chloride: 102 mmol/L (ref 98–111)
Creatinine, Ser: 0.48 mg/dL (ref 0.44–1.00)
GFR, Estimated: >60 mL/min (ref >60)
Glucose, Bld: 93 mg/dL (ref 70–99)
Potassium: 4 mmol/L (ref 3.5–5.1)
Sodium: 134 mmol/L — ABNORMAL LOW (ref 135–145)

## 2022-07-11 LAB — URINE CULTURE: Culture: NO GROWTH

## 2022-07-11 MED ORDER — DILTIAZEM HCL 25 MG/5ML IV SOLN
5.0000 mg | Freq: Once | INTRAVENOUS | Status: AC
  Administered 2022-07-11: 5 mg via INTRAVENOUS
  Filled 2022-07-11: qty 5

## 2022-07-11 MED ORDER — DILTIAZEM LOAD VIA INFUSION
15.0000 mg | Freq: Once | INTRAVENOUS | Status: AC
  Filled 2022-07-11: qty 15

## 2022-07-11 MED ORDER — DILTIAZEM HCL-DEXTROSE 125-5 MG/125ML-% IV SOLN (PREMIX)
INTRAVENOUS | Status: AC
  Administered 2022-07-11: 15 mg via INTRAVENOUS
  Filled 2022-07-11: qty 125

## 2022-07-11 MED ORDER — HYDROXYZINE HCL 10 MG PO TABS
10.0000 mg | ORAL_TABLET | Freq: Three times a day (TID) | ORAL | Status: DC | PRN
  Administered 2022-07-11: 10 mg via ORAL
  Filled 2022-07-11 (×2): qty 1

## 2022-07-11 MED ORDER — MELATONIN 5 MG PO TABS
5.0000 mg | ORAL_TABLET | Freq: Every evening | ORAL | Status: DC | PRN
  Administered 2022-07-11: 5 mg via ORAL
  Filled 2022-07-11: qty 1

## 2022-07-11 MED ORDER — DILTIAZEM HCL-DEXTROSE 125-5 MG/125ML-% IV SOLN (PREMIX): 5.0000 mg/h | INTRAVENOUS | Status: AC

## 2022-07-11 MED ORDER — ALPRAZOLAM 0.25 MG PO TABS: 0.2500 mg | ORAL_TABLET | Freq: Every day | ORAL | Status: DC | PRN

## 2022-07-11 MED ORDER — METOPROLOL TARTRATE 5 MG/5ML IV SOLN
5.0000 mg | Freq: Once | INTRAVENOUS | Status: AC
Start: 2022-07-11 — End: 2022-07-11
  Administered 2022-07-11: 5 mg via INTRAVENOUS
  Filled 2022-07-11: qty 5

## 2022-07-11 MED ORDER — METOPROLOL TARTRATE 25 MG PO TABS
25.0000 mg | ORAL_TABLET | Freq: Two times a day (BID) | ORAL | Status: DC
  Administered 2022-07-11 – 2022-07-13 (×4): 25 mg via ORAL
  Filled 2022-07-11 (×4): qty 1

## 2022-07-11 MED ORDER — DILTIAZEM LOAD VIA INFUSION
15.0000 mg | Freq: Once | INTRAVENOUS | Status: DC
Start: 2022-07-11 — End: 2022-07-11

## 2022-07-11 NOTE — Progress Notes (Signed)
*  PRELIMINARY RESULTS* Echocardiogram 2D Echocardiogram has been performed.  Sherrie Sport 07/11/2022, 8:25 AM

## 2022-07-11 NOTE — Telephone Encounter (Signed)
Pharmacy Patient Advocate Encounter  Insurance verification completed.    The patient is insured through [employer] Medicare Part D   The patient is currently admitted and ran test claims for the following: Eliquis .  Copays and coinsurance results were relayed to Inpatient clinical team.      

## 2022-07-11 NOTE — TOC Benefit Eligibility Note (Signed)
Patient Teacher, English as a foreign language completed.    The patient is currently admitted and upon discharge could be taking Eliquis 2.5 mg.  The current 30 day co-pay is $45.00.   The patient is insured through [place] Patient Advocate Specialist [place] Patient Advocate Team Direct Number: [phone]  Fax: [phone]

## 2022-07-11 NOTE — Progress Notes (Signed)
PROGRESS NOTE    Mary Thornton  RDE:[id number] DOB: [birth year] DOA: 07/08/2022 PCP: Idelle Crouch, MD   Assessment & Plan:   Principal Problem:   Atrial fibrillation with RVR [facility]) Active Problems:   Essential hypertension   Dyslipidemia   GERD without esophagitis   Coronary artery disease  Assessment and Plan: A. fib: w/ RVR. No longer in a. fib. New onset. Increased metoprolol dose as pt's HR has been in 130s but sinus tachycardia. Diltiazem was d/c by cardio. Cardio recs apprec   CAD: w/ elevated troponins. Minimally elevated troponins are secondary to demand ischemia   GERD: continue on PPI    HLD: continue on statin   HTN: continue on losartan and increased dose of metoprolol    Generalized weakness: PT/OT recs SNF  Memory deficit: w/ likely sundowning. No formal dx of dementia. CT head & MRI brain shows no acute intracranial findings. Pt evidently repeating stories at home as per pt's daughters.  Repeat urine cx is pending. Blood cxs NGTD     DVT prophylaxis: eliquis  Code Status: full  Family Communication: discussed pt's care w/ pt's family at bedside and answered their questions Disposition Plan: likely d/c to SNF   Level of care: Progressive  Status is: Inpatient Remains inpatient appropriate because: severity of illness    Consultants:  Cardio, Dr. Nehemiah Massed   Procedures:   Antimicrobials:   Subjective: Pt c/o malaise   Objective: Vitals:   07/10/22 1912 07/10/22 2350 07/11/22 0407 07/11/22 0716  BP: (!) 103/53 114/65 125/68 125/75  Pulse: (!) 59 75 60 84  Resp: '16 16 16 18  '$ Temp: 97.9 F (36.6 C) (!) 97.5 F (36.4 C) (!) 97.5 F (36.4 C) 98.4 F (36.9 C)  TempSrc:  Oral Oral   SpO2: 96% 98% 98% 98%  Weight:      Height:        Intake/Output Summary (Last 24 hours) at 07/11/2022 0822 Last data filed at 07/10/2022 1900 Gross per 24 hour  Intake 480 ml  Output 800 ml  Net -320 ml   Filed Weights   07/08/22 1700 07/08/22  2152  Weight: 47.6 kg 49 kg    Examination:  General exam: Appears calm & comfortable. Frail appearing  Respiratory system: clear breath sounds b/l  Cardiovascular system: S1/S2+. No rubs or clicks Gastrointestinal system: Abd is soft, NT, ND & hypoactive bowel sounds Central nervous system: Alert & oriented. Moves all extremities  Psychiatry: Judgement and insight appears poor. Flat mood and affect     Data Reviewed: I have personally reviewed following labs and imaging studies  CBC: Recent Labs  Lab 07/08/22 1637 07/09/22 0535 07/10/22 0451 07/11/22 0515  WBC 9.7 11.6* 14.3* 11.9*  NEUTROABS 7.6  --   --   --   HGB 13.0 12.8 12.0 12.9  HCT 39.0 38.2 35.7* 39.1  MCV 95.6 95.3 94.7 95.1  PLT 319 293 282 631   Basic Metabolic Panel: Recent Labs  Lab 07/08/22 1637 07/09/22 0535 07/09/22 2045 07/10/22 0451 07/11/22 0515  NA 136 137  --  133* 134*  K 4.6 3.8  --  3.7 4.0  CL 103 104  --  101 102  CO2 24 25  --  29 26  GLUCOSE 120* 119*  --  117* 93  BUN 14 12  --  18 18  CREATININE 0.62 0.51  --  0.56 0.48  CALCIUM 8.3* 8.6*  --  8.0* 8.3*  MG  --   --  2.4  --   --    GFR: Estimated Creatinine Clearance: 42.7 mL/min (by C-G formula based on SCr of 0.48 mg/dL). Liver Function Tests: Recent Labs  Lab 07/08/22 1637  AST 32  ALT 34  ALKPHOS 69  BILITOT 0.7  PROT 6.3*  ALBUMIN 4.2   No results for input(s): "LIPASE", "AMYLASE" in the last 168 hours. No results for input(s): "AMMONIA" in the last 168 hours. Coagulation Profile: Recent Labs  Lab 07/08/22 1637 07/09/22 0535  INR 1.1 1.2   Cardiac Enzymes: No results for input(s): "CKTOTAL", "CKMB", "CKMBINDEX", "TROPONINI" in the last 168 hours. BNP (last 3 results) No results for input(s): "PROBNP" in the last 8760 hours. HbA1C: No results for input(s): "HGBA1C" in the last 72 hours. CBG: Recent Labs  Lab 07/10/22 0740  GLUCAP 135*   Lipid Profile: No results for input(s): "CHOL", "HDL",  "LDLCALC", "TRIG", "CHOLHDL", "LDLDIRECT" in the last 72 hours. Thyroid Function Tests: Recent Labs    07/08/22 1637  TSH 0.696  FREET4 1.55*   Anemia Panel: No results for input(s): "VITAMINB12", "FOLATE", "FERRITIN", "TIBC", "IRON", "RETICCTPCT" in the last 72 hours. Sepsis Labs: Recent Labs  Lab 07/08/22 1637 07/08/22 1906  LATICACIDVEN 1.1 1.1    Recent Results (from the past 240 hour(s))  Resp Panel by RT-PCR (Flu A&B, Covid) Anterior Nasal Swab     Status: None   Collection Time: 07/08/22  4:37 PM   Specimen: Anterior Nasal Swab  Result Value Ref Range Status   SARS Coronavirus 2 by RT PCR NEGATIVE NEGATIVE Final    Comment: (NOTE) SARS-CoV-2 target nucleic acids are NOT DETECTED.  The SARS-CoV-2 RNA is generally detectable in upper respiratory specimens during the acute phase of infection. The lowest concentration of SARS-CoV-2 viral copies this assay can detect is 138 copies/mL. A negative result does not preclude SARS-Cov-2 infection and should not be used as the sole basis for treatment or other patient management decisions. A negative result may occur with  improper specimen collection/handling, submission of specimen other than nasopharyngeal swab, presence of viral mutation(s) within the areas targeted by this assay, and inadequate number of viral copies(<138 copies/mL). A negative result must be combined with clinical observations, patient history, and epidemiological information. The expected result is Negative.  Fact Sheet for Patients:  EntrepreneurPulse.com.au  Fact Sheet for Healthcare Providers:  IncredibleEmployment.be  This test is no t yet approved or cleared by the [place] FDA and  has been authorized for detection and/or diagnosis of SARS-CoV-2 by FDA under an Emergency Use Authorization (EUA). This EUA will remain  in effect (meaning this test can be used) for the duration of the COVID-19 declaration  under Section 564(b)(1) of the Act, 21 U.S.C.section 360bbb-3(b)(1), unless the authorization is terminated  or revoked sooner.       Influenza A by PCR NEGATIVE NEGATIVE Final   Influenza B by PCR NEGATIVE NEGATIVE Final    Comment: (NOTE) The Xpert Xpress SARS-CoV-2/FLU/RSV plus assay is intended as an aid in the diagnosis of influenza from Nasopharyngeal swab specimens and should not be used as a sole basis for treatment. Nasal washings and aspirates are unacceptable for Xpert Xpress SARS-CoV-2/FLU/RSV testing.  Fact Sheet for Patients: EntrepreneurPulse.com.au  Fact Sheet for Healthcare Providers: IncredibleEmployment.be  This test is not yet approved or cleared by the [place] FDA and has been authorized for detection and/or diagnosis of SARS-CoV-2 by FDA under an Emergency Use Authorization (EUA). This EUA will remain in effect (meaning this test can  be used) for the duration of the COVID-19 declaration under Section 564(b)(1) of the Act, 21 U.S.C. section 360bbb-3(b)(1), unless the authorization is terminated or revoked.  Performed at [facility], [address], [place], [place] [address]   Blood Culture (routine x 2)     Status: None (Preliminary result)   Collection Time: 07/08/22  4:40 PM   Specimen: Right Antecubital; Blood  Result Value Ref Range Status   Specimen Description RIGHT ANTECUBITAL  Final   Special Requests   Final    BOTTLES DRAWN AEROBIC AND ANAEROBIC Blood Culture results may not be optimal due to an inadequate volume of blood received in culture bottles   Culture   Final    NO GROWTH 3 DAYS Performed at [facility], [address] [place], [place] [address]    Report Status PENDING  Incomplete  Blood Culture (routine x 2)     Status: None (Preliminary result)   Collection Time: 07/08/22  4:45 PM   Specimen: Right Antecubital; Blood  Result Value Ref Range Status   Specimen  Description RIGHT ANTECUBITAL  Final   Special Requests   Final    BOTTLES DRAWN AEROBIC AND ANAEROBIC Blood Culture results may not be optimal due to an inadequate volume of blood received in culture bottles   Culture   Final    NO GROWTH 3 DAYS Performed at [facility], [address], [place], [place] [address]    Report Status PENDING  Incomplete  Urine Culture     Status: Abnormal   Collection Time: 07/08/22  4:48 PM   Specimen: In/Out Cath Urine  Result Value Ref Range Status   Specimen Description   Final    IN/OUT CATH URINE Performed at [facility], [address] [place], [place] [address]    Special Requests   Final    NONE Performed at [facility], [address], [place], [place] [address]    Culture MULTIPLE SPECIES PRESENT, SUGGEST RECOLLECTION (A)  Final   Report Status 07/10/2022 FINAL  Final         Radiology Studies: MR BRAIN WO CONTRAST  Result Date: 07/10/2022 CLINICAL DATA:  Delirium EXAM: MRI HEAD WITHOUT CONTRAST TECHNIQUE: Multiplanar, multiecho pulse sequences of the brain and surrounding structures were obtained without intravenous contrast. COMPARISON:  Head CT from 2 days ago FINDINGS: Brain: No acute infarction, hemorrhage, hydrocephalus, extra-axial collection or mass lesion. Chronic small vessel ischemia in the cerebral white matter, mild for age. Mild cerebral volume loss for age. Vascular: Normal flow voids. Skull and upper cervical spine: Normal marrow signal. Sinuses/Orbits: Right cataract resection. Other: Intermittent motion artifact, likely best obtainable. IMPRESSION: 1. Aging brain without acute or reversible finding. 2. Motion degraded. Electronically Signed   By: Jorje Guild M.D.   On: 07/10/2022 12:52        Scheduled Meds:  apixaban  2.5 mg Oral BID   aspirin EC  81 mg Oral Daily   azelastine  1 spray Each Nare BID   baclofen  20 mg Oral BID   calcium-vitamin D  1 tablet Oral Daily    erythromycin  1 Application Right Eye TID   loratadine  10 mg Oral Daily   losartan  50 mg Oral BID   metoprolol tartrate  12.5 mg Oral BID   mirtazapine  15 mg Oral QHS   montelukast  10 mg Oral Daily   multivitamin with minerals  1 tablet Oral Q1500   multivitamin-lutein  1 capsule Oral BID   pantoprazole  40 mg Oral QAC breakfast   timolol  1 drop Both Eyes BID   Continuous Infusions:     LOS: 1 day    Time spent: 30 mins    Wyvonnia Dusky, MD [facility] Pager [phone]  If 7PM-7AM, please contact night-coverage [email or web address] 07/11/2022, 8:22 AM

## 2022-07-11 NOTE — TOC Progression Note (Signed)
Transition of Care [facility]) - Progression Note    Patient Details  Name: Mary Thornton MRN: [id number] Date of Birth: [birth year]  Transition of Care [facility]) CM/SW [place] [place] Phone Number: 07/11/2022, 10:13 AM  Clinical Narrative:      Patient son called to report preference for [facility], CSW has reached out to [facility] to review for bed offer. Pending bed offer at this time. Per son, second choice is [facility] if unable to go to [facility].       Expected Discharge Plan and Services                                                 Social Determinants of Health (SDOH) Interventions    Readmission Risk Interventions     No data to display

## 2022-07-11 NOTE — Progress Notes (Signed)
Physical Therapy Treatment Patient Details Name: Mary Thornton MRN: [id number] DOB: 04-Jun-1941 Today's Date: 07/11/2022   History of Present Illness Pt is an 81 y/o F who presented to the ER with c/o acute onset of generalized weakness & AMS over the last week. Pt is being treated for a-fib with RVR. PMH: asthma, OA, diverticulosis, GERD, HTN, dyslipidemia, PSVT, GERD    PT Comments    Pt seen for PT tx with pt agreeable. Pt remains pleasantly confused, but is oriented to location today & was not yesterday. Pt is able to complete supine>sit with supervision<>CGA with HOB elevated & bed rails PRN. Upon initial STS with 1UE HHA pt requires max assist with decreased ability to shift pelvis anteriorly & come to full upright standing. Attempted STS again with BUE HHA & pt able to complete with min assist with improvement in upright standing posture. Progressed to side steps along EOB with PT holding BUE with task focusing on improving weight shifting & balance. Provided pt with RW & compared it to shopping cart in the store & pt was able to ambulate into hallway to nurses station & back with RW & min assist. Pt noted to be incontinent of BM smear so assisted with doffing soiled underwear. Pt voiced need to void & performed stand pivot recliner<>BSC with min/mod assist with max cuing for hand placement & assistance with pivoting. Pt was able to perform peri hygiene after voiding. Reviewed use of call bell with pt but pt just repeating conversation she had with PT 2/2 impaired cognition. Continue to recommend STR upon d/c.     Recommendations for follow up therapy are one component of a multi-disciplinary discharge planning process, led by the attending physician.  Recommendations may be updated based on patient status, additional functional criteria and insurance authorization.  Follow Up Recommendations  Skilled nursing-short term rehab (<3 hours/day) Can patient physically be transported by private  vehicle: No   Assistance Recommended at Discharge Frequent or constant Supervision/Assistance  Patient can return home with the following A lot of help with walking and/or transfers;A lot of help with bathing/dressing/bathroom;Assist for transportation;Assistance with cooking/housework;Help with stairs or ramp for entrance;Direct supervision/assist for medications management   Equipment Recommendations  None recommended by PT (TBD in next venue)    Recommendations for Other Services       Precautions / Restrictions Precautions Precautions: Fall Restrictions Weight Bearing Restrictions: No     Mobility  Bed Mobility Overal bed mobility: Needs Assistance Bed Mobility: Supine to Sit     Supine to sit: Min guard, Supervision, HOB elevated          Transfers Overall transfer level: Needs assistance Equipment used: 1 person hand held assist Transfers: Sit to/from Stand Sit to Stand: Min assist, Max assist Stand pivot transfers: Min assist, Mod assist              Ambulation/Gait Ambulation/Gait assistance: Min assist Gait Distance (Feet): 40 Feet Assistive device: Rolling walker (2 wheels) Gait Pattern/deviations: Decreased step length - right, Decreased step length - left, Decreased stride length, Decreased dorsiflexion - right, Decreased dorsiflexion - left Gait velocity: decreased         Stairs             Wheelchair Mobility    Modified Rankin (Stroke Patients Only)       Balance Overall balance assessment: Needs assistance Sitting-balance support: Feet supported Sitting balance-Leahy Scale: Fair Sitting balance - Comments: supervision static sitting EOB, performs peri hygiene  on BSC without overt LOB, has armrest to lean L on   Standing balance support: During functional activity, Bilateral upper extremity supported Standing balance-Leahy Scale: Poor                              Cognition Arousal/Alertness:  Awake/alert Behavior During Therapy: WFL for tasks assessed/performed Overall Cognitive Status: Impaired/Different from baseline Area of Impairment: Orientation, Attention, Memory, Following commands, Safety/judgement, Awareness, Problem solving                 Orientation Level:  (pt oriented to place today which is an improvement from yesterday)   Memory: Decreased short-term memory Following Commands: Follows one step commands with increased time Safety/Judgement: Decreased awareness of safety, Decreased awareness of deficits Awareness: Intellectual Problem Solving: Slow processing, Difficulty sequencing, Requires verbal cues, Requires tactile cues General Comments: Pleasantly confused, follows simple commands with extra time.        Exercises      General Comments General comments (skin integrity, edema, etc.): HR 96 bpm      Pertinent Vitals/Pain Pain Assessment Pain Assessment: Faces Faces Pain Scale: No hurt    Home Living                          Prior Function            PT Goals (current goals can now be found in the care plan section) Acute Rehab PT Goals Patient Stated Goal: get better PT Goal Formulation: With patient/family Time For Goal Achievement: 07/24/22 Potential to Achieve Goals: Good Progress towards PT goals: Progressing toward goals    Frequency    Min 2X/week      PT Plan Current plan remains appropriate    Co-evaluation              AM-PAC PT "6 Clicks" Mobility   Outcome Measure  Help needed turning from your back to your side while in a flat bed without using bedrails?: None Help needed moving from lying on your back to sitting on the side of a flat bed without using bedrails?: A Little Help needed moving to and from a bed to a chair (including a wheelchair)?: A Lot Help needed standing up from a chair using your arms (e.g., wheelchair or bedside chair)?: A Lot Help needed to walk in hospital room?: A  Little Help needed climbing 3-5 steps with a railing? : Total 6 Click Score: 15    End of Session Equipment Utilized During Treatment: Gait belt Activity Tolerance: Patient tolerated treatment well Patient left: in chair;with chair alarm set;with call bell/phone within reach Nurse Communication: Mobility status PT Visit Diagnosis: Unsteadiness on feet (R26.81);Muscle weakness (generalized) (M62.81);Difficulty in walking, not elsewhere classified (R26.2)     Time: 6468-0321 PT Time Calculation (min) (ACUTE ONLY): 24 min  Charges:  $Therapeutic Activity: 8-22 mins $Neuromuscular Re-education: 8-22 mins                     Lavone Nian, PT, DPT 07/11/22, 1:51 PM  Waunita Schooner 07/11/2022, 1:48 PM

## 2022-07-12 DIAGNOSIS — I10 Essential (primary) hypertension: Secondary | ICD-10-CM | POA: Diagnosis not present

## 2022-07-12 DIAGNOSIS — R413 Other amnesia: Secondary | ICD-10-CM | POA: Diagnosis not present

## 2022-07-12 DIAGNOSIS — R000 Tachycardia, unspecified: Secondary | ICD-10-CM | POA: Diagnosis not present

## 2022-07-12 DIAGNOSIS — I4891 Unspecified atrial fibrillation: Secondary | ICD-10-CM | POA: Diagnosis not present

## 2022-07-12 DIAGNOSIS — R531 Weakness: Secondary | ICD-10-CM | POA: Diagnosis not present

## 2022-07-12 DIAGNOSIS — R001 Bradycardia, unspecified: Secondary | ICD-10-CM | POA: Diagnosis not present

## 2022-07-12 LAB — CBC
HCT: 35.3 % — ABNORMAL LOW (ref 36.0–46.0)
Hemoglobin: 11.9 g/dL — ABNORMAL LOW (ref 12.0–15.0)
MCH: 32.3 pg (ref 26.0–34.0)
MCHC: 33.7 g/dL (ref 30.0–36.0)
MCV: 95.9 fL (ref 80.0–100.0)
Platelets: 293 10*3/uL (ref 150–400)
RBC: 3.68 MIL/uL — ABNORMAL LOW (ref 3.87–5.11)
RDW: 13 % (ref 11.5–15.5)
WBC: 10.4 10*3/uL (ref 4.0–10.5)
nRBC: 0 % (ref 0.0–0.2)

## 2022-07-12 LAB — BASIC METABOLIC PANEL
Anion gap: 7 (ref 5–15)
BUN: 13 mg/dL (ref 8–23)
CO2: 28 mmol/L (ref 22–32)
Calcium: 8.4 mg/dL — ABNORMAL LOW (ref 8.9–10.3)
Chloride: 102 mmol/L (ref 98–111)
Creatinine, Ser: 0.49 mg/dL (ref 0.44–1.00)
GFR, Estimated: >60 mL/min (ref >60)
Glucose, Bld: 105 mg/dL — ABNORMAL HIGH (ref 70–99)
Potassium: 3.7 mmol/L (ref 3.5–5.1)
Sodium: 137 mmol/L (ref 135–145)

## 2022-07-12 MED ORDER — ATORVASTATIN CALCIUM 20 MG PO TABS
40.0000 mg | ORAL_TABLET | Freq: Every day | ORAL | Status: DC
  Administered 2022-07-12: 40 mg via ORAL
  Filled 2022-07-12: qty 2

## 2022-07-12 MED ORDER — LOSARTAN POTASSIUM 25 MG PO TABS
25.0000 mg | ORAL_TABLET | Freq: Two times a day (BID) | ORAL | Status: DC
  Administered 2022-07-12 (×2): 25 mg via ORAL
  Filled 2022-07-12 (×2): qty 1

## 2022-07-12 MED ORDER — DILTIAZEM HCL ER COATED BEADS 120 MG PO CP24
120.0000 mg | ORAL_CAPSULE | Freq: Every day | ORAL | Status: DC
  Administered 2022-07-12 – 2022-07-13 (×2): 120 mg via ORAL
  Filled 2022-07-12 (×2): qty 1

## 2022-07-12 NOTE — Plan of Care (Signed)

## 2022-07-12 NOTE — Progress Notes (Signed)
Occupational Therapy Treatment Patient Details Name: Mary Thornton MRN: [id number] DOB: 08-Mar-1941 Today's Date: 07/12/2022   History of present illness Pt is an 81 y/o F who presented to the ER with c/o acute onset of generalized weakness & AMS over the last week. Pt is being treated for a-fib with RVR. PMH: asthma, OA, diverticulosis, GERD, HTN, dyslipidemia, PSVT, GERD   OT comments  Pt seen for OT tx. Pt up in the recliner and agreeable. Pt required VC for hand placement during ADL transfers from recliner and from [facility]. Pt completed toileting with CGA for BSC transfer and set up + CGA in standing for pericare and clothing mgt. No overt LOB but pt was mildly unsteady throughout. After toileting, pt ambulated 20' + 20' with CGA and RW in room with PRN VC for safety while negotiating intentional obstacles with no overt LOB but again mildly unsteady. Pt progressing towards goals, continues to benefit from skilled OT. Continue to recommend SNF at this time.    Recommendations for follow up therapy are one component of a multi-disciplinary discharge planning process, led by the attending physician.  Recommendations may be updated based on patient status, additional functional criteria and insurance authorization.    Follow Up Recommendations  Skilled nursing-short term rehab (<3 hours/day)    Assistance Recommended at Discharge Frequent or constant Supervision/Assistance  Patient can return home with the following  A lot of help with bathing/dressing/bathroom;A little help with walking and/or transfers;Assistance with cooking/housework;Assist for transportation;Help with stairs or ramp for entrance;Direct supervision/assist for medications management   Equipment Recommendations  Tub/shower seat;BSC/3in1    Recommendations for Other Services      Precautions / Restrictions Precautions Precautions: Fall Restrictions Weight Bearing Restrictions: No       Mobility Bed Mobility                General bed mobility comments: NT,  up in recliner at start and end of session    Transfers Overall transfer level: Needs assistance Equipment used: Rolling walker (2 wheels) Transfers: Sit to/from Stand Sit to Stand: Min guard           General transfer comment: VC for hand placement with improvement noted     Balance Overall balance assessment: Needs assistance Sitting-balance support: Feet supported Sitting balance-Leahy Scale: Fair     Standing balance support: No upper extremity supported, During functional activity Standing balance-Leahy Scale: Fair Standing balance comment: no overt LOB while completing clothing mgt in standing                           ADL either performed or assessed with clinical judgement   ADL Overall ADL's : Needs assistance/impaired                         Toilet Transfer: Min guard;BSC/3in1;Rolling walker (2 wheels) Toilet Transfer Details (indicate cue type and reason): VC for hand placement, safety Toileting- Clothing Manipulation and Hygiene: Min guard;Sit to/from stand Toileting - Clothing Manipulation Details (indicate cue type and reason): able to complete clothing mgt with CGA in standing     Functional mobility during ADLs: Minimal assistance;Min guard;Rolling walker (2 wheels)      Extremity/Trunk Assessment              Vision       Perception     Praxis      Cognition Arousal/Alertness: Awake/alert Behavior During Therapy: [facility]  for tasks assessed/performed Overall Cognitive Status: No family/caregiver present to determine baseline cognitive functioning                                 General Comments: decreased safety awareness, needs VC        Exercises Other Exercises Other Exercises: After toileting, pt ambulated 20' + 20' with CGA and RW in room with PRN VC for safety.    Shoulder Instructions       General Comments      Pertinent Vitals/ Pain        Pain Assessment Pain Assessment: No/denies pain  Home Living                                          Prior Functioning/Environment              Frequency  Min 2X/week        Progress Toward Goals  OT Goals(current goals can now be found in the care plan section)  Progress towards OT goals: Progressing toward goals  Acute Rehab OT Goals Patient Stated Goal: get out of bed OT Goal Formulation: With patient Time For Goal Achievement: 07/24/22 Potential to Achieve Goals: Good  Plan Discharge plan remains appropriate;Frequency remains appropriate    Co-evaluation                 AM-PAC OT "6 Clicks" Daily Activity     Outcome Measure   Help from another person eating meals?: None Help from another person taking care of personal grooming?: A Little Help from another person toileting, which includes using toliet, bedpan, or urinal?: A Little Help from another person bathing (including washing, rinsing, drying)?: A Lot Help from another person to put on and taking off regular upper body clothing?: A Little Help from another person to put on and taking off regular lower body clothing?: A Lot 6 Click Score: 17    End of Session Equipment Utilized During Treatment: Gait belt;Rolling walker (2 wheels)  OT Visit Diagnosis: Unsteadiness on feet (R26.81);History of falling (Z91.81);Muscle weakness (generalized) (M62.81)   Activity Tolerance Patient tolerated treatment well   Patient Left in chair;with call bell/phone within reach;with chair alarm set   Nurse Communication          Time: 3976-7341 OT Time Calculation (min): 23 min  Charges: OT General Charges $OT Visit: 1 Visit OT Treatments $Self Care/Home Management : 23-37 mins  Ardeth Perfect., MPH, MS, OTR/L ascom [phone] 07/12/22, 11:56 AM

## 2022-07-12 NOTE — Progress Notes (Signed)
PROGRESS NOTE   HPI was taken from Dr. Sidney Ace: Mary Thornton is a 81 y.o. Caucasian female with medical history significant for asthma, osteoarthritis, diverticulosis, GERD, hypertension, dyslipidemia and PSVT as well as GERD, who presented to the emergency room with acute onset of generalized weakness and altered mental status over the last week.  Per her son over the last 3 days she has not been very conversant.  She would stay home and not go outside as her usual.  He felt she was failing to thrive.  She denies any chest pain or palpitations.  No cough or wheezing or hemoptysis.  No leg pain or edema or recent travels or surgeries.  No paresthesias or focal muscle weakness.  No dysuria no hematuria, urgency or frequency or flank pain. ED Course: When she came to the ER, BP was 132/91 with heart rate of 140 and atrial fibrillation and otherwise normal vital signs.  Labs revealed unremarkable CMP except for total protein 6.3.  High sensitive troponin I was 22.  BNP was 144.5.  Lactic acid was 1.1 and CBC was within normal.  TSH came back 0.69 and free T4 was 1.55.  Influenza antigens and COVID-19 PCR came back negative. EKG as reviewed by me : Initial EKG showed atrial fibrillation with rapid ventricular response of 149 with LVH, LAD and lateral ST T changes.  Repeat EKG after conversion showed normal sinus rhythm with a rate of 73 with LAD and moderate voltage criteria for LVH. Imaging: Portable chest ray showed no acute cardiopulmonary disease.Noncontrast head CT scan revealed no acute intracranial abnormalities.  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   The patient was given 5 mg of IV Cardizem bolus followed by IV Cardizem drip.  She was also given 1 L bolus of IV normal saline and initially IV cefepime and vancomycin for concern about sepsis.  She will be admitted to a medical unit bed for further evaluation and management.  As per  Dr. Jimmye Norman 9/9-9/12/23: Pt presented w/ new onset a. fib w/ RVR and was initially put on IV diltiazem drip. Diltiazem drip was d/c and so was pt's home dose of po diltiazem as per cardio (Dr. Fabio Asa). Overnight on 07/11/22, pt HR was back into 130-150s and pt was again placed by on IV diltiazem. Today, IV diltiazem was d/c and pt was started back on po diltiazem w/ '120mg'$  CD as per cardio (Dr. Josefa Half PA Alain Marion). Metoprolol dose was increased to '25mg'$  BID  & losartan dose was reduced as well. Also, PT/OT evaluated pt and recommends SNF. Bed offer from [facility] but waiting in insurance auth.   [name]  ZOX:[id number] DOB: 08/26/1941 DOA: 07/08/2022 PCP: Idelle Crouch, MD   Assessment & Plan:   Principal Problem:   Atrial fibrillation with RVR [facility]) Active Problems:   Essential hypertension   Dyslipidemia   GERD without esophagitis   Coronary artery disease  Assessment and Plan: A. fib: w/ RVR. No longer in a. fib. New onset. Started back on po diltiazem at '120mg'$  CD daily, increased dose of metoprolol '25mg'$  BID. IV diltiazem was d/c from overnight. Continue on eliquis  CAD: w/ elevated troponins. Minimally elevated troponins are secondary to demand ischemia   GERD: continue on PPI    HLD: continue on statin   HTN: continue on increased dose of metoprolol &  lower dose of losartan as per cardio    Generalized weakness: PT/OT recs  SNF  Memory deficit: w/ likely sundowning. No formal dx of dementia. CT head & MRI brain shows no acute intracranial findings. Pt evidently repeating stories at home as per pt's daughters.  Repeat urine cx is pending. Blood cxs NGTD     DVT prophylaxis: eliquis  Code Status: full  Family Communication: discussed pt's care w/ pt's family via phone and answered their questions Disposition Plan: likely d/c to SNF   Level of care: Progressive  Status is: Inpatient Remains inpatient appropriate because: severity of  illness    Consultants:  Cardio, [place] cardio  Procedures:   Antimicrobials:   Subjective: Pt c/o fatigue   Objective: Vitals:   07/12/22 0400 07/12/22 0500 07/12/22 0600 07/12/22 0700  BP: (!) 103/55 113/63 113/64   Pulse:      Resp: 20 (!) 22 18   Temp:  98.1 F (36.7 C)  97.9 F (36.6 C)  TempSrc:  Oral  Oral  SpO2:      Weight:      Height:        Intake/Output Summary (Last 24 hours) at 07/12/2022 0852 Last data filed at 07/12/2022 0539 Gross per 24 hour  Intake --  Output 2900 ml  Net -2900 ml   Filed Weights   07/08/22 1700 07/08/22 2152  Weight: 47.6 kg 49 kg    Examination:  General exam: Appears comfortable. Frail appearing  Respiratory system: clear breath sounds b/l  Cardiovascular system: S1 & S2+. No rubs or clicks  Gastrointestinal system: Abd is soft, NT, ND & hypoactive bowel sounds  Central nervous system: Alert & awake. Moves all extremities  Psychiatry: judgement and insight appears poor. Flat mood and affect     Data Reviewed: I have personally reviewed following labs and imaging studies  CBC: Recent Labs  Lab 07/08/22 1637 07/09/22 0535 07/10/22 0451 07/11/22 0515 07/12/22 0641  WBC 9.7 11.6* 14.3* 11.9* 10.4  NEUTROABS 7.6  --   --   --   --   HGB 13.0 12.8 12.0 12.9 11.9*  HCT 39.0 38.2 35.7* 39.1 35.3*  MCV 95.6 95.3 94.7 95.1 95.9  PLT 319 293 282 301 474   Basic Metabolic Panel: Recent Labs  Lab 07/08/22 1637 07/09/22 0535 07/09/22 2045 07/10/22 0451 07/11/22 0515 07/12/22 0641  NA 136 137  --  133* 134* 137  K 4.6 3.8  --  3.7 4.0 3.7  CL 103 104  --  101 102 102  CO2 24 25  --  '29 26 28  '$ GLUCOSE 120* 119*  --  117* 93 105*  BUN 14 12  --  '18 18 13  '$ CREATININE 0.62 0.51  --  0.56 0.48 0.49  CALCIUM 8.3* 8.6*  --  8.0* 8.3* 8.4*  MG  --   --  2.4  --   --   --    GFR: Estimated Creatinine Clearance: 42.7 mL/min (by C-G formula based on SCr of 0.49 mg/dL). Liver Function Tests: Recent Labs  Lab  07/08/22 1637  AST 32  ALT 34  ALKPHOS 69  BILITOT 0.7  PROT 6.3*  ALBUMIN 4.2   No results for input(s): "LIPASE", "AMYLASE" in the last 168 hours. No results for input(s): "AMMONIA" in the last 168 hours. Coagulation Profile: Recent Labs  Lab 07/08/22 1637 07/09/22 0535  INR 1.1 1.2   Cardiac Enzymes: No results for input(s): "CKTOTAL", "CKMB", "CKMBINDEX", "TROPONINI" in the last 168 hours. BNP (last 3 results) No results for input(s): "PROBNP" in the last  8760 hours. HbA1C: No results for input(s): "HGBA1C" in the last 72 hours. CBG: Recent Labs  Lab 07/10/22 0740  GLUCAP 135*   Lipid Profile: No results for input(s): "CHOL", "HDL", "LDLCALC", "TRIG", "CHOLHDL", "LDLDIRECT" in the last 72 hours. Thyroid Function Tests: No results for input(s): "TSH", "T4TOTAL", "FREET4", "T3FREE", "THYROIDAB" in the last 72 hours.  Anemia Panel: No results for input(s): "VITAMINB12", "FOLATE", "FERRITIN", "TIBC", "IRON", "RETICCTPCT" in the last 72 hours. Sepsis Labs: Recent Labs  Lab 07/08/22 1637 07/08/22 1906  LATICACIDVEN 1.1 1.1    Recent Results (from the past 240 hour(s))  Resp Panel by RT-PCR (Flu A&B, Covid) Anterior Nasal Swab     Status: None   Collection Time: 07/08/22  4:37 PM   Specimen: Anterior Nasal Swab  Result Value Ref Range Status   SARS Coronavirus 2 by RT PCR NEGATIVE NEGATIVE Final    Comment: (NOTE) SARS-CoV-2 target nucleic acids are NOT DETECTED.  The SARS-CoV-2 RNA is generally detectable in upper respiratory specimens during the acute phase of infection. The lowest concentration of SARS-CoV-2 viral copies this assay can detect is 138 copies/mL. A negative result does not preclude SARS-Cov-2 infection and should not be used as the sole basis for treatment or other patient management decisions. A negative result may occur with  improper specimen collection/handling, submission of specimen other than nasopharyngeal swab, presence of viral  mutation(s) within the areas targeted by this assay, and inadequate number of viral copies(<138 copies/mL). A negative result must be combined with clinical observations, patient history, and epidemiological information. The expected result is Negative.  Fact Sheet for Patients:  EntrepreneurPulse.com.au  Fact Sheet for Healthcare Providers:  IncredibleEmployment.be  This test is no t yet approved or cleared by the [place] FDA and  has been authorized for detection and/or diagnosis of SARS-CoV-2 by FDA under an Emergency Use Authorization (EUA). This EUA will remain  in effect (meaning this test can be used) for the duration of the COVID-19 declaration under Section 564(b)(1) of the Act, 21 U.S.C.section 360bbb-3(b)(1), unless the authorization is terminated  or revoked sooner.       Influenza A by PCR NEGATIVE NEGATIVE Final   Influenza B by PCR NEGATIVE NEGATIVE Final    Comment: (NOTE) The Xpert Xpress SARS-CoV-2/FLU/RSV plus assay is intended as an aid in the diagnosis of influenza from Nasopharyngeal swab specimens and should not be used as a sole basis for treatment. Nasal washings and aspirates are unacceptable for Xpert Xpress SARS-CoV-2/FLU/RSV testing.  Fact Sheet for Patients: EntrepreneurPulse.com.au  Fact Sheet for Healthcare Providers: IncredibleEmployment.be  This test is not yet approved or cleared by the [place] FDA and has been authorized for detection and/or diagnosis of SARS-CoV-2 by FDA under an Emergency Use Authorization (EUA). This EUA will remain in effect (meaning this test can be used) for the duration of the COVID-19 declaration under Section 564(b)(1) of the Act, 21 U.S.C. section 360bbb-3(b)(1), unless the authorization is terminated or revoked.  Performed at [facility], [address] [place], [place] [address]   Blood Culture (routine x 2)      Status: None (Preliminary result)   Collection Time: 07/08/22  4:40 PM   Specimen: Right Antecubital; Blood  Result Value Ref Range Status   Specimen Description RIGHT ANTECUBITAL  Final   Special Requests   Final    BOTTLES DRAWN AEROBIC AND ANAEROBIC Blood Culture results may not be optimal due to an inadequate volume of blood received in culture bottles  Culture   Final    NO GROWTH 4 DAYS Performed at [facility], [address] [place], [place] [address]    Report Status PENDING  Incomplete  Blood Culture (routine x 2)     Status: None (Preliminary result)   Collection Time: 07/08/22  4:45 PM   Specimen: Right Antecubital; Blood  Result Value Ref Range Status   Specimen Description RIGHT ANTECUBITAL  Final   Special Requests   Final    BOTTLES DRAWN AEROBIC AND ANAEROBIC Blood Culture results may not be optimal due to an inadequate volume of blood received in culture bottles   Culture   Final    NO GROWTH 4 DAYS Performed at [facility], [address], [place], [place] [address]    Report Status PENDING  Incomplete  Urine Culture     Status: Abnormal   Collection Time: 07/08/22  4:48 PM   Specimen: In/Out Cath Urine  Result Value Ref Range Status   Specimen Description   Final    IN/OUT CATH URINE Performed at [facility], [address], [place], [place] [address]    Special Requests   Final    NONE Performed at [facility], [address] [place], [place] [address]    Culture MULTIPLE SPECIES PRESENT, SUGGEST RECOLLECTION (A)  Final   Report Status 07/10/2022 FINAL  Final  Urine Culture     Status: None   Collection Time: 07/10/22 11:03 AM   Specimen: Urine, Clean Catch  Result Value Ref Range Status   Specimen Description   Final    URINE, CLEAN CATCH Performed at [facility], [address], [place], [place] [address]    Special Requests   Final    NONE Performed at [facility], [address] [place], [place] [address]    Culture   Final    NO GROWTH Performed at [facility], [place] [address], [place], [place] [address]    Report Status 07/11/2022 FINAL  Final         Radiology Studies: ECHOCARDIOGRAM COMPLETE  Result Date: 07/11/2022    ECHOCARDIOGRAM REPORT   Patient Name:   TAMIYAH MOULIN [facility] Date of Exam: 07/11/2022 Medical Rec #:  [id number]           Height:       63.0 in Accession #:    [id number]          Weight:       108.0 lb Date of Birth:  October 06, 1941           BSA:          1.488 m Patient Age:    68 years            BP:           125/75 mmHg Patient Gender: F                   HR:           84 bpm. Exam Location:  [facility] Procedure: 2D Echo, Cardiac Doppler and Color Doppler Indications:     Endocarditis I38  History:         Patient has no prior history of Echocardiogram examinations.                  Risk Factors:Hypertension and Dyslipidemia.  Sonographer:     Sherrie Sport Referring Phys:  [place] Diagnosing Phys: Isaias Cowman  MD IMPRESSIONS  1. Left ventricular ejection fraction, by estimation, is 65 to 70%. The left ventricle has normal function. The left ventricle has no regional wall motion abnormalities. Left ventricular diastolic parameters were normal.  2. Right ventricular systolic function is normal. The right ventricular size is normal.  3. The mitral valve is normal in structure. Mild mitral valve regurgitation. No evidence of mitral stenosis.  4. The aortic valve is normal in structure. Aortic valve regurgitation is not visualized. No aortic stenosis is present.  5. The inferior vena cava is normal in size with greater than 50% respiratory variability, suggesting right atrial pressure of 3 mmHg. FINDINGS  Left Ventricle: Left ventricular ejection fraction, by estimation, is 65 to 70%. The left ventricle has normal function. The left ventricle has no regional wall motion abnormalities. The left ventricular internal cavity size was  normal in size. There is  no left ventricular hypertrophy. Left ventricular diastolic parameters were normal. Right Ventricle: The right ventricular size is normal. No increase in right ventricular wall thickness. Right ventricular systolic function is normal. Left Atrium: Left atrial size was normal in size. Right Atrium: Right atrial size was normal in size. Pericardium: There is no evidence of pericardial effusion. Mitral Valve: The mitral valve is normal in structure. Mild mitral valve regurgitation. No evidence of mitral valve stenosis. Tricuspid Valve: The tricuspid valve is normal in structure. Tricuspid valve regurgitation is mild . No evidence of tricuspid stenosis. Aortic Valve: The aortic valve is normal in structure. Aortic valve regurgitation is not visualized. No aortic stenosis is present. Aortic valve mean gradient measures 7.0 mmHg. Aortic valve peak gradient measures 11.4 mmHg. Aortic valve area, by VTI measures 3.28 cm. Pulmonic Valve: The pulmonic valve was normal in structure. Pulmonic valve regurgitation is not visualized. No evidence of pulmonic stenosis. Aorta: The aortic root is normal in size and structure. Venous: The inferior vena cava is normal in size with greater than 50% respiratory variability, suggesting right atrial pressure of 3 mmHg. IAS/Shunts: No atrial level shunt detected by color flow Doppler.  LEFT VENTRICLE PLAX 2D LVIDd:         3.60 cm   Diastology LVIDs:         2.30 cm   LV e' medial:    3.92 cm/s LV PW:         1.40 cm   LV E/e' medial:  18.0 LV IVS:        1.40 cm   LV e' lateral:   2.61 cm/s LVOT diam:     2.00 cm   LV E/e' lateral: 27.1 LV SV:         115 LV SV Index:   77 LVOT Area:     3.14 cm  RIGHT VENTRICLE RV S prime:     16.90 cm/s TAPSE (M-mode): 2.4 cm LEFT ATRIUM           Index        RIGHT ATRIUM           Index LA diam:      2.80 cm 1.88 cm/m   RA Area:     12.70 cm LA Vol (A2C): 37.5 ml 25.20 ml/m  RA Volume:   23.50 ml  15.79 ml/m LA Vol (A4C):  38.5 ml 25.87 ml/m  AORTIC VALVE AV Area (Vmax):    3.27 cm AV Area (Vmean):   3.31 cm AV Area (VTI):     3.28 cm AV Vmax:  169.00 cm/s AV Vmean:          127.000 cm/s AV VTI:            0.350 m AV Peak Grad:      11.4 mmHg AV Mean Grad:      7.0 mmHg LVOT Vmax:         176.00 cm/s LVOT Vmean:        134.000 cm/s LVOT VTI:          0.365 m LVOT/AV VTI ratio: 1.04  AORTA Ao Root diam: 2.80 cm MITRAL VALVE               TRICUSPID VALVE MV Area (PHT): 2.52 cm    TR Peak grad:   25.8 mmHg MV Decel Time: 301 msec    TR Vmax:        254.00 cm/s MV E velocity: 70.70 cm/s MV A velocity: 89.10 cm/s  SHUNTS MV E/A ratio:  0.79        Systemic VTI:  0.36 m                            Systemic Diam: 2.00 cm Isaias Cowman MD Electronically signed by Isaias Cowman MD Signature Date/Time: 07/11/2022/5:00:50 PM    Final    MR BRAIN WO CONTRAST  Result Date: 07/10/2022 CLINICAL DATA:  [place] EXAM: MRI HEAD WITHOUT CONTRAST TECHNIQUE: Multiplanar, multiecho pulse sequences of the brain and surrounding structures were obtained without intravenous contrast. COMPARISON:  Head CT from 2 days ago FINDINGS: Brain: No acute infarction, hemorrhage, hydrocephalus, extra-axial collection or mass lesion. Chronic small vessel ischemia in the cerebral white matter, mild for age. Mild cerebral volume loss for age. Vascular: Normal flow voids. Skull and upper cervical spine: Normal marrow signal. Sinuses/Orbits: Right cataract resection. Other: Intermittent motion artifact, likely best obtainable. IMPRESSION: 1. Aging brain without acute or reversible finding. 2. Motion degraded. Electronically Signed   By: Jorje Guild M.D.   On: 07/10/2022 12:52        Scheduled Meds:  apixaban  2.5 mg Oral BID   aspirin EC  81 mg Oral Daily   azelastine  1 spray Each Nare BID   baclofen  20 mg Oral BID   calcium-vitamin D  1 tablet Oral Daily   loratadine  10 mg Oral Daily   losartan  50 mg Oral BID   metoprolol  tartrate  25 mg Oral BID   mirtazapine  15 mg Oral QHS   montelukast  10 mg Oral Daily   multivitamin with minerals  1 tablet Oral Q1500   multivitamin-lutein  1 capsule Oral BID   pantoprazole  40 mg Oral QAC breakfast   timolol  1 drop Both Eyes BID   Continuous Infusions:  diltiazem (CARDIZEM) infusion Stopped (07/11/22 2356)      LOS: 0 days    Time spent: 35 mins    Wyvonnia Dusky, MD [facility] Pager [phone]  If 7PM-7AM, please contact night-coverage [email or web address] 07/12/2022, 8:52 AM

## 2022-07-12 NOTE — Progress Notes (Signed)
[place] NOTE       Patient ID: Mary Thornton MRN: [id number] DOB/AGE: 1941-03-04 81 y.o.  Admit date: 07/08/2022 Referring Physician Dr. Eugenie Norrie Primary Physician Dr. Earlie Counts Primary Cardiologist Dr. Clayborn Bigness Reason for Consultation new onset AF RVR  HPI: Mary Thornton is an 53yoF with a PMH of paroxysmal SVT, hypertension, hyperlipidemia, GERD who presented to [facility] ED 07/08/2022 with generalized weakness and was less conversant over the past week prior to admission.  Family with concern for repeating stories at home and memory issues.  She was found to be in atrial fibrillation with RVR and was started on a diltiazem drip with conversion to sinus rhythm, p.o. diltiazem was discontinued on 9/10 but she converted back to A-fib with RVR overnight on 9/11.  Interval history: -was in atrial flutter with RVR the afternoon into evening of 9/11, diltiazem drip was restarted and  she converted back to sinus rhythm rate controlled in the 60s to 80s this morning. -Echo resulted with preserved LVEF and normal diastolic parameters. -feels better today, can't really point to specifics. No chest pain, sob, palpitations. -Discussed eliquis at length with patient and she feels very apprehensive about continuing it due to a fear of falling.   Review of systems complete and found to be negative unless listed above     Past Medical History:  Diagnosis Date   Arthritis    Asthma    Barrett esophagus    Complication of anesthesia    very hard to wake up. feels like she had too much medicine. 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 Herbert Deaner   COLONOSCOPY WITH PROPOFOL N/A 06/23/2015   Procedure: COLONOSCOPY WITH PROPOFOL;  Surgeon: [name], MD;  Location: [facility] ENDOSCOPY;  Service: Endoscopy;  Laterality: N/A;   COLONOSCOPY WITH PROPOFOL N/A 08/11/2020   Procedure: COLONOSCOPY WITH PROPOFOL;  Surgeon: Lesly Rubenstein, MD;  Location: [facility] ENDOSCOPY;  Service: Endoscopy;  Laterality: N/A;   ESOPHAGOGASTRODUODENOSCOPY (EGD) WITH PROPOFOL N/A 06/23/2015   Procedure: ESOPHAGOGASTRODUODENOSCOPY (EGD) WITH PROPOFOL;  Surgeon: [name], MD;  Location: [facility] ENDOSCOPY;  Service: Endoscopy;  Laterality: N/A;   ESOPHAGOGASTRODUODENOSCOPY (EGD) WITH PROPOFOL N/A 11/28/2017   Procedure: ESOPHAGOGASTRODUODENOSCOPY (EGD) WITH PROPOFOL;  Surgeon: [name], MD;  Location: [facility] ENDOSCOPY;  Service: Endoscopy;  Laterality: N/A;   ESOPHAGOGASTRODUODENOSCOPY (EGD) WITH PROPOFOL N/A 01/18/2018   Procedure: ESOPHAGOGASTRODUODENOSCOPY (EGD) WITH PROPOFOL;  Surgeon: [name], MD;  Location: [facility] ENDOSCOPY;  Service: Endoscopy;  Laterality: N/A;   ESOPHAGOGASTRODUODENOSCOPY (EGD) WITH PROPOFOL N/A 08/11/2020   Procedure: ESOPHAGOGASTRODUODENOSCOPY (EGD) WITH PROPOFOL;  Surgeon: Andrey Farmer  T, MD;  Location: [facility] ENDOSCOPY;  Service: Endoscopy;  Laterality: N/A;   JOINT REPLACEMENT Left 2009   total knee replacement   KNEE ARTHROSCOPY     LUMBAR LAMINECTOMY/DECOMPRESSION MICRODISCECTOMY N/A 08/22/2018   Procedure: LUMBAR LAMINECTOMY/DECOMPRESSION MICRODISCECTOMY 1 LEVEL-L2-5;  Surgeon: Meade Maw, MD;  Location:  [facility] ORS;  Service: Neurosurgery;  Laterality: N/A;   Torticollis surgery x 2      Medications Prior to Admission  Medication Sig Dispense Refill Last Dose   acetaminophen (TYLENOL) 325 MG tablet Take 650 mg by mouth every 6 (six) hours as needed.   07/07/2022   aspirin EC 81 MG tablet Take 81 mg by mouth daily.   07/07/2022   Azelastine HCl 137 MCG/SPRAY SOLN Place 1 spray into both nostrils 2 (two) times daily.   07/07/2022   baclofen (LIORESAL) 20 MG tablet Take 20 mg by mouth 2 (two) times daily.   Past Week   calcium-vitamin D (OSCAL WITH D) 500-200 MG-UNIT tablet Take 1 tablet by mouth daily.   07/07/2022   diltiazem (CARDIZEM) 60 MG tablet Take 60 mg by mouth 2 (two) times daily.   07/07/2022   erythromycin ophthalmic ointment Place 1 Application into the right eye 3 (three) times daily.   07/07/2022   loratadine (CLARITIN) 10 MG tablet Take 1 tablet (10 mg total) by mouth daily. 30 tablet 0 07/07/2022   losartan (COZAAR) 50 MG tablet Take 50 mg by mouth 2 (two) times daily.   07/07/2022   mirtazapine (REMERON) 15 MG tablet Take 15 mg by mouth at bedtime.   07/07/2022   montelukast (SINGULAIR) 10 MG tablet Take 10 mg by mouth daily.    07/07/2022   Multiple Vitamin (MULTIVITAMIN) tablet Take 1 tablet by mouth daily at 3 pm.    07/07/2022   Multiple Vitamins-Minerals (PRESERVISION AREDS PO) Take 2 capsules by mouth 2 (two) times daily. Take one capsule in the morning and one in the evening   07/07/2022   neomycin-polymyxin b-dexamethasone (MAXITROL) 3.5-10000-0.1 SUSP Place 1 drop into the right eye 3 (three) times daily.   07/07/2022   ofloxacin (FLOXIN) 0.3 % OTIC solution Place 10 drops into the right ear daily.   07/07/2022   pantoprazole (PROTONIX) 40 MG tablet Take 40 mg by mouth daily before breakfast.    07/07/2022   timolol (TIMOPTIC) 0.25 % ophthalmic solution Place 1 drop into both eyes 2 (two) times daily. '@0800'$ /1700   07/07/2022   tiZANidine (ZANAFLEX) 4 MG tablet Take by mouth.   07/07/2022   atorvastatin  (LIPITOR) 40 MG tablet Take 1 tablet (40 mg total) by mouth daily. 30 tablet 0    CARTIA XT 180 MG 24 hr capsule Take 180 mg by mouth 2 (two) times daily. (Patient not taking: Reported on 07/08/2022)   Not Taking   diclofenac (VOLTAREN) 75 MG EC tablet  (Patient not taking: Reported on 08/11/2020)      LAGEVRIO 200 MG CAPS capsule Take 4 capsules by mouth 2 (two) times daily. (Patient not taking: Reported on 07/08/2022)   Not Taking   metoprolol tartrate (LOPRESSOR) 25 MG tablet Take 0.5 tablets (12.5 mg total) by mouth 2 (two) times daily. (Patient not taking: Reported on 07/15/2021) 30 tablet 0    Social History   Socioeconomic History   Marital status: Widowed    Spouse name: Not on file   Number of children: Not on file   Years of education: Not on file   Highest education level: Not on file  Occupational History   Occupation: Special educational needs teacher    Comment: retired  Tobacco Use   Smoking status: Never   Smokeless tobacco: Never  Vaping Use   Vaping Use: Never used  Substance and Sexual Activity   Alcohol use: Never   Drug use: Never   Sexual activity: Not Currently    Birth control/protection: Surgical  Other Topics Concern   Not on file  Social History Narrative   Not on file   Social Determinants of Health   Financial Resource Strain: Not on file  Food Insecurity: No Food Insecurity (07/08/2022)   Hunger Vital Sign    Worried About Running Out of Food in the Last Year: Never true    [place] in the Last Year: Never true  Transportation Needs: No Transportation Needs (07/08/2022)   PRAPARE - Hydrologist (Medical): No    Lack of Transportation (Non-Medical): No  Physical Activity: Not on file  Stress: Not on file  Social Connections: Not on file  Intimate Partner Violence: Not At Risk (07/08/2022)   Humiliation, Afraid, Rape, and Kick questionnaire    Fear of Current or Ex-Partner: No    Emotionally Abused: No    Physically Abused: No     Sexually Abused: No    Family History  Problem Relation Age of Onset   Heart disease Mother    Heart disease Sister    Cancer Neg Hx    Diabetes Neg Hx    Breast cancer Neg Hx    Amblyopia Neg Hx    Blindness Neg Hx    Cataracts Neg Hx    Glaucoma Neg Hx    Macular degeneration Neg Hx    Retinal detachment Neg Hx    Strabismus Neg Hx    Retinitis pigmentosa Neg Hx      PHYSICAL EXAM General: elderly and frail appearing caucasian female in no acute distress. HEENT:  Normocephalic and atraumatic. Neck:  No JVD.  Lungs: Normal respiratory effort on room air. Clear bilaterally to auscultation. No wheezes, crackles, rhonchi.  Heart: HRRR . Normal S1 and S2 without gallops or murmurs.  Abdomen: Non-distended appearing.  Msk: Normal strength and tone for age. Extremities: Warm and well perfused. No clubbing, cyanosis. No peripheral edema.  Neuro: Alert and oriented X 3. Psych:  Answers questions appropriately.   Labs: Basic Metabolic Panel: Recent Labs    07/09/22 2045 07/10/22 0451 07/11/22 0515 07/12/22 0641  NA  --    < > 134* 137  K  --    < > 4.0 3.7  CL  --    < > 102 102  CO2  --    < > 26 28  GLUCOSE  --    < > 93 105*  BUN  --    < > 18 13  CREATININE  --    < > 0.48 0.49  CALCIUM  --    < > 8.3* 8.4*  MG 2.4  --   --   --    < > = values in this interval not displayed.   Liver Function Tests: No results for input(s): "AST", "ALT", "ALKPHOS", "BILITOT", "PROT", "ALBUMIN" in the last 72 hours. No results for input(s): "LIPASE", "AMYLASE" in the last 72 hours. CBC: Recent Labs    07/11/22 0515 07/12/22 0641  WBC 11.9* 10.4  HGB 12.9 11.9*  HCT 39.1 35.3*  MCV 95.1 95.9  PLT 301 293   Cardiac Enzymes: No  results for input(s): "CKTOTAL", "CKMB", "CKMBINDEX", "TROPONINIHS" in the last 72 hours. BNP: Invalid input(s): "POCBNP" D-Dimer: No results for input(s): "DDIMER" in the last 72 hours. Hemoglobin A1C: No results for input(s): "HGBA1C" in the  last 72 hours. Fasting Lipid Panel: No results for input(s): "CHOL", "HDL", "LDLCALC", "TRIG", "CHOLHDL", "LDLDIRECT" in the last 72 hours. Thyroid Function Tests: No results for input(s): "TSH", "T4TOTAL", "T3FREE", "THYROIDAB" in the last 72 hours.  Invalid input(s): "FREET3" Anemia Panel: No results for input(s): "VITAMINB12", "FOLATE", "FERRITIN", "TIBC", "IRON", "RETICCTPCT" in the last 72 hours.  ECHOCARDIOGRAM COMPLETE  Result Date: 07/11/2022    ECHOCARDIOGRAM REPORT   Patient Name:   Mary Thornton [facility] Date of Exam: 07/11/2022 Medical Rec #:  [id number]           Height:       63.0 in Accession #:    [id number]          Weight:       108.0 lb Date of Birth:  09-15-1941           BSA:          1.488 m Patient Age:    58 years            BP:           125/75 mmHg Patient Gender: F                   HR:           84 bpm. Exam Location:  [facility] Procedure: 2D Echo, Cardiac Doppler and Color Doppler Indications:     Endocarditis I38  History:         Patient has no prior history of Echocardiogram examinations.                  Risk Factors:Hypertension and Dyslipidemia.  Sonographer:     Sherrie Sport Referring Phys:  [place] Diagnosing Phys: Isaias Cowman MD IMPRESSIONS  1. Left ventricular ejection fraction, by estimation, is 65 to 70%. The left ventricle has normal function. The left ventricle has no regional wall motion abnormalities. Left ventricular diastolic parameters were normal.  2. Right ventricular systolic function is normal. The right ventricular size is normal.  3. The mitral valve is normal in structure. Mild mitral valve regurgitation. No evidence of mitral stenosis.  4. The aortic valve is normal in structure. Aortic valve regurgitation is not visualized. No aortic stenosis is present.  5. The inferior vena cava is normal in size with greater than 50% respiratory variability, suggesting right atrial pressure of 3 mmHg. FINDINGS  Left Ventricle: Left ventricular  ejection fraction, by estimation, is 65 to 70%. The left ventricle has normal function. The left ventricle has no regional wall motion abnormalities. The left ventricular internal cavity size was normal in size. There is  no left ventricular hypertrophy. Left ventricular diastolic parameters were normal. Right Ventricle: The right ventricular size is normal. No increase in right ventricular wall thickness. Right ventricular systolic function is normal. Left Atrium: Left atrial size was normal in size. Right Atrium: Right atrial size was normal in size. Pericardium: There is no evidence of pericardial effusion. Mitral Valve: The mitral valve is normal in structure. Mild mitral valve regurgitation. No evidence of mitral valve stenosis. Tricuspid Valve: The tricuspid valve is normal in structure. Tricuspid valve regurgitation is mild . No evidence of tricuspid stenosis. Aortic Valve: The aortic valve is normal in structure. Aortic valve regurgitation is not  visualized. No aortic stenosis is present. Aortic valve mean gradient measures 7.0 mmHg. Aortic valve peak gradient measures 11.4 mmHg. Aortic valve area, by VTI measures 3.28 cm. Pulmonic Valve: The pulmonic valve was normal in structure. Pulmonic valve regurgitation is not visualized. No evidence of pulmonic stenosis. Aorta: The aortic root is normal in size and structure. Venous: The inferior vena cava is normal in size with greater than 50% respiratory variability, suggesting right atrial pressure of 3 mmHg. IAS/Shunts: No atrial level shunt detected by color flow Doppler.  LEFT VENTRICLE PLAX 2D LVIDd:         3.60 cm   Diastology LVIDs:         2.30 cm   LV e' medial:    3.92 cm/s LV PW:         1.40 cm   LV E/e' medial:  18.0 LV IVS:        1.40 cm   LV e' lateral:   2.61 cm/s LVOT diam:     2.00 cm   LV E/e' lateral: 27.1 LV SV:         115 LV SV Index:   77 LVOT Area:     3.14 cm  RIGHT VENTRICLE RV S prime:     16.90 cm/s TAPSE (M-mode): 2.4 cm LEFT  ATRIUM           Index        RIGHT ATRIUM           Index LA diam:      2.80 cm 1.88 cm/m   RA Area:     12.70 cm LA Vol (A2C): 37.5 ml 25.20 ml/m  RA Volume:   23.50 ml  15.79 ml/m LA Vol (A4C): 38.5 ml 25.87 ml/m  AORTIC VALVE AV Area (Vmax):    3.27 cm AV Area (Vmean):   3.31 cm AV Area (VTI):     3.28 cm AV Vmax:           169.00 cm/s AV Vmean:          127.000 cm/s AV VTI:            0.350 m AV Peak Grad:      11.4 mmHg AV Mean Grad:      7.0 mmHg LVOT Vmax:         176.00 cm/s LVOT Vmean:        134.000 cm/s LVOT VTI:          0.365 m LVOT/AV VTI ratio: 1.04  AORTA Ao Root diam: 2.80 cm MITRAL VALVE               TRICUSPID VALVE MV Area (PHT): 2.52 cm    TR Peak grad:   25.8 mmHg MV Decel Time: 301 msec    TR Vmax:        254.00 cm/s MV E velocity: 70.70 cm/s MV A velocity: 89.10 cm/s  SHUNTS MV E/A ratio:  0.79        Systemic VTI:  0.36 m                            Systemic Diam: 2.00 cm Isaias Cowman MD Electronically signed by Isaias Cowman MD Signature Date/Time: 07/11/2022/5:00:50 PM    Final    MR BRAIN WO CONTRAST  Result Date: 07/10/2022 CLINICAL DATA:  Delirium EXAM: MRI HEAD WITHOUT CONTRAST TECHNIQUE: Multiplanar, multiecho pulse sequences of the brain and surrounding structures were  obtained without intravenous contrast. COMPARISON:  Head CT from 2 days ago FINDINGS: Brain: No acute infarction, hemorrhage, hydrocephalus, extra-axial collection or mass lesion. Chronic small vessel ischemia in the cerebral white matter, mild for age. Mild cerebral volume loss for age. Vascular: Normal flow voids. Skull and upper cervical spine: Normal marrow signal. Sinuses/Orbits: Right cataract resection. Other: Intermittent motion artifact, likely best obtainable. IMPRESSION: 1. Aging brain without acute or reversible finding. 2. Motion degraded. Electronically Signed   By: Jorje Guild M.D.   On: 07/10/2022 12:52     Radiology: ECHOCARDIOGRAM COMPLETE  Result Date: 07/11/2022     ECHOCARDIOGRAM REPORT   Patient Name:   Mary Thornton [facility] Date of Exam: 07/11/2022 Medical Rec #:  [id number]           Height:       63.0 in Accession #:    [id number]          Weight:       108.0 lb Date of Birth:  11/15/1940           BSA:          1.488 m Patient Age:    68 years            BP:           125/75 mmHg Patient Gender: F                   HR:           84 bpm. Exam Location:  [facility] Procedure: 2D Echo, Cardiac Doppler and Color Doppler Indications:     Endocarditis I38  History:         Patient has no prior history of Echocardiogram examinations.                  Risk Factors:Hypertension and Dyslipidemia.  Sonographer:     Sherrie Sport Referring Phys:  [place] Diagnosing Phys: Isaias Cowman MD IMPRESSIONS  1. Left ventricular ejection fraction, by estimation, is 65 to 70%. The left ventricle has normal function. The left ventricle has no regional wall motion abnormalities. Left ventricular diastolic parameters were normal.  2. Right ventricular systolic function is normal. The right ventricular size is normal.  3. The mitral valve is normal in structure. Mild mitral valve regurgitation. No evidence of mitral stenosis.  4. The aortic valve is normal in structure. Aortic valve regurgitation is not visualized. No aortic stenosis is present.  5. The inferior vena cava is normal in size with greater than 50% respiratory variability, suggesting right atrial pressure of 3 mmHg. FINDINGS  Left Ventricle: Left ventricular ejection fraction, by estimation, is 65 to 70%. The left ventricle has normal function. The left ventricle has no regional wall motion abnormalities. The left ventricular internal cavity size was normal in size. There is  no left ventricular hypertrophy. Left ventricular diastolic parameters were normal. Right Ventricle: The right ventricular size is normal. No increase in right ventricular wall thickness. Right ventricular systolic function is normal. Left Atrium: Left  atrial size was normal in size. Right Atrium: Right atrial size was normal in size. Pericardium: There is no evidence of pericardial effusion. Mitral Valve: The mitral valve is normal in structure. Mild mitral valve regurgitation. No evidence of mitral valve stenosis. Tricuspid Valve: The tricuspid valve is normal in structure. Tricuspid valve regurgitation is mild . No evidence of tricuspid stenosis. Aortic Valve: The aortic valve is normal in structure. Aortic valve regurgitation is  not visualized. No aortic stenosis is present. Aortic valve mean gradient measures 7.0 mmHg. Aortic valve peak gradient measures 11.4 mmHg. Aortic valve area, by VTI measures 3.28 cm. Pulmonic Valve: The pulmonic valve was normal in structure. Pulmonic valve regurgitation is not visualized. No evidence of pulmonic stenosis. Aorta: The aortic root is normal in size and structure. Venous: The inferior vena cava is normal in size with greater than 50% respiratory variability, suggesting right atrial pressure of 3 mmHg. IAS/Shunts: No atrial level shunt detected by color flow Doppler.  LEFT VENTRICLE PLAX 2D LVIDd:         3.60 cm   Diastology LVIDs:         2.30 cm   LV e' medial:    3.92 cm/s LV PW:         1.40 cm   LV E/e' medial:  18.0 LV IVS:        1.40 cm   LV e' lateral:   2.61 cm/s LVOT diam:     2.00 cm   LV E/e' lateral: 27.1 LV SV:         115 LV SV Index:   77 LVOT Area:     3.14 cm  RIGHT VENTRICLE RV S prime:     16.90 cm/s TAPSE (M-mode): 2.4 cm LEFT ATRIUM           Index        RIGHT ATRIUM           Index LA diam:      2.80 cm 1.88 cm/m   RA Area:     12.70 cm LA Vol (A2C): 37.5 ml 25.20 ml/m  RA Volume:   23.50 ml  15.79 ml/m LA Vol (A4C): 38.5 ml 25.87 ml/m  AORTIC VALVE AV Area (Vmax):    3.27 cm AV Area (Vmean):   3.31 cm AV Area (VTI):     3.28 cm AV Vmax:           169.00 cm/s AV Vmean:          127.000 cm/s AV VTI:            0.350 m AV Peak Grad:      11.4 mmHg AV Mean Grad:      7.0 mmHg LVOT Vmax:          176.00 cm/s LVOT Vmean:        134.000 cm/s LVOT VTI:          0.365 m LVOT/AV VTI ratio: 1.04  AORTA Ao Root diam: 2.80 cm MITRAL VALVE               TRICUSPID VALVE MV Area (PHT): 2.52 cm    TR Peak grad:   25.8 mmHg MV Decel Time: 301 msec    TR Vmax:        254.00 cm/s MV E velocity: 70.70 cm/s MV A velocity: 89.10 cm/s  SHUNTS MV E/A ratio:  0.79        Systemic VTI:  0.36 m                            Systemic Diam: 2.00 cm Isaias Cowman MD Electronically signed by Isaias Cowman MD Signature Date/Time: 07/11/2022/5:00:50 PM    Final    MR BRAIN WO CONTRAST  Result Date: 07/10/2022 CLINICAL DATA:  [place]irium EXAM: MRI HEAD WITHOUT CONTRAST TECHNIQUE: Multiplanar, multiecho pulse sequences of the brain and surrounding structures  were obtained without intravenous contrast. COMPARISON:  Head CT from 2 days ago FINDINGS: Brain: No acute infarction, hemorrhage, hydrocephalus, extra-axial collection or mass lesion. Chronic small vessel ischemia in the cerebral white matter, mild for age. Mild cerebral volume loss for age. Vascular: Normal flow voids. Skull and upper cervical spine: Normal marrow signal. Sinuses/Orbits: Right cataract resection. Other: Intermittent motion artifact, likely best obtainable. IMPRESSION: 1. Aging brain without acute or reversible finding. 2. Motion degraded. Electronically Signed   By: Jorje Guild M.D.   On: 07/10/2022 12:52   CT Head Wo Contrast  Result Date: 07/08/2022 CLINICAL DATA:  Altered mental status EXAM: CT HEAD WITHOUT CONTRAST TECHNIQUE: Contiguous axial images were obtained from the base of the skull through the vertex without intravenous contrast. RADIATION DOSE REDUCTION: This exam was performed according to the departmental dose-optimization program which includes automated exposure control, adjustment of the mA and/or kV according to patient size and/or use of iterative reconstruction technique. COMPARISON:  04/29/2022 FINDINGS: Brain: No acute  intracranial findings are seen in noncontrast CT brain. There are no signs of bleeding within the cranium. Cortical sulci are prominent. Small old lacunar infarcts are seen in basal ganglia. There is decreased density in periventricular white matter. Vascular: Unremarkable. Skull: Unremarkable. Sinuses/Orbits: No acute findings are seen. Other: None. IMPRESSION: No acute intracranial findings are seen a noncontrast CT brain. Atrophy. 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 Electronically Signed   By: Elmer Picker M.D.   On: 07/08/2022 18:25   DG Chest Port 1 View  Result Date: 07/08/2022 CLINICAL DATA:  Altered mental status EXAM: PORTABLE CHEST 1 VIEW COMPARISON:  10/30/2017 FINDINGS: No acute airspace disease or effusion. Borderline cardiac enlargement. Aortic atherosclerosis. No pneumothorax. IMPRESSION: No active disease.  Borderline cardiomegaly Electronically Signed   By: Donavan Foil M.D.   On: 07/08/2022 17:11    ECHO 07/11/2022 EF 65-70%  TELEMETRY reviewed by me (LT) 07/12/2022 : Atrial flutter with RVR rates 140s, converted to sinus rhythm rate 60s to 80s the morning of 9/12.  EKG reviewed by me: Atrial fibrillation with RVR, LBBB 9/11 at 2037, sinus bradycardia, rate 52, LBBB 9/12 at 0012  Data reviewed by me (LT) 07/12/2022: Hospitalist progress note, admission H&P, EKGs, telemetry, vitals, CBC, BMP, troponins, BNP, magnesium  ASSESSMENT AND PLAN:  Mary Thornton is an 49yoF with a PMH of paroxysmal SVT, hypertension, hyperlipidemia, GERD who presented to [facility] ED 07/08/2022 with generalized weakness and was less conversant over the past week prior to admission.  Family with concern for repeating stories at home and memory issues.  She was found to be in atrial fibrillation with RVR and was started on a diltiazem drip with conversion to sinus rhythm, p.o. diltiazem was discontinued on 9/10 but she converted back to A-fib with RVR overnight on  9/11.  #Generalized weakness, memory deficit #New onset paroxysmal atrial fibrillation versus flutter with RVR -Agree with current therapy per primary team -We will discontinue diltiazem drip, start Cardizem 120 mg CD -Continue metoprolol tartrate 25 mg twice daily -Decrease losartan from 50 mg to 25 mg twice a day -Disucssed at length the risks and benefits of continuing eliquis for stroke risk reduction with the patient. She feels apprehensive about doing to, although she has been tolerating it well through her hospitalization thus far. She expressed concerns about falling and hitting her head, although she doesn't remember having any falls recently. She wants to hold off on taking further doses of eliquis for  now but will think about it some more. Strongly recommend further discussions with family regarding continuing  Eliquis 2.5 mg twice a day for stroke risk reduction (meets criteria with weight 49kg, age 40), CHA2DS2-VASc 4 (age, sex, htn)   This patient's plan of care was discussed and created with Dr. Saralyn Pilar and he is in agreement.  Signed: Tristan Schroeder , PA-C 07/12/2022, 10:10 AM [facility]

## 2022-07-12 NOTE — TOC Progression Note (Signed)
Transition of Care [facility]) - Progression Note    Patient Details  Name: Mary Thornton MRN: [id number] Date of Birth: May 10, 1941  Transition of Care [facility]) CM/SW [place], [place] Phone Number: 07/12/2022, 9:30 AM  Clinical Narrative:     Bed offered from [facility], [facility] started [employer], approved. Per MD potential dc tomorrow, CSW has updated patient's son Luciana Axe and [facility].        Expected Discharge Plan and Services                                                 Social Determinants of Health (SDOH) Interventions    Readmission Risk Interventions     No data to display

## 2022-07-13 DIAGNOSIS — E785 Hyperlipidemia, unspecified: Secondary | ICD-10-CM | POA: Diagnosis not present

## 2022-07-13 DIAGNOSIS — R627 Adult failure to thrive: Secondary | ICD-10-CM | POA: Diagnosis not present

## 2022-07-13 DIAGNOSIS — Z7401 Bed confinement status: Secondary | ICD-10-CM | POA: Diagnosis not present

## 2022-07-13 DIAGNOSIS — R531 Weakness: Secondary | ICD-10-CM | POA: Diagnosis not present

## 2022-07-13 DIAGNOSIS — H40053 Ocular hypertension, bilateral: Secondary | ICD-10-CM | POA: Diagnosis not present

## 2022-07-13 DIAGNOSIS — I480 Paroxysmal atrial fibrillation: Secondary | ICD-10-CM | POA: Diagnosis not present

## 2022-07-13 DIAGNOSIS — I110 Hypertensive heart disease with heart failure: Secondary | ICD-10-CM | POA: Diagnosis not present

## 2022-07-13 DIAGNOSIS — Z20822 Contact with and (suspected) exposure to covid-19: Secondary | ICD-10-CM | POA: Diagnosis not present

## 2022-07-13 DIAGNOSIS — I5030 Unspecified diastolic (congestive) heart failure: Secondary | ICD-10-CM | POA: Diagnosis not present

## 2022-07-13 DIAGNOSIS — I2510 Atherosclerotic heart disease of native coronary artery without angina pectoris: Secondary | ICD-10-CM | POA: Diagnosis not present

## 2022-07-13 DIAGNOSIS — F5101 Primary insomnia: Secondary | ICD-10-CM | POA: Diagnosis not present

## 2022-07-13 DIAGNOSIS — I471 Supraventricular tachycardia: Secondary | ICD-10-CM | POA: Diagnosis not present

## 2022-07-13 DIAGNOSIS — J45909 Unspecified asthma, uncomplicated: Secondary | ICD-10-CM | POA: Diagnosis not present

## 2022-07-13 DIAGNOSIS — E782 Mixed hyperlipidemia: Secondary | ICD-10-CM | POA: Diagnosis not present

## 2022-07-13 DIAGNOSIS — I4891 Unspecified atrial fibrillation: Secondary | ICD-10-CM | POA: Diagnosis not present

## 2022-07-13 DIAGNOSIS — J4520 Mild intermittent asthma, uncomplicated: Secondary | ICD-10-CM | POA: Diagnosis not present

## 2022-07-13 DIAGNOSIS — F05 Delirium due to known physiological condition: Secondary | ICD-10-CM | POA: Diagnosis not present

## 2022-07-13 DIAGNOSIS — I10 Essential (primary) hypertension: Secondary | ICD-10-CM | POA: Diagnosis not present

## 2022-07-13 DIAGNOSIS — M6281 Muscle weakness (generalized): Secondary | ICD-10-CM | POA: Diagnosis not present

## 2022-07-13 DIAGNOSIS — Z96652 Presence of left artificial knee joint: Secondary | ICD-10-CM | POA: Diagnosis not present

## 2022-07-13 DIAGNOSIS — Z7901 Long term (current) use of anticoagulants: Secondary | ICD-10-CM | POA: Diagnosis not present

## 2022-07-13 DIAGNOSIS — I248 Other forms of acute ischemic heart disease: Secondary | ICD-10-CM | POA: Diagnosis not present

## 2022-07-13 DIAGNOSIS — R778 Other specified abnormalities of plasma proteins: Secondary | ICD-10-CM | POA: Diagnosis not present

## 2022-07-13 DIAGNOSIS — K219 Gastro-esophageal reflux disease without esophagitis: Secondary | ICD-10-CM

## 2022-07-13 LAB — CULTURE, BLOOD (ROUTINE X 2)
Culture: NO GROWTH
Culture: NO GROWTH

## 2022-07-13 LAB — BASIC METABOLIC PANEL
Anion gap: 5 (ref 5–15)
BUN: 15 mg/dL (ref 8–23)
CO2: 28 mmol/L (ref 22–32)
Calcium: 8.4 mg/dL — ABNORMAL LOW (ref 8.9–10.3)
Chloride: 105 mmol/L (ref 98–111)
Creatinine, Ser: 0.46 mg/dL (ref 0.44–1.00)
GFR, Estimated: >60 mL/min (ref >60)
Glucose, Bld: 100 mg/dL — ABNORMAL HIGH (ref 70–99)
Potassium: 3.8 mmol/L (ref 3.5–5.1)
Sodium: 138 mmol/L (ref 135–145)

## 2022-07-13 LAB — CBC
HCT: 35.7 % — ABNORMAL LOW (ref 36.0–46.0)
Hemoglobin: 11.7 g/dL — ABNORMAL LOW (ref 12.0–15.0)
MCH: 31.8 pg (ref 26.0–34.0)
MCHC: 32.8 g/dL (ref 30.0–36.0)
MCV: 97 fL (ref 80.0–100.0)
Platelets: 297 10*3/uL (ref 150–400)
RBC: 3.68 MIL/uL — ABNORMAL LOW (ref 3.87–5.11)
RDW: 13 % (ref 11.5–15.5)
WBC: 9.9 10*3/uL (ref 4.0–10.5)
nRBC: 0 % (ref 0.0–0.2)

## 2022-07-13 MED ORDER — MELATONIN 5 MG PO TABS: 5.0000 mg | ORAL_TABLET | Freq: Every evening | ORAL | 0 refills | Status: DC | PRN

## 2022-07-13 MED ORDER — METOPROLOL TARTRATE 25 MG PO TABS: 12.5000 mg | ORAL_TABLET | Freq: Two times a day (BID) | ORAL | 2 refills | Status: DC

## 2022-07-13 MED ORDER — APIXABAN 2.5 MG PO TABS: 2.5000 mg | ORAL_TABLET | Freq: Two times a day (BID) | ORAL | 2 refills | Status: DC

## 2022-07-13 MED ORDER — ENSURE ENLIVE PO LIQD
237.0000 mL | Freq: Two times a day (BID) | ORAL | Status: DC
Start: 2022-07-13 — End: 2022-07-13
  Administered 2022-07-13: 237 mL via ORAL

## 2022-07-13 MED ORDER — LOSARTAN POTASSIUM 25 MG PO TABS: 50.0000 mg | ORAL_TABLET | Freq: Two times a day (BID) | ORAL | 1 refills | Status: AC

## 2022-07-13 MED ORDER — DILTIAZEM HCL ER COATED BEADS 120 MG PO CP24: 120.0000 mg | ORAL_CAPSULE | Freq: Every day | ORAL | 1 refills | Status: DC

## 2022-07-13 MED ORDER — ENSURE ENLIVE PO LIQD: 237.0000 mL | Freq: Two times a day (BID) | ORAL | 12 refills | Status: AC

## 2022-07-13 MED ORDER — METOPROLOL TARTRATE 25 MG PO TABS: 12.5000 mg | ORAL_TABLET | Freq: Two times a day (BID) | ORAL | Status: DC

## 2022-07-13 NOTE — Progress Notes (Signed)
[facility] Encounter Note  Patient: Mary Thornton / Admit Date: 07/08/2022 / Date of Encounter: 07/13/2022, 7:48 AM   Subjective: Patient has not had any improvement of current condition  afib returned and was now reverted back to nsr with meds  Review of Systems: Positive for: Weakness Negative for: Vision change, hearing change, syncope, dizziness, nausea, vomiting,diarrhea, bloody stool, stomach pain, cough, congestion, diaphoresis, urinary frequency, urinary pain,skin lesions, skin rashes Others previously listed  Objective: Telemetry: Normal sinus rhythm Physical Exam: Blood pressure 138/72, pulse (!) 50, temperature 98.2 F (36.8 C), resp. rate 20, height '5\' 3"'$  (1.6 m), weight 49 kg, SpO2 100 %. Body mass index is 19.14 kg/m. General: Well developed, well nourished, in no acute distress. Head: Normocephalic, atraumatic, sclera non-icteric, no xanthomas, nares are without discharge. Neck: No apparent masses Lungs: Normal respirations with no wheezes, no rhonchi, no rales , no crackles   Heart: Regular rate and rhythm, normal S1 S2, 2+ apical murmur, no rub, no gallop, PMI is normal size and placement, carotid upstroke normal without bruit, jugular venous pressure normal Abdomen: Soft, non-tender, non-distended with normoactive bowel sounds. No hepatosplenomegaly. Abdominal aorta is normal size without bruit Extremities: No edema, no clubbing, no cyanosis, no ulcers,  Peripheral: 2+ radial, 2+ femoral, 2+ dorsal pedal pulses Neuro: Alert and oriented. Moves all extremities spontaneously. Psych:  Responds to questions appropriately with a normal affect.   Intake/Output Summary (Last 24 hours) at 07/13/2022 0748 Last data filed at 07/13/2022 0050 Gross per 24 hour  Intake 240 ml  Output 850 ml  Net -610 ml     Inpatient Medications:   apixaban  2.5 mg Oral BID   aspirin EC  81 mg Oral Daily   atorvastatin  40 mg Oral QHS   azelastine  1 spray  Each Nare BID   baclofen  20 mg Oral BID   calcium-vitamin D  1 tablet Oral Daily   diltiazem  120 mg Oral Daily   loratadine  10 mg Oral Daily   losartan  25 mg Oral BID   metoprolol tartrate  25 mg Oral BID   mirtazapine  15 mg Oral QHS   montelukast  10 mg Oral Daily   multivitamin with minerals  1 tablet Oral Q1500   multivitamin-lutein  1 capsule Oral BID   pantoprazole  40 mg Oral QAC breakfast   timolol  1 drop Both Eyes BID   Infusions:   Labs: Recent Labs    07/12/22 0641 07/13/22 0632  NA 137 138  K 3.7 3.8  CL 102 105  CO2 28 28  GLUCOSE 105* 100*  BUN 13 15  CREATININE 0.49 0.46  CALCIUM 8.4* 8.4*    No results for input(s): "AST", "ALT", "ALKPHOS", "BILITOT", "PROT", "ALBUMIN" in the last 72 hours.  Recent Labs    07/12/22 0641 07/13/22 0632  WBC 10.4 9.9  HGB 11.9* 11.7*  HCT 35.3* 35.7*  MCV 95.9 97.0  PLT 293 297    No results for input(s): "CKTOTAL", "CKMB", "TROPONINI" in the last 72 hours. Invalid input(s): "POCBNP" No results for input(s): "HGBA1C" in the last 72 hours.   Weights: Filed Weights   07/08/22 1700 07/08/22 2152  Weight: 47.6 kg 49 kg     Radiology/Studies:  ECHOCARDIOGRAM COMPLETE  Result Date: 07/11/2022    ECHOCARDIOGRAM REPORT   Patient Name:   Mary Thornton [facility] Date of Exam: 07/11/2022 Medical Rec #:  [id number]  Height:       63.0 in Accession #:    [id number]          Weight:       108.0 lb Date of Birth:  Nov 08, 1940           BSA:          1.488 m Patient Age:    81 years            BP:           125/75 mmHg Patient Gender: F                   HR:           84 bpm. Exam Location:  [facility] Procedure: 2D Echo, Cardiac Doppler and Color Doppler Indications:     Endocarditis I38  History:         Patient has no prior history of Echocardiogram examinations.                  Risk Factors:Hypertension and Dyslipidemia.  Sonographer:     Sherrie Sport Referring Phys:  [place] Diagnosing Phys: Isaias Cowman MD IMPRESSIONS  1. Left ventricular ejection fraction, by estimation, is 65 to 70%. The left ventricle has normal function. The left ventricle has no regional wall motion abnormalities. Left ventricular diastolic parameters were normal.  2. Right ventricular systolic function is normal. The right ventricular size is normal.  3. The mitral valve is normal in structure. Mild mitral valve regurgitation. No evidence of mitral stenosis.  4. The aortic valve is normal in structure. Aortic valve regurgitation is not visualized. No aortic stenosis is present.  5. The inferior vena cava is normal in size with greater than 50% respiratory variability, suggesting right atrial pressure of 3 mmHg. FINDINGS  Left Ventricle: Left ventricular ejection fraction, by estimation, is 65 to 70%. The left ventricle has normal function. The left ventricle has no regional wall motion abnormalities. The left ventricular internal cavity size was normal in size. There is  no left ventricular hypertrophy. Left ventricular diastolic parameters were normal. Right Ventricle: The right ventricular size is normal. No increase in right ventricular wall thickness. Right ventricular systolic function is normal. Left Atrium: Left atrial size was normal in size. Right Atrium: Right atrial size was normal in size. Pericardium: There is no evidence of pericardial effusion. Mitral Valve: The mitral valve is normal in structure. Mild mitral valve regurgitation. No evidence of mitral valve stenosis. Tricuspid Valve: The tricuspid valve is normal in structure. Tricuspid valve regurgitation is mild . No evidence of tricuspid stenosis. Aortic Valve: The aortic valve is normal in structure. Aortic valve regurgitation is not visualized. No aortic stenosis is present. Aortic valve mean gradient measures 7.0 mmHg. Aortic valve peak gradient measures 11.4 mmHg. Aortic valve area, by VTI measures 3.28 cm. Pulmonic Valve: The pulmonic valve was normal in  structure. Pulmonic valve regurgitation is not visualized. No evidence of pulmonic stenosis. Aorta: The aortic root is normal in size and structure. Venous: The inferior vena cava is normal in size with greater than 50% respiratory variability, suggesting right atrial pressure of 3 mmHg. IAS/Shunts: No atrial level shunt detected by color flow Doppler.  LEFT VENTRICLE PLAX 2D LVIDd:         3.60 cm   Diastology LVIDs:         2.30 cm   LV e' medial:    3.92 cm/s LV PW:  1.40 cm   LV E/e' medial:  18.0 LV IVS:        1.40 cm   LV e' lateral:   2.61 cm/s LVOT diam:     2.00 cm   LV E/e' lateral: 27.1 LV SV:         115 LV SV Index:   77 LVOT Area:     3.14 cm  RIGHT VENTRICLE RV S prime:     16.90 cm/s TAPSE (M-mode): 2.4 cm LEFT ATRIUM           Index        RIGHT ATRIUM           Index LA diam:      2.80 cm 1.88 cm/m   RA Area:     12.70 cm LA Vol (A2C): 37.5 ml 25.20 ml/m  RA Volume:   23.50 ml  15.79 ml/m LA Vol (A4C): 38.5 ml 25.87 ml/m  AORTIC VALVE AV Area (Vmax):    3.27 cm AV Area (Vmean):   3.31 cm AV Area (VTI):     3.28 cm AV Vmax:           169.00 cm/s AV Vmean:          127.000 cm/s AV VTI:            0.350 m AV Peak Grad:      11.4 mmHg AV Mean Grad:      7.0 mmHg LVOT Vmax:         176.00 cm/s LVOT Vmean:        134.000 cm/s LVOT VTI:          0.365 m LVOT/AV VTI ratio: 1.04  AORTA Ao Root diam: 2.80 cm MITRAL VALVE               TRICUSPID VALVE MV Area (PHT): 2.52 cm    TR Peak grad:   25.8 mmHg MV Decel Time: 301 msec    TR Vmax:        254.00 cm/s MV E velocity: 70.70 cm/s MV A velocity: 89.10 cm/s  SHUNTS MV E/A ratio:  0.79        Systemic VTI:  0.36 m                            Systemic Diam: 2.00 cm Isaias Cowman MD Electronically signed by Isaias Cowman MD Signature Date/Time: 07/11/2022/5:00:50 PM    Final    MR BRAIN WO CONTRAST  Result Date: 07/10/2022 CLINICAL DATA:  Delirium EXAM: MRI HEAD WITHOUT CONTRAST TECHNIQUE: Multiplanar, multiecho pulse sequences of  the brain and surrounding structures were obtained without intravenous contrast. COMPARISON:  Head CT from 2 days ago FINDINGS: Brain: No acute infarction, hemorrhage, hydrocephalus, extra-axial collection or mass lesion. Chronic small vessel ischemia in the cerebral white matter, mild for age. Mild cerebral volume loss for age. Vascular: Normal flow voids. Skull and upper cervical spine: Normal marrow signal. Sinuses/Orbits: Right cataract resection. Other: Intermittent motion artifact, likely best obtainable. IMPRESSION: 1. Aging brain without acute or reversible finding. 2. Motion degraded. Electronically Signed   By: Jorje Guild M.D.   On: 07/10/2022 12:52   CT Head Wo Contrast  Result Date: 07/08/2022 CLINICAL DATA:  Altered mental status EXAM: CT HEAD WITHOUT CONTRAST TECHNIQUE: Contiguous axial images were obtained from the base of the skull through the vertex without intravenous contrast. RADIATION DOSE REDUCTION: This exam was performed according to the  departmental dose-optimization program which includes automated exposure control, adjustment of the mA and/or kV according to patient size and/or use of iterative reconstruction technique. COMPARISON:  04/29/2022 FINDINGS: Brain: No acute intracranial findings are seen in noncontrast CT brain. There are no signs of bleeding within the cranium. Cortical sulci are prominent. Small old lacunar infarcts are seen in basal ganglia. There is decreased density in periventricular white matter. Vascular: Unremarkable. Skull: Unremarkable. Sinuses/Orbits: No acute findings are seen. Other: None. IMPRESSION: No acute intracranial findings are seen a noncontrast CT brain. Atrophy. 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 Electronically Signed   By: Elmer Picker M.D.   On: 07/08/2022 18:25   DG Chest Port 1 View  Result Date: 07/08/2022 CLINICAL DATA:  Altered mental status EXAM: PORTABLE CHEST 1 VIEW COMPARISON:  10/30/2017  FINDINGS: No acute airspace disease or effusion. Borderline cardiac enlargement. Aortic atherosclerosis. No pneumothorax. IMPRESSION: No active disease.  Borderline cardiomegaly Electronically Signed   By: Donavan Foil M.D.   On: 07/08/2022 17:11     Assessment and Recommendation  81 y.o. female with no evidence of previous cardiovascular history with hypertension and apparent previous history of afib coming in with weakness and fatigue and no current evidence of congestive heart failure and/or non-ST elevation myocardial infarction with heart murmur   1.  Continue medication management for maintenance of normal sinus rhythm and reduction of evidence of afib including metoprolol 2.  Continue to pursue primary causes of weakness and fatigue and slight elevation of white blood cell count including  if necessary 3.   Ambulate and consider dc to rehab    Signed, Serafina Royals M.D. FACC

## 2022-07-13 NOTE — Discharge Summary (Signed)
Physician Discharge Summary   Patient: Mary Thornton MRN: [id number] DOB: 1940/11/08  Admit date:     07/08/2022  Discharge date: 07/13/22  Discharge Physician: Annita Brod   PCP: Idelle Crouch, MD   Recommendations at discharge:   New medication: Melatonin nightly as needed for sleep New medication: Eliquis 2.5 p.o. twice daily New medication: Cardizem CD 120 p.o. daily Medication change: Cardizem 60 mg p.o. twice daily discontinued Medication change: Cozaar decreased from 50 mg to 25 mg p.o. daily Medication change: Lopressor 12.5 mg p.o. twice daily restarted Medication change: Zanaflex 4 mg as needed discontinued Patient being discharged to skilled nursing  Discharge Diagnoses: Principal Problem:   Atrial fibrillation with RVR ([place]) Active Problems:   Essential hypertension   Dyslipidemia   GERD without esophagitis   Coronary artery disease  Resolved Problems:   * No resolved hospital problems. *  Hospital Course: 81 year old Caucasian female with past medical history of hypertension, PSVT and asthma who presented to the emergency room on 9/8 with confusion and generalized weakness that have been going on for several days.  In the emergency room, patient reportedly found to have new onset rapid atrial fibrillation and started on Cardizem drip.  Also started on Eliquis.  Patient converted back to a normal sinus rhythm by 9/10 and Cardizem drip discontinued and p.o. medications adjusted to include Cardizem and metoprolol.  However, she converted back into atrial flutter with RVR on evening of 9/11 and Cardizem drip needed to be restarted.  Echocardiogram was unrevealing.  Patient was seen by PT and OT who recommended skilled nursing.  Assessment and Plan: A. fib: w/ RVR. No longer in a. fib. New onset. Started back on po diltiazem at '120mg'$  CD daily, increased dose of metoprolol '25mg'$  BID, but then had episodes of bradycardia so currently on 12.5 p.o. twice daily.   Also started on Eliquis.  Outpatient cardiology follow-up.   CAD: w/ elevated troponins. Minimally elevated troponins are secondary to demand ischemia    GERD: continue on PPI    HLD: continue on statin    HTN: Continued on metoprolol with decreased dose of losartan.   Generalized weakness: Seen by PT and OT who are recommending skilled nursing.   Memory deficit: w/ likely sundowning. No formal dx of dementia. CT head & MRI brain shows no acute intracranial findings. Pt evidently repeating stories at home as per pt's daughters.  Urine and blood cultures with no growth.      Consultants: Cardiology Procedures performed: Echocardiogram noting preserved ejection fraction Disposition: Skilled nursing facility Diet recommendation:  Discharge Diet Orders (From admission, onward)     Start     Ordered   07/13/22 0000  Diet general        07/13/22 1135           Regular diet DISCHARGE MEDICATION: Allergies as of 07/13/2022       Reactions   Ibuprofen Other (See Comments)   History of esophagogastritis and barretts esophagus No bc powder or like products   Chlorhexidine Rash   Cymbalta [duloxetine Hcl] Itching   Penicillins Itching, Rash   Has patient had a PCN reaction causing immediate rash, facial/tongue/throat swelling, SOB or lightheadedness with hypotension: No Has patient had a PCN reaction causing severe rash involving mucus membranes or skin necrosis: No Has patient had a PCN reaction that required hospitalization: No Has patient had a PCN reaction occurring within the last 10 years: No If all of the above answers are "  NO", then may proceed with Cephalosporin use.   Sulfa Antibiotics Rash        Medication List     STOP taking these medications    diltiazem 60 MG tablet Commonly known as: CARDIZEM   erythromycin ophthalmic ointment   neomycin-polymyxin b-dexamethasone 3.5-10000-0.1 Susp Commonly known as: MAXITROL   ofloxacin 0.3 % OTIC  solution Commonly known as: FLOXIN   tiZANidine 4 MG tablet Commonly known as: ZANAFLEX       TAKE these medications    acetaminophen 325 MG tablet Commonly known as: TYLENOL Take 650 mg by mouth every 6 (six) hours as needed.   apixaban 2.5 MG Tabs tablet Commonly known as: ELIQUIS Take 1 tablet (2.5 mg total) by mouth 2 (two) times daily.   aspirin EC 81 MG tablet Take 81 mg by mouth daily.   atorvastatin 40 MG tablet Commonly known as: Lipitor Take 1 tablet (40 mg total) by mouth daily.   Azelastine HCl 137 MCG/SPRAY Soln Place 1 spray into both nostrils 2 (two) times daily.   baclofen 20 MG tablet Commonly known as: LIORESAL Take 20 mg by mouth 2 (two) times daily.   calcium-vitamin D 500-200 MG-UNIT tablet Commonly known as: OSCAL WITH D Take 1 tablet by mouth daily.   diltiazem 120 MG 24 hr capsule Commonly known as: CARDIZEM CD Take 1 capsule (120 mg total) by mouth daily. Start taking on: July 14, 2022 What changed:  medication strength how much to take when to take this   feeding supplement Liqd Take 237 mLs by mouth 2 (two) times daily between meals.   loratadine 10 MG tablet Commonly known as: Claritin Take 1 tablet (10 mg total) by mouth daily.   losartan 25 MG tablet Commonly known as: COZAAR Take 2 tablets (50 mg total) by mouth 2 (two) times daily. What changed: medication strength   melatonin 5 MG Tabs Take 1 tablet (5 mg total) by mouth at bedtime as needed.   metoprolol tartrate 25 MG tablet Commonly known as: LOPRESSOR Take 0.5 tablets (12.5 mg total) by mouth 2 (two) times daily.   mirtazapine 15 MG tablet Commonly known as: REMERON Take 15 mg by mouth at bedtime.   montelukast 10 MG tablet Commonly known as: SINGULAIR Take 10 mg by mouth daily.   multivitamin tablet Take 1 tablet by mouth daily at 3 pm.   pantoprazole 40 MG tablet Commonly known as: PROTONIX Take 40 mg by mouth daily before breakfast.    PRESERVISION AREDS PO Take 2 capsules by mouth 2 (two) times daily. Take one capsule in the morning and one in the evening   timolol 0.25 % ophthalmic solution Commonly known as: TIMOPTIC Place 1 drop into both eyes 2 (two) times daily. '@0800'$ /1700        Follow-up Information     Callwood, Dwayne D, MD. Go in 1 week(s).   Specialties: Cardiology, Internal Medicine Why: Appointment on Contact information: [place] [address] [phone]                Discharge Exam: Danley Danker Weights   07/08/22 1700 07/08/22 2152  Weight: 47.6 kg 49 kg   General: Alert and oriented x2, no acute distress Cardiovascular: Currently regular rate and rhythm, S1-S2 Lungs: Clear to auscultation bilaterally  Condition at discharge: good  The results of significant diagnostics from this hospitalization (including imaging, microbiology, ancillary and laboratory) are listed below for reference.   Imaging Studies: ECHOCARDIOGRAM COMPLETE  Result Date:  07/11/2022    ECHOCARDIOGRAM REPORT   Patient Name:   DEZIRE TURK [facility] Date of Exam: 07/11/2022 Medical Rec #:  [id number]           Height:       63.0 in Accession #:    [id number]          Weight:       108.0 lb Date of Birth:  [birth year]           BSA:          1.488 m Patient Age:    30 years            BP:           125/75 mmHg Patient Gender: F                   HR:           84 bpm. Exam Location:  [facility] Procedure: 2D Echo, Cardiac Doppler and Color Doppler Indications:     Endocarditis I38  History:         Patient has no prior history of Echocardiogram examinations.                  Risk Factors:Hypertension and Dyslipidemia.  Sonographer:     Sherrie Sport Referring Phys:  [place] Diagnosing Phys: Isaias Cowman MD IMPRESSIONS  1. Left ventricular ejection fraction, by estimation, is 65 to 70%. The left ventricle has normal function. The left ventricle has no regional wall motion abnormalities. Left  ventricular diastolic parameters were normal.  2. Right ventricular systolic function is normal. The right ventricular size is normal.  3. The mitral valve is normal in structure. Mild mitral valve regurgitation. No evidence of mitral stenosis.  4. The aortic valve is normal in structure. Aortic valve regurgitation is not visualized. No aortic stenosis is present.  5. The inferior vena cava is normal in size with greater than 50% respiratory variability, suggesting right atrial pressure of 3 mmHg. FINDINGS  Left Ventricle: Left ventricular ejection fraction, by estimation, is 65 to 70%. The left ventricle has normal function. The left ventricle has no regional wall motion abnormalities. The left ventricular internal cavity size was normal in size. There is  no left ventricular hypertrophy. Left ventricular diastolic parameters were normal. Right Ventricle: The right ventricular size is normal. No increase in right ventricular wall thickness. Right ventricular systolic function is normal. Left Atrium: Left atrial size was normal in size. Right Atrium: Right atrial size was normal in size. Pericardium: There is no evidence of pericardial effusion. Mitral Valve: The mitral valve is normal in structure. Mild mitral valve regurgitation. No evidence of mitral valve stenosis. Tricuspid Valve: The tricuspid valve is normal in structure. Tricuspid valve regurgitation is mild . No evidence of tricuspid stenosis. Aortic Valve: The aortic valve is normal in structure. Aortic valve regurgitation is not visualized. No aortic stenosis is present. Aortic valve mean gradient measures 7.0 mmHg. Aortic valve peak gradient measures 11.4 mmHg. Aortic valve area, by VTI measures 3.28 cm. Pulmonic Valve: The pulmonic valve was normal in structure. Pulmonic valve regurgitation is not visualized. No evidence of pulmonic stenosis. Aorta: The aortic root is normal in size and structure. Venous: The inferior vena cava is normal in size with  greater than 50% respiratory variability, suggesting right atrial pressure of 3 mmHg. IAS/Shunts: No atrial level shunt detected by color flow Doppler.  LEFT VENTRICLE PLAX 2D LVIDd:  3.60 cm   Diastology LVIDs:         2.30 cm   LV e' medial:    3.92 cm/s LV PW:         1.40 cm   LV E/e' medial:  18.0 LV IVS:        1.40 cm   LV e' lateral:   2.61 cm/s LVOT diam:     2.00 cm   LV E/e' lateral: 27.1 LV SV:         115 LV SV Index:   77 LVOT Area:     3.14 cm  RIGHT VENTRICLE RV S prime:     16.90 cm/s TAPSE (M-mode): 2.4 cm LEFT ATRIUM           Index        RIGHT ATRIUM           Index LA diam:      2.80 cm 1.88 cm/m   RA Area:     12.70 cm LA Vol (A2C): 37.5 ml 25.20 ml/m  RA Volume:   23.50 ml  15.79 ml/m LA Vol (A4C): 38.5 ml 25.87 ml/m  AORTIC VALVE AV Area (Vmax):    3.27 cm AV Area (Vmean):   3.31 cm AV Area (VTI):     3.28 cm AV Vmax:           169.00 cm/s AV Vmean:          127.000 cm/s AV VTI:            0.350 m AV Peak Grad:      11.4 mmHg AV Mean Grad:      7.0 mmHg LVOT Vmax:         176.00 cm/s LVOT Vmean:        134.000 cm/s LVOT VTI:          0.365 m LVOT/AV VTI ratio: 1.04  AORTA Ao Root diam: 2.80 cm MITRAL VALVE               TRICUSPID VALVE MV Area (PHT): 2.52 cm    TR Peak grad:   25.8 mmHg MV Decel Time: 301 msec    TR Vmax:        254.00 cm/s MV E velocity: 70.70 cm/s MV A velocity: 89.10 cm/s  SHUNTS MV E/A ratio:  0.79        Systemic VTI:  0.36 m                            Systemic Diam: 2.00 cm Isaias Cowman MD Electronically signed by Isaias Cowman MD Signature Date/Time: 07/11/2022/5:00:50 PM    Final    MR BRAIN WO CONTRAST  Result Date: 07/10/2022 CLINICAL DATA:  [place] EXAM: MRI HEAD WITHOUT CONTRAST TECHNIQUE: Multiplanar, multiecho pulse sequences of the brain and surrounding structures were obtained without intravenous contrast. COMPARISON:  Head CT from 2 days ago FINDINGS: Brain: No acute infarction, hemorrhage, hydrocephalus, extra-axial  collection or mass lesion. Chronic small vessel ischemia in the cerebral white matter, mild for age. Mild cerebral volume loss for age. Vascular: Normal flow voids. Skull and upper cervical spine: Normal marrow signal. Sinuses/Orbits: Right cataract resection. Other: Intermittent motion artifact, likely best obtainable. IMPRESSION: 1. Aging brain without acute or reversible finding. 2. Motion degraded. Electronically Signed   By: Jorje Guild M.D.   On: 07/10/2022 12:52   CT Head Wo Contrast  Result Date: 07/08/2022 CLINICAL DATA:  Altered mental status EXAM: CT HEAD WITHOUT CONTRAST TECHNIQUE: Contiguous axial images were obtained from the base of the skull through the vertex without intravenous contrast. RADIATION DOSE REDUCTION: This exam was performed according to the departmental dose-optimization program which includes automated exposure control, adjustment of the mA and/or kV according to patient size and/or use of iterative reconstruction technique. COMPARISON:  04/29/2022 FINDINGS: Brain: No acute intracranial findings are seen in noncontrast CT brain. There are no signs of bleeding within the cranium. Cortical sulci are prominent. Small old lacunar infarcts are seen in basal ganglia. There is decreased density in periventricular white matter. Vascular: Unremarkable. Skull: Unremarkable. Sinuses/Orbits: No acute findings are seen. Other: None. IMPRESSION: No acute intracranial findings are seen a noncontrast CT brain. Atrophy. 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 Electronically Signed   By: Elmer Picker M.D.   On: 07/08/2022 18:25   DG Chest Port 1 View  Result Date: 07/08/2022 CLINICAL DATA:  Altered mental status EXAM: PORTABLE CHEST 1 VIEW COMPARISON:  10/30/2017 FINDINGS: No acute airspace disease or effusion. Borderline cardiac enlargement. Aortic atherosclerosis. No pneumothorax. IMPRESSION: No active disease.  Borderline cardiomegaly Electronically  Signed   By: Donavan Foil M.D.   On: 07/08/2022 17:11    Microbiology: Results for orders placed or performed during the hospital encounter of 07/08/22  Resp Panel by RT-PCR (Flu A&B, Covid) Anterior Nasal Swab     Status: None   Collection Time: 07/08/22  4:37 PM   Specimen: Anterior Nasal Swab  Result Value Ref Range Status   SARS Coronavirus 2 by RT PCR NEGATIVE NEGATIVE Final    Comment: (NOTE) SARS-CoV-2 target nucleic acids are NOT DETECTED.  The SARS-CoV-2 RNA is generally detectable in upper respiratory specimens during the acute phase of infection. The lowest concentration of SARS-CoV-2 viral copies this assay can detect is 138 copies/mL. A negative result does not preclude SARS-Cov-2 infection and should not be used as the sole basis for treatment or other patient management decisions. A negative result may occur with  improper specimen collection/handling, submission of specimen other than nasopharyngeal swab, presence of viral mutation(s) within the areas targeted by this assay, and inadequate number of viral copies(<138 copies/mL). A negative result must be combined with clinical observations, patient history, and epidemiological information. The expected result is Negative.  Fact Sheet for Patients:  EntrepreneurPulse.com.au  Fact Sheet for Healthcare Providers:  IncredibleEmployment.be  This test is no t yet approved or cleared by the [place] FDA and  has been authorized for detection and/or diagnosis of SARS-CoV-2 by FDA under an Emergency Use Authorization (EUA). This EUA will remain  in effect (meaning this test can be used) for the duration of the COVID-19 declaration under Section 564(b)(1) of the Act, 21 U.S.C.section 360bbb-3(b)(1), unless the authorization is terminated  or revoked sooner.       Influenza A by PCR NEGATIVE NEGATIVE Final   Influenza B by PCR NEGATIVE NEGATIVE Final    Comment: (NOTE) The  Xpert Xpress SARS-CoV-2/FLU/RSV plus assay is intended as an aid in the diagnosis of influenza from Nasopharyngeal swab specimens and should not be used as a sole basis for treatment. Nasal washings and aspirates are unacceptable for Xpert Xpress SARS-CoV-2/FLU/RSV testing.  Fact Sheet for Patients: EntrepreneurPulse.com.au  Fact Sheet for Healthcare Providers: IncredibleEmployment.be  This test is not yet approved or cleared by the [place] FDA and has been authorized for detection and/or diagnosis of SARS-CoV-2 by FDA under an Emergency Use  Authorization (EUA). This EUA will remain in effect (meaning this test can be used) for the duration of the COVID-19 declaration under Section 564(b)(1) of the Act, 21 U.S.C. section 360bbb-3(b)(1), unless the authorization is terminated or revoked.  Performed at [facility], [address] [place], [place] [address]   Blood Culture (routine x 2)     Status: None   Collection Time: 07/08/22  4:40 PM   Specimen: Right Antecubital; Blood  Result Value Ref Range Status   Specimen Description RIGHT ANTECUBITAL  Final   Special Requests   Final    BOTTLES DRAWN AEROBIC AND ANAEROBIC Blood Culture results may not be optimal due to an inadequate volume of blood received in culture bottles   Culture   Final    NO GROWTH 5 DAYS Performed at [facility], [address] [place], [place] [address]    Report Status 07/13/2022 FINAL  Final  Blood Culture (routine x 2)     Status: None   Collection Time: 07/08/22  4:45 PM   Specimen: Right Antecubital; Blood  Result Value Ref Range Status   Specimen Description RIGHT ANTECUBITAL  Final   Special Requests   Final    BOTTLES DRAWN AEROBIC AND ANAEROBIC Blood Culture results may not be optimal due to an inadequate volume of blood received in culture bottles   Culture   Final    NO GROWTH 5 DAYS Performed at [facility], [address], [place], [place] [address]    Report Status 07/13/2022 FINAL  Final  Urine Culture     Status: Abnormal   Collection Time: 07/08/22  4:48 PM   Specimen: In/Out Cath Urine  Result Value Ref Range Status   Specimen Description   Final    IN/OUT CATH URINE Performed at [facility], [address], [place], [place] [address]    Special Requests   Final    NONE Performed at [facility], [address] [place], [place] [address]    Culture MULTIPLE SPECIES PRESENT, SUGGEST RECOLLECTION (A)  Final   Report Status 07/10/2022 FINAL  Final  Urine Culture     Status: None   Collection Time: 07/10/22 11:03 AM   Specimen: Urine, Clean Catch  Result Value Ref Range Status   Specimen Description   Final    URINE, CLEAN CATCH Performed at [facility], [address], [place], [place] [address]    Special Requests   Final    NONE Performed at [facility], [address], [place], [place] [address]    Culture   Final    NO GROWTH Performed at [facility], [place] [address], [place],  [address]    Report Status 07/11/2022 FINAL  Final    Labs: CBC: Recent Labs  Lab 07/08/22 1637 07/09/22 0535 07/10/22 0451 07/11/22 0515 07/12/22 0641 07/13/22 0632  WBC 9.7 11.6* 14.3* 11.9* 10.4 9.9  NEUTROABS 7.6  --   --   --   --   --   HGB 13.0 12.8 12.0 12.9 11.9* 11.7*  HCT 39.0 38.2 35.7* 39.1 35.3* 35.7*  MCV 95.6 95.3 94.7 95.1 95.9 97.0  PLT 319 293 282 301 293 212   Basic Metabolic Panel: Recent Labs  Lab 07/09/22 0535 07/09/22 2045 07/10/22 0451 07/11/22 0515 07/12/22 0641 07/13/22 0632  NA 137  --  133* 134* 137 138  K 3.8  --  3.7 4.0 3.7 3.8  CL 104  --  101 102 102 105  CO2 25  --  '29 26 28 28  '$ GLUCOSE 119*  --  117* 93 105* 100*  BUN 12  --  '18 18 13 15  '$ CREATININE 0.51  --  0.56 0.48 0.49 0.46  CALCIUM 8.6*  --  8.0* 8.3* 8.4* 8.4*  MG  --  2.4  --   --   --   --    Liver Function Tests: Recent  Labs  Lab 07/08/22 1637  AST 32  ALT 34  ALKPHOS 69  BILITOT 0.7  PROT 6.3*  ALBUMIN 4.2   CBG: Recent Labs  Lab 07/10/22 0740  GLUCAP 135*    Discharge time spent: less than 30 minutes.  Signed: Annita Brod, MD [facility] 07/13/2022

## 2022-07-13 NOTE — Hospital Course (Signed)
81 year old Caucasian female with past medical history of hypertension, PSVT and asthma who presented to the emergency room on 9/8 with confusion and generalized weakness that have been going on for several days.  In the emergency room, patient reportedly found to have new onset rapid atrial fibrillation and started on Cardizem drip.  Also started on Eliquis.  Patient converted back to a normal sinus rhythm by 9/10 and Cardizem drip discontinued and p.o. medications adjusted to include Cardizem and metoprolol.  However, she converted back into atrial flutter with RVR on evening of 9/11 and Cardizem drip needed to be restarted.  Echocardiogram was unrevealing.  Patient was seen by PT and OT who recommended skilled nursing.

## 2022-07-13 NOTE — Progress Notes (Signed)
Initial Nutrition Assessment  DOCUMENTATION CODES:   Not applicable  INTERVENTION:   -Ensure Enlive po BID, each supplement provides 350 kcal and 20 grams of protein -Liberalize diet to regular for wider variety of meal selections -MVI with minerals daily  NUTRITION DIAGNOSIS:   Increased nutrient needs related to acute illness as evidenced by estimated needs.  GOAL:   Patient will meet greater than or equal to 90% of their needs  MONITOR:   PO intake, Supplement acceptance  REASON FOR ASSESSMENT:   Malnutrition Screening Tool    ASSESSMENT:   Pt with medical history significant for asthma, osteoarthritis, diverticulosis, GERD, hypertension, dyslipidemia and PSVT as well as GERD, who presented with acute onset of generalized weakness and altered mental status over the last week.  Pt admitted with a-fib with RVR.   Reviewed I/O's: -610 ml x 24 hours and -8.1 L since admission  UOP: 850 ml x 24 hours  Pt working with physical therapy at time of visit. RD unable to obtain further nutrition-related history or complete nutrition-focused physical exam at this time.    Per MD notes, pt may discharge home today.    Pt currently on a heart healthy diet and consuming 100% of meals.  Reviewed wt hx; wt has been stable over the past 2 months.   Medications reviewed and include calcium with vitamin D, cardizem, and remeron.   Labs reviewed: CBGS: 135.   Diet Order:   Diet Order             Diet regular Room service appropriate? 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  Estimated Nutritional Needs:   Kcal:  1500-1700  Protein:  75-90 grams  Fluid:  > 1.5 L    Loistine Chance, RD, LDN, [place] Registered Dietitian II Certified Diabetes Care and Education Specialist Please refer to [facility] for RD and/or RD on-call/weekend/after hours pager

## 2022-07-13 NOTE — TOC Transition Note (Signed)
Transition of Care [facility]) - CM/SW Discharge Note   Patient Details  Name: Mary Thornton MRN: [id number] Date of Birth: 1941/02/19  [facility] CM/SW Contact:  Alberteen Sam, LCSW Phone Number: 07/13/2022, 11:59 AM   Clinical Narrative:     Patient will DC [facility] Anticipated DC date: 07/13/22 Family notified:  Luciana Axe Transport [id number]  Per MD patient ready for DC to [facility]. RN, patient, patient's family, and facility notified of DC. Discharge Summary sent to facility. RN given number for report  [phone]. DC packet on chart. Ambulance transport requested for patient.  CSW signing off.  Pricilla Riffle, LCSW    Final next level of care: [place] Barriers to Discharge: No Barriers Identified   Patient Goals and CMS Choice        Discharge Placement              Patient chooses bed at: [facility]     Patient and family notified of of transfer: 07/13/22  Discharge Plan and Services                                     Social Determinants of Health (SDOH) Interventions     Readmission Risk Interventions     No data to display

## 2022-07-15 DIAGNOSIS — I480 Paroxysmal atrial fibrillation: Secondary | ICD-10-CM | POA: Diagnosis not present

## 2022-07-15 DIAGNOSIS — F5101 Primary insomnia: Secondary | ICD-10-CM | POA: Diagnosis not present

## 2022-07-15 DIAGNOSIS — M6281 Muscle weakness (generalized): Secondary | ICD-10-CM | POA: Diagnosis not present

## 2022-07-15 DIAGNOSIS — J4520 Mild intermittent asthma, uncomplicated: Secondary | ICD-10-CM | POA: Diagnosis not present

## 2022-07-15 DIAGNOSIS — I10 Essential (primary) hypertension: Secondary | ICD-10-CM | POA: Diagnosis not present

## 2022-07-15 DIAGNOSIS — K219 Gastro-esophageal reflux disease without esophagitis: Secondary | ICD-10-CM | POA: Diagnosis not present

## 2022-07-15 DIAGNOSIS — E782 Mixed hyperlipidemia: Secondary | ICD-10-CM | POA: Diagnosis not present

## 2022-07-15 DIAGNOSIS — I2510 Atherosclerotic heart disease of native coronary artery without angina pectoris: Secondary | ICD-10-CM | POA: Diagnosis not present

## 2022-07-15 DIAGNOSIS — H40053 Ocular hypertension, bilateral: Secondary | ICD-10-CM | POA: Diagnosis not present

## 2022-07-18 ENCOUNTER — Inpatient Hospital Stay: Payer: Medicare HMO

## 2022-07-18 DIAGNOSIS — F5101 Primary insomnia: Secondary | ICD-10-CM | POA: Diagnosis not present

## 2022-07-18 DIAGNOSIS — H40053 Ocular hypertension, bilateral: Secondary | ICD-10-CM | POA: Diagnosis not present

## 2022-07-18 DIAGNOSIS — J4520 Mild intermittent asthma, uncomplicated: Secondary | ICD-10-CM | POA: Diagnosis not present

## 2022-07-18 DIAGNOSIS — I10 Essential (primary) hypertension: Secondary | ICD-10-CM | POA: Diagnosis not present

## 2022-07-18 DIAGNOSIS — E782 Mixed hyperlipidemia: Secondary | ICD-10-CM | POA: Diagnosis not present

## 2022-07-18 DIAGNOSIS — I480 Paroxysmal atrial fibrillation: Secondary | ICD-10-CM | POA: Diagnosis not present

## 2022-07-18 DIAGNOSIS — K219 Gastro-esophageal reflux disease without esophagitis: Secondary | ICD-10-CM | POA: Diagnosis not present

## 2022-07-18 DIAGNOSIS — M6281 Muscle weakness (generalized): Secondary | ICD-10-CM | POA: Diagnosis not present

## 2022-07-18 DIAGNOSIS — I2510 Atherosclerotic heart disease of native coronary artery without angina pectoris: Secondary | ICD-10-CM | POA: Diagnosis not present

## 2022-07-19 DIAGNOSIS — H40053 Ocular hypertension, bilateral: Secondary | ICD-10-CM | POA: Diagnosis not present

## 2022-07-19 DIAGNOSIS — M6281 Muscle weakness (generalized): Secondary | ICD-10-CM | POA: Diagnosis not present

## 2022-07-19 DIAGNOSIS — I10 Essential (primary) hypertension: Secondary | ICD-10-CM | POA: Diagnosis not present

## 2022-07-19 DIAGNOSIS — I2510 Atherosclerotic heart disease of native coronary artery without angina pectoris: Secondary | ICD-10-CM | POA: Diagnosis not present

## 2022-07-19 DIAGNOSIS — K219 Gastro-esophageal reflux disease without esophagitis: Secondary | ICD-10-CM | POA: Diagnosis not present

## 2022-07-19 DIAGNOSIS — J4520 Mild intermittent asthma, uncomplicated: Secondary | ICD-10-CM | POA: Diagnosis not present

## 2022-07-19 DIAGNOSIS — F5101 Primary insomnia: Secondary | ICD-10-CM | POA: Diagnosis not present

## 2022-07-19 DIAGNOSIS — I4891 Unspecified atrial fibrillation: Secondary | ICD-10-CM | POA: Diagnosis not present

## 2022-07-19 DIAGNOSIS — E782 Mixed hyperlipidemia: Secondary | ICD-10-CM | POA: Diagnosis not present

## 2022-07-22 DIAGNOSIS — J4520 Mild intermittent asthma, uncomplicated: Secondary | ICD-10-CM | POA: Diagnosis not present

## 2022-07-22 DIAGNOSIS — H40053 Ocular hypertension, bilateral: Secondary | ICD-10-CM | POA: Diagnosis not present

## 2022-07-22 DIAGNOSIS — I10 Essential (primary) hypertension: Secondary | ICD-10-CM | POA: Diagnosis not present

## 2022-07-22 DIAGNOSIS — F5101 Primary insomnia: Secondary | ICD-10-CM | POA: Diagnosis not present

## 2022-07-22 DIAGNOSIS — E782 Mixed hyperlipidemia: Secondary | ICD-10-CM | POA: Diagnosis not present

## 2022-07-22 DIAGNOSIS — K219 Gastro-esophageal reflux disease without esophagitis: Secondary | ICD-10-CM | POA: Diagnosis not present

## 2022-07-22 DIAGNOSIS — I4891 Unspecified atrial fibrillation: Secondary | ICD-10-CM | POA: Diagnosis not present

## 2022-07-22 DIAGNOSIS — I2510 Atherosclerotic heart disease of native coronary artery without angina pectoris: Secondary | ICD-10-CM | POA: Diagnosis not present

## 2022-07-22 DIAGNOSIS — M6281 Muscle weakness (generalized): Secondary | ICD-10-CM | POA: Diagnosis not present

## 2022-07-25 ENCOUNTER — Ambulatory Visit: Payer: Medicare HMO | Admitting: Oncology

## 2022-07-26 DIAGNOSIS — I2510 Atherosclerotic heart disease of native coronary artery without angina pectoris: Secondary | ICD-10-CM | POA: Diagnosis not present

## 2022-07-26 DIAGNOSIS — I471 Supraventricular tachycardia: Secondary | ICD-10-CM | POA: Diagnosis not present

## 2022-07-26 DIAGNOSIS — I10 Essential (primary) hypertension: Secondary | ICD-10-CM | POA: Diagnosis not present

## 2022-07-26 DIAGNOSIS — I25118 Atherosclerotic heart disease of native coronary artery with other forms of angina pectoris: Secondary | ICD-10-CM | POA: Diagnosis not present

## 2022-07-26 DIAGNOSIS — R5381 Other malaise: Secondary | ICD-10-CM | POA: Diagnosis not present

## 2022-07-26 DIAGNOSIS — R5383 Other fatigue: Secondary | ICD-10-CM | POA: Diagnosis not present

## 2022-07-26 DIAGNOSIS — I700 Atherosclerosis of aorta: Secondary | ICD-10-CM | POA: Diagnosis not present

## 2022-07-26 DIAGNOSIS — R001 Bradycardia, unspecified: Secondary | ICD-10-CM | POA: Diagnosis not present

## 2022-07-26 DIAGNOSIS — R0609 Other forms of dyspnea: Secondary | ICD-10-CM | POA: Diagnosis not present

## 2022-07-28 DIAGNOSIS — I10 Essential (primary) hypertension: Secondary | ICD-10-CM | POA: Diagnosis not present

## 2022-07-28 DIAGNOSIS — Z23 Encounter for immunization: Secondary | ICD-10-CM | POA: Diagnosis not present

## 2022-07-28 DIAGNOSIS — I4891 Unspecified atrial fibrillation: Secondary | ICD-10-CM | POA: Diagnosis not present

## 2022-07-28 DIAGNOSIS — R4182 Altered mental status, unspecified: Secondary | ICD-10-CM | POA: Diagnosis not present

## 2022-07-28 DIAGNOSIS — I2510 Atherosclerotic heart disease of native coronary artery without angina pectoris: Secondary | ICD-10-CM | POA: Diagnosis not present

## 2022-07-28 DIAGNOSIS — E782 Mixed hyperlipidemia: Secondary | ICD-10-CM | POA: Diagnosis not present

## 2022-07-28 DIAGNOSIS — Z79899 Other long term (current) drug therapy: Secondary | ICD-10-CM | POA: Diagnosis not present

## 2022-08-01 DIAGNOSIS — H353211 Exudative age-related macular degeneration, right eye, with active choroidal neovascularization: Secondary | ICD-10-CM | POA: Diagnosis not present

## 2022-08-02 DIAGNOSIS — F32A Depression, unspecified: Secondary | ICD-10-CM | POA: Diagnosis not present

## 2022-08-02 DIAGNOSIS — I480 Paroxysmal atrial fibrillation: Secondary | ICD-10-CM | POA: Diagnosis not present

## 2022-08-02 DIAGNOSIS — I10 Essential (primary) hypertension: Secondary | ICD-10-CM | POA: Diagnosis not present

## 2022-08-02 DIAGNOSIS — I471 Supraventricular tachycardia, unspecified: Secondary | ICD-10-CM | POA: Diagnosis not present

## 2022-08-02 DIAGNOSIS — I2510 Atherosclerotic heart disease of native coronary artery without angina pectoris: Secondary | ICD-10-CM | POA: Diagnosis not present

## 2022-08-02 DIAGNOSIS — E782 Mixed hyperlipidemia: Secondary | ICD-10-CM | POA: Diagnosis not present

## 2022-08-02 DIAGNOSIS — J4520 Mild intermittent asthma, uncomplicated: Secondary | ICD-10-CM | POA: Diagnosis not present

## 2022-08-03 DIAGNOSIS — H401132 Primary open-angle glaucoma, bilateral, moderate stage: Secondary | ICD-10-CM | POA: Diagnosis not present

## 2022-08-08 ENCOUNTER — Other Ambulatory Visit: Payer: Self-pay

## 2022-08-08 DIAGNOSIS — D472 Monoclonal gammopathy: Secondary | ICD-10-CM

## 2022-08-10 ENCOUNTER — Inpatient Hospital Stay: Payer: Medicare HMO | Attending: Oncology

## 2022-08-10 DIAGNOSIS — D472 Monoclonal gammopathy: Secondary | ICD-10-CM | POA: Diagnosis not present

## 2022-08-10 LAB — CBC WITH DIFFERENTIAL/PLATELET
Abs Immature Granulocytes: 0.04 10*3/uL (ref 0.00–0.07)
Basophils Absolute: 0 10*3/uL (ref 0.0–0.1)
Basophils Relative: 0 %
Eosinophils Absolute: 0 10*3/uL (ref 0.0–0.5)
Eosinophils Relative: 0 %
HCT: 40.2 % (ref 36.0–46.0)
Hemoglobin: 13.3 g/dL (ref 12.0–15.0)
Immature Granulocytes: 0 %
Lymphocytes Relative: 10 %
Lymphs Abs: 1.1 10*3/uL (ref 0.7–4.0)
MCH: 31.7 pg (ref 26.0–34.0)
MCHC: 33.1 g/dL (ref 30.0–36.0)
MCV: 95.7 fL (ref 80.0–100.0)
Monocytes Absolute: 0.9 10*3/uL (ref 0.1–1.0)
Monocytes Relative: 8 %
Neutro Abs: 8.2 10*3/uL — ABNORMAL HIGH (ref 1.7–7.7)
Neutrophils Relative %: 82 %
Platelets: 294 10*3/uL (ref 150–400)
RBC: 4.2 MIL/uL (ref 3.87–5.11)
RDW: 12.9 % (ref 11.5–15.5)
WBC: 10.2 10*3/uL (ref 4.0–10.5)
nRBC: 0 % (ref 0.0–0.2)

## 2022-08-10 LAB — COMPREHENSIVE METABOLIC PANEL
ALT: 24 U/L (ref 0–44)
AST: 25 U/L (ref 15–41)
Albumin: 3.8 g/dL (ref 3.5–5.0)
Alkaline Phosphatase: 75 U/L (ref 38–126)
Anion gap: 5 (ref 5–15)
BUN: 16 mg/dL (ref 8–23)
CO2: 30 mmol/L (ref 22–32)
Calcium: 8.6 mg/dL — ABNORMAL LOW (ref 8.9–10.3)
Chloride: 102 mmol/L (ref 98–111)
Creatinine, Ser: 0.6 mg/dL (ref 0.44–1.00)
GFR, Estimated: >60 mL/min (ref >60)
Glucose, Bld: 71 mg/dL (ref 70–99)
Potassium: 4.3 mmol/L (ref 3.5–5.1)
Sodium: 137 mmol/L (ref 135–145)
Total Bilirubin: 0.4 mg/dL (ref 0.3–1.2)
Total Protein: 6.5 g/dL (ref 6.5–8.1)

## 2022-08-11 DIAGNOSIS — K30 Functional dyspepsia: Secondary | ICD-10-CM | POA: Diagnosis not present

## 2022-08-11 DIAGNOSIS — K2270 Barrett's esophagus without dysplasia: Secondary | ICD-10-CM | POA: Diagnosis not present

## 2022-08-12 LAB — KAPPA/LAMBDA LIGHT CHAINS
Kappa free light chain: 58.6 mg/L — ABNORMAL HIGH (ref 3.3–19.4)
Kappa, lambda light chain ratio: 5.8 — ABNORMAL HIGH (ref 0.26–1.65)
Lambda free light chains: 10.1 mg/L (ref 5.7–26.3)

## 2022-08-15 LAB — MULTIPLE MYELOMA PANEL, SERUM
Albumin SerPl Elph-Mcnc: 3.5 g/dL (ref 2.9–4.4)
Albumin/Glob SerPl: 1.5 (ref 0.7–1.7)
Alpha 1: 0.2 g/dL (ref 0.0–0.4)
Alpha2 Glob SerPl Elph-Mcnc: 0.8 g/dL (ref 0.4–1.0)
B-Globulin SerPl Elph-Mcnc: 0.7 g/dL (ref 0.7–1.3)
Gamma Glob SerPl Elph-Mcnc: 0.6 g/dL (ref 0.4–1.8)
Globulin, Total: 2.4 g/dL (ref 2.2–3.9)
IgA: 116 mg/dL (ref 64–422)
IgG (Immunoglobin G), Serum: 629 mg/dL (ref 586–1602)
IgM (Immunoglobulin M), Srm: 44 mg/dL (ref 26–217)
M Protein SerPl Elph-Mcnc: 0.2 g/dL — ABNORMAL HIGH
Total Protein ELP: 5.9 g/dL — ABNORMAL LOW (ref 6.0–8.5)

## 2022-08-17 ENCOUNTER — Encounter: Payer: Self-pay | Admitting: Oncology

## 2022-08-17 ENCOUNTER — Inpatient Hospital Stay (HOSPITAL_BASED_OUTPATIENT_CLINIC_OR_DEPARTMENT_OTHER): Payer: Medicare HMO | Admitting: Oncology

## 2022-08-17 VITALS — BP 106/53 | HR 59 | Temp 99.3°F | Resp 16 | Wt 105.0 lb

## 2022-08-17 DIAGNOSIS — D472 Monoclonal gammopathy: Secondary | ICD-10-CM | POA: Insufficient documentation

## 2022-08-17 NOTE — Progress Notes (Signed)
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  Light chain ratio is slightly increased. Patient has stable calcium level, normal kidney function.  Normal hemoglobin level.   I recommend continue observation and patient will follow-up with me in 1 year.  Orders Placed This Encounter  Procedures   CBC with Differential    Standing Status:   Future    Standing Expiration Date:   08/17/2023   Comprehensive metabolic panel    Standing Status:   Future    Standing Expiration Date:   08/17/2023   UPEP/TP, 24-Hr Urine    Standing Status:   Future    Standing Expiration Date:   08/18/2023   Multiple Myeloma Panel (SPEP&IFE w/QIG)    Standing Status:   Future    Standing Expiration Date:   08/17/2023   Follow-up in 1 year All questions were answered. The patient knows to call the clinic with any problems, questions or concerns.  Earlie Server, MD, PhD [facility] Hematology Oncology 08/17/2022    CHIEF COMPLAINTS/REASON FOR VISIT:  Follow up for MGUS  HISTORY OF PRESENTING ILLNESS:  Mary Thornton is a 81 y.o. female presents for MGUS  Patient recently had work up done at Neurologist's office for lower extremity neuropathy.. Labs reviewed,  03/18/2019 SPEP showed 0.2 M spike, repeat SPEP on 06/27/2019 showed M protein of 0.2. IFE showed IgG Kappa monoclonal protein.  No aggravating or alleviated factors.  Associated signs or symptoms:  Neuropathy: Cool sensation in her face since around June 2019.  Symptoms have been gradually worsening.  She is not diabetic.  Also numbness in her bilateral feet since around October 2019 after lumbar decompression surgery.  Symptoms are worse when she lays down. Denies weight loss, fever chills, night sweating    Bone pain: Chronic back pain.  INTERVAL HISTORY Mary Thornton is a 81 y.o. female who has above history reviewed by me today presents for follow up visit for management of MGUS Problems and complaints are listed below: Patient has no new complaints today.   Denies any new bone pain.  Chronic back pain unchanged. 07/08/2022 - 07/13/2022, patient was hospitalized due to atrial fibrillation with rapid ventricular response.  Patient is now on Eliquis for anticoagulation.  Denies any bleeding events.  Patient denies any new bone pain.  Review of Systems  Constitutional:  Negative for appetite change, chills, fatigue and fever.  HENT:   Negative for hearing loss and voice change.   Eyes:  Negative for eye problems.  Respiratory:  Negative for chest tightness and cough.   Cardiovascular:  Negative for chest pain.       A-fib  Gastrointestinal:  Negative for abdominal distention, abdominal pain and blood in stool.  Endocrine: Negative for hot flashes.  Genitourinary:  Negative for difficulty urinating and frequency.   Musculoskeletal:  Negative for arthralgias and back pain.  Skin:  Negative for itching and rash.  Neurological:  Positive for numbness. Negative for extremity weakness.  Hematological:  Negative for adenopathy.  Psychiatric/Behavioral:  Negative for confusion.      MEDICAL HISTORY:  Past Medical History:  Diagnosis Date   Arthritis    Asthma    Barrett esophagus    Complication of anesthesia    very hard to wake up. feels  like she had too much medicine. 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 Herbert Deaner   COLONOSCOPY WITH PROPOFOL N/A 06/23/2015   Procedure: COLONOSCOPY WITH PROPOFOL;  Surgeon: [name], MD;  Location: [facility] ENDOSCOPY;  Service: Endoscopy;  Laterality: N/A;   COLONOSCOPY WITH PROPOFOL N/A 08/11/2020   Procedure: COLONOSCOPY WITH PROPOFOL;  Surgeon: Lesly Rubenstein, MD;  Location: [facility] ENDOSCOPY;  Service: Endoscopy;  Laterality: N/A;   ESOPHAGOGASTRODUODENOSCOPY (EGD) WITH PROPOFOL N/A 06/23/2015   Procedure: ESOPHAGOGASTRODUODENOSCOPY (EGD) WITH PROPOFOL;  Surgeon: [name], MD;  Location: [facility] ENDOSCOPY;  Service: Endoscopy;  Laterality: N/A;   ESOPHAGOGASTRODUODENOSCOPY (EGD) WITH PROPOFOL N/A 11/28/2017   Procedure: ESOPHAGOGASTRODUODENOSCOPY (EGD) WITH PROPOFOL;  Surgeon: [name], MD;  Location: [facility] ENDOSCOPY;  Service: Endoscopy;  Laterality: N/A;   ESOPHAGOGASTRODUODENOSCOPY (EGD) WITH PROPOFOL N/A 01/18/2018   Procedure: ESOPHAGOGASTRODUODENOSCOPY (EGD) WITH PROPOFOL;  Surgeon: [name], MD;  Location: [facility] ENDOSCOPY;  Service: Endoscopy;  Laterality: N/A;   ESOPHAGOGASTRODUODENOSCOPY (EGD) WITH PROPOFOL N/A 08/11/2020   Procedure: ESOPHAGOGASTRODUODENOSCOPY (EGD) WITH PROPOFOL;  Surgeon: Lesly Rubenstein, MD;  Location: [facility] ENDOSCOPY;  Service: Endoscopy;  Laterality: N/A;   JOINT REPLACEMENT Left 2009   total knee replacement   KNEE ARTHROSCOPY     LUMBAR LAMINECTOMY/DECOMPRESSION MICRODISCECTOMY N/A 08/22/2018   Procedure: LUMBAR LAMINECTOMY/DECOMPRESSION MICRODISCECTOMY 1 LEVEL-L2-5;  Surgeon: Meade Maw, MD;  Location: [facility] ORS;  Service:  Neurosurgery;  Laterality: N/A;   Torticollis surgery x 2      SOCIAL HISTORY: Social History   Socioeconomic History   Marital status: Widowed    Spouse name: Not on file   Number of children: Not on file   Years of education: Not on file   Highest education level: Not on file  Occupational History   Occupation: [employer]    Comment: retired  Tobacco Use   Smoking status: Never   Smokeless tobacco: Never  Vaping Use   Vaping Use: Never used  Substance and Sexual Activity   Alcohol use: Never   Drug use: Never   Sexual activity: Not Currently    Birth control/protection: Surgical  Other Topics Concern   Not on file  Social History Narrative   Not on file   Social Determinants of Health   Financial Resource Strain: Not on file  Food Insecurity: No Food Insecurity (07/08/2022)   Hunger Vital Sign    Worried About Running Out of Food in the Last Year: Never true    Ran Out of Food in the Last Year: Never true  Transportation Needs: No Transportation Needs (07/08/2022)   [place] - Transportation  Lack of Transportation (Medical): No    Lack of Transportation (Non-Medical): No  Physical Activity: Not on file  Stress: Not on file  Social Connections: Not on file  Intimate Partner Violence: Not At Risk (07/08/2022)   Humiliation, Afraid, Rape, and Kick questionnaire    Fear of Current or Ex-Partner: No    Emotionally Abused: No    Physically Abused: No    Sexually Abused: No    FAMILY HISTORY: Family History  Problem Relation Age of Onset   Heart disease Mother    Heart disease Sister    Cancer Neg Hx    Diabetes Neg Hx    Breast cancer Neg Hx    Amblyopia Neg Hx    Blindness Neg Hx    Cataracts Neg Hx    Glaucoma Neg Hx    Macular degeneration Neg Hx    Retinal detachment Neg Hx    Strabismus Neg Hx    Retinitis pigmentosa Neg Hx     ALLERGIES:  is allergic to ibuprofen, chlorhexidine, cymbalta [duloxetine hcl], penicillins, and sulfa  antibiotics.  MEDICATIONS:  Current Outpatient Medications  Medication Sig Dispense Refill   acetaminophen (TYLENOL) 325 MG tablet Take 650 mg by mouth every 6 (six) hours as needed.     apixaban (ELIQUIS) 2.5 MG TABS tablet Take 1 tablet (2.5 mg total) by mouth 2 (two) times daily. 60 tablet 2   Azelastine HCl 137 MCG/SPRAY SOLN Place 1 spray into both nostrils 2 (two) times daily.     baclofen (LIORESAL) 20 MG tablet Take 20 mg by mouth 2 (two) times daily.     calcium-vitamin D (OSCAL WITH D) 500-200 MG-UNIT tablet Take 1 tablet by mouth daily.     diltiazem (CARDIZEM) 60 MG tablet Take by mouth.     feeding supplement (ENSURE ENLIVE / ENSURE PLUS) LIQD Take 237 mLs by mouth 2 (two) times daily between meals. 237 mL 12   loratadine (CLARITIN) 10 MG tablet Take 1 tablet (10 mg total) by mouth daily. 30 tablet 0   losartan (COZAAR) 25 MG tablet Take 2 tablets (50 mg total) by mouth 2 (two) times daily. 30 tablet 1   melatonin 5 MG TABS Take 1 tablet (5 mg total) by mouth at bedtime as needed. 10 tablet 0   metoprolol tartrate (LOPRESSOR) 25 MG tablet Take 0.5 tablets (12.5 mg total) by mouth 2 (two) times daily. 30 tablet 2   mirtazapine (REMERON) 15 MG tablet Take 15 mg by mouth at bedtime.     montelukast (SINGULAIR) 10 MG tablet Take 10 mg by mouth daily.      Multiple Vitamin (MULTIVITAMIN) tablet Take 1 tablet by mouth daily at 3 pm.      Multiple Vitamins-Minerals (PRESERVISION AREDS PO) Take 2 capsules by mouth 2 (two) times daily. Take one capsule in the morning and one in the evening     pantoprazole (PROTONIX) 40 MG tablet Take 40 mg by mouth daily before breakfast.      timolol (TIMOPTIC) 0.25 % ophthalmic solution Place 1 drop into both eyes 2 (two) times daily. _0 /1700     atorvastatin (LIPITOR) 40 MG tablet Take 1 tablet (40 mg total) by mouth daily. 30 tablet 0   No current facility-administered medications for this visit.     PHYSICAL EXAMINATION: ECOG PERFORMANCE  STATUS: 1 - Symptomatic but completely ambulatory Vitals:   08/17/22 1342  BP: (!) 106/53  Pulse: (!) 59  Resp: 16  Temp: 99.3 F (37.4 C)  SpO2: 97%   Filed Weights   08/17/22 1342  Weight: 105 lb (47.6 kg)    Physical Exam Constitutional:      General: She is not in acute distress. HENT:     Head: Normocephalic and atraumatic.  Eyes:     General: No scleral icterus. Cardiovascular:     Rate and Rhythm: Normal rate and regular rhythm.     Heart sounds: Normal heart sounds.  Pulmonary:     Effort: Pulmonary effort is normal. No respiratory distress.     Breath sounds: No wheezing.  Abdominal:     General: Bowel sounds are normal. There is no distension.     Palpations: Abdomen is soft.  Musculoskeletal:        General: No deformity. Normal range of motion.     Cervical back: Normal range of motion and neck supple.  Skin:    General: Skin is warm and dry.     Findings: No erythema or rash.  Neurological:     Mental Status: She is alert and oriented to person, place, and time. Mental status is at baseline.     Cranial Nerves: No cranial nerve deficit.  Psychiatric:        Mood and Affect: Mood normal.      LABORATORY DATA:  I have reviewed the data as listed Lab Results  Component Value Date   WBC 10.2 08/10/2022   HGB 13.3 08/10/2022   HCT 40.2 08/10/2022   MCV 95.7 08/10/2022   PLT 294 08/10/2022   Recent Labs    07/08/22 1637 07/09/22 0535 07/12/22 0641 07/13/22 0632 08/10/22 0912  NA 136   < > 137 138 137  K 4.6   < > 3.7 3.8 4.3  CL 103   < > 102 105 102  CO2 24   < > _0 GLUCOSE 120*   < > 105* 100* 71  BUN 14   < > _1 CREATININE 0.62   < > 0.49 0.46 0.60  CALCIUM 8.3*   < > 8.4* 8.4* 8.6*  GFRNONAA >60   < > >60 >60 >60  PROT 6.3*  --   --   --  6.5  ALBUMIN 4.2  --   --   --  3.8  AST 32  --   --   --  25  ALT 34  --   --   --  24  ALKPHOS 69  --   --   --  75  BILITOT 0.7  --   --   --  0.4   < > = values in this  interval not displayed.    Iron/TIBC/Ferritin/ %Sat No results found for: "IRON", "TIBC", "FERRITIN", "IRONPCTSAT"    RADIOGRAPHIC STUDIES: I have personally reviewed the radiological images as listed and agreed with the findings in the report.  ECHOCARDIOGRAM COMPLETE  Result Date: 07/11/2022    ECHOCARDIOGRAM REPORT   Patient Name:   Mary Thornton [facility] Date of Exam: 07/11/2022 Medical Rec #:  [id number]           Height:       63.0 in Accession #:    [id number]          Weight:       108.0 lb Date of Birth:  1941/05/19           BSA:          1.488 m Patient Age:    55  years            BP:           125/75 mmHg Patient Gender: F                   HR:           84 bpm. Exam Location:  [facility] Procedure: 2D Echo, Cardiac Doppler and Color Doppler Indications:     Endocarditis I38  History:         Patient has no prior history of Echocardiogram examinations.                  Risk Factors:Hypertension and Dyslipidemia.  Sonographer:     Sherrie Sport Referring Phys:  [place] Diagnosing Phys: Isaias Cowman MD IMPRESSIONS  1. Left ventricular ejection fraction, by estimation, is 65 to 70%. The left ventricle has normal function. The left ventricle has no regional wall motion abnormalities. Left ventricular diastolic parameters were normal.  2. Right ventricular systolic function is normal. The right ventricular size is normal.  3. The mitral valve is normal in structure. Mild mitral valve regurgitation. No evidence of mitral stenosis.  4. The aortic valve is normal in structure. Aortic valve regurgitation is not visualized. No aortic stenosis is present.  5. The inferior vena cava is normal in size with greater than 50% respiratory variability, suggesting right atrial pressure of 3 mmHg. FINDINGS  Left Ventricle: Left ventricular ejection fraction, by estimation, is 65 to 70%. The left ventricle has normal function. The left ventricle has no regional wall motion abnormalities. The left  ventricular internal cavity size was normal in size. There is  no left ventricular hypertrophy. Left ventricular diastolic parameters were normal. Right Ventricle: The right ventricular size is normal. No increase in right ventricular wall thickness. Right ventricular systolic function is normal. Left Atrium: Left atrial size was normal in size. Right Atrium: Right atrial size was normal in size. Pericardium: There is no evidence of pericardial effusion. Mitral Valve: The mitral valve is normal in structure. Mild mitral valve regurgitation. No evidence of mitral valve stenosis. Tricuspid Valve: The tricuspid valve is normal in structure. Tricuspid valve regurgitation is mild . No evidence of tricuspid stenosis. Aortic Valve: The aortic valve is normal in structure. Aortic valve regurgitation is not visualized. No aortic stenosis is present. Aortic valve mean gradient measures 7.0 mmHg. Aortic valve peak gradient measures 11.4 mmHg. Aortic valve area, by VTI measures 3.28 cm. Pulmonic Valve: The pulmonic valve was normal in structure. Pulmonic valve regurgitation is not visualized. No evidence of pulmonic stenosis. Aorta: The aortic root is normal in size and structure. Venous: The inferior vena cava is normal in size with greater than 50% respiratory variability, suggesting right atrial pressure of 3 mmHg. IAS/Shunts: No atrial level shunt detected by color flow Doppler.  LEFT VENTRICLE PLAX 2D LVIDd:         3.60 cm   Diastology LVIDs:         2.30 cm   LV e' medial:    3.92 cm/s LV PW:         1.40 cm   LV E/e' medial:  18.0 LV IVS:        1.40 cm   LV e' lateral:   2.61 cm/s LVOT diam:     2.00 cm   LV E/e' lateral: 27.1 LV SV:         115 LV SV Index:   77 LVOT  Area:     3.14 cm  RIGHT VENTRICLE RV S prime:     16.90 cm/s TAPSE (M-mode): 2.4 cm LEFT ATRIUM           Index        RIGHT ATRIUM           Index LA diam:      2.80 cm 1.88 cm/m   RA Area:     12.70 cm LA Vol (A2C): 37.5 ml 25.20 ml/m  RA Volume:    23.50 ml  15.79 ml/m LA Vol (A4C): 38.5 ml 25.87 ml/m  AORTIC VALVE AV Area (Vmax):    3.27 cm AV Area (Vmean):   3.31 cm AV Area (VTI):     3.28 cm AV Vmax:           169.00 cm/s AV Vmean:          127.000 cm/s AV VTI:            0.350 m AV Peak Grad:      11.4 mmHg AV Mean Grad:      7.0 mmHg LVOT Vmax:         176.00 cm/s LVOT Vmean:        134.000 cm/s LVOT VTI:          0.365 m LVOT/AV VTI ratio: 1.04  AORTA Ao Root diam: 2.80 cm MITRAL VALVE               TRICUSPID VALVE MV Area (PHT): 2.52 cm    TR Peak grad:   25.8 mmHg MV Decel Time: 301 msec    TR Vmax:        254.00 cm/s MV E velocity: 70.70 cm/s MV A velocity: 89.10 cm/s  SHUNTS MV E/A ratio:  0.79        Systemic VTI:  0.36 m                            Systemic Diam: 2.00 cm Isaias Cowman MD Electronically signed by Isaias Cowman MD Signature Date/Time: 07/11/2022/5:00:50 PM    Final    MR BRAIN WO CONTRAST  Result Date: 07/10/2022 CLINICAL DATA:  [place]irium EXAM: MRI HEAD WITHOUT CONTRAST TECHNIQUE: Multiplanar, multiecho pulse sequences of the brain and surrounding structures were obtained without intravenous contrast. COMPARISON:  Head CT from 2 days ago FINDINGS: Brain: No acute infarction, hemorrhage, hydrocephalus, extra-axial collection or mass lesion. Chronic small vessel ischemia in the cerebral white matter, mild for age. Mild cerebral volume loss for age. Vascular: Normal flow voids. Skull and upper cervical spine: Normal marrow signal. Sinuses/Orbits: Right cataract resection. Other: Intermittent motion artifact, likely best obtainable. IMPRESSION: 1. Aging brain without acute or reversible finding. 2. Motion degraded. Electronically Signed   By: Jorje Guild M.D.   On: 07/10/2022 12:52   CT Head Wo Contrast  Result Date: 07/08/2022 CLINICAL DATA:  Altered mental status EXAM: CT HEAD WITHOUT CONTRAST TECHNIQUE: Contiguous axial images were obtained from the base of the skull through the vertex without intravenous  contrast. RADIATION DOSE REDUCTION: This exam was performed according to the departmental dose-optimization program which includes automated exposure control, adjustment of the mA and/or kV according to patient size and/or use of iterative reconstruction technique. COMPARISON:  04/29/2022 FINDINGS: Brain: No acute intracranial findings are seen in noncontrast CT brain. There are no signs of bleeding within the cranium. Cortical sulci are prominent. Small old lacunar infarcts are seen in basal  ganglia. There is decreased density in periventricular white matter. Vascular: Unremarkable. Skull: Unremarkable. Sinuses/Orbits: No acute findings are seen. Other: None. IMPRESSION: No acute intracranial findings are seen a noncontrast CT brain. Atrophy. 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 Electronically Signed   By: Elmer Picker M.D.   On: 07/08/2022 18:25   DG Chest Port 1 View  Result Date: 07/08/2022 CLINICAL DATA:  Altered mental status EXAM: PORTABLE CHEST 1 VIEW COMPARISON:  10/30/2017 FINDINGS: No acute airspace disease or effusion. Borderline cardiac enlargement. Aortic atherosclerosis. No pneumothorax. IMPRESSION: No active disease.  Borderline cardiomegaly Electronically Signed   By: Donavan Foil M.D.   On: 07/08/2022 17:11

## 2022-08-17 NOTE — Assessment & Plan Note (Signed)
IgG MGUS M protein remained stable at 0.2.  Light chain ratio is slightly increased. Patient has stable calcium level, normal kidney function.  Normal hemoglobin level.   I recommend continue observation and patient will follow-up with me in 1 year.

## 2022-08-17 NOTE — Progress Notes (Signed)
Pt and son in for follow up, pt was recently in hospital and then rehab  at [facility] and returned home on Sept 22, 2023.

## 2022-08-31 DIAGNOSIS — E782 Mixed hyperlipidemia: Secondary | ICD-10-CM | POA: Diagnosis not present

## 2022-08-31 DIAGNOSIS — I2510 Atherosclerotic heart disease of native coronary artery without angina pectoris: Secondary | ICD-10-CM | POA: Diagnosis not present

## 2022-08-31 DIAGNOSIS — I10 Essential (primary) hypertension: Secondary | ICD-10-CM | POA: Diagnosis not present

## 2022-08-31 DIAGNOSIS — K2270 Barrett's esophagus without dysplasia: Secondary | ICD-10-CM | POA: Diagnosis not present

## 2022-08-31 DIAGNOSIS — I471 Supraventricular tachycardia, unspecified: Secondary | ICD-10-CM | POA: Diagnosis not present

## 2022-09-09 ENCOUNTER — Other Ambulatory Visit: Payer: Self-pay | Admitting: Internal Medicine

## 2022-09-09 DIAGNOSIS — Z1231 Encounter for screening mammogram for malignant neoplasm of breast: Secondary | ICD-10-CM

## 2022-09-13 DIAGNOSIS — H353211 Exudative age-related macular degeneration, right eye, with active choroidal neovascularization: Secondary | ICD-10-CM | POA: Diagnosis not present

## 2022-09-13 DIAGNOSIS — H353221 Exudative age-related macular degeneration, left eye, with active choroidal neovascularization: Secondary | ICD-10-CM | POA: Diagnosis not present

## 2022-09-19 DIAGNOSIS — M25561 Pain in right knee: Secondary | ICD-10-CM | POA: Diagnosis not present

## 2022-09-26 DIAGNOSIS — H353211 Exudative age-related macular degeneration, right eye, with active choroidal neovascularization: Secondary | ICD-10-CM | POA: Diagnosis not present

## 2022-10-06 DIAGNOSIS — G8929 Other chronic pain: Secondary | ICD-10-CM | POA: Diagnosis not present

## 2022-10-06 DIAGNOSIS — R410 Disorientation, unspecified: Secondary | ICD-10-CM | POA: Diagnosis not present

## 2022-10-06 DIAGNOSIS — G243 Spasmodic torticollis: Secondary | ICD-10-CM | POA: Diagnosis not present

## 2022-10-06 DIAGNOSIS — M545 Low back pain, unspecified: Secondary | ICD-10-CM | POA: Diagnosis not present

## 2022-10-06 DIAGNOSIS — Z8679 Personal history of other diseases of the circulatory system: Secondary | ICD-10-CM | POA: Diagnosis not present

## 2022-10-06 DIAGNOSIS — M25561 Pain in right knee: Secondary | ICD-10-CM | POA: Diagnosis not present

## 2022-10-06 DIAGNOSIS — T50905A Adverse effect of unspecified drugs, medicaments and biological substances, initial encounter: Secondary | ICD-10-CM | POA: Diagnosis not present

## 2022-10-06 DIAGNOSIS — G479 Sleep disorder, unspecified: Secondary | ICD-10-CM | POA: Diagnosis not present

## 2022-10-12 ENCOUNTER — Ambulatory Visit
Admission: RE | Admit: 2022-10-12 | Discharge: 2022-10-12 | Disposition: A | Discharge status: Home / Self Care | Payer: Medicare HMO | Source: Ambulatory Visit | Attending: Internal Medicine | Admitting: Internal Medicine

## 2022-10-12 DIAGNOSIS — Z1231 Encounter for screening mammogram for malignant neoplasm of breast: Secondary | ICD-10-CM | POA: Diagnosis not present

## 2022-10-19 DIAGNOSIS — M67432 Ganglion, left wrist: Secondary | ICD-10-CM | POA: Diagnosis not present

## 2022-10-19 DIAGNOSIS — S63502S Unspecified sprain of left wrist, sequela: Secondary | ICD-10-CM | POA: Diagnosis not present

## 2022-11-01 DIAGNOSIS — Z79899 Other long term (current) drug therapy: Secondary | ICD-10-CM | POA: Diagnosis not present

## 2022-11-01 DIAGNOSIS — Z0000 Encounter for general adult medical examination without abnormal findings: Secondary | ICD-10-CM | POA: Diagnosis not present

## 2022-11-01 DIAGNOSIS — I2510 Atherosclerotic heart disease of native coronary artery without angina pectoris: Secondary | ICD-10-CM | POA: Diagnosis not present

## 2022-11-01 DIAGNOSIS — I10 Essential (primary) hypertension: Secondary | ICD-10-CM | POA: Diagnosis not present

## 2022-11-01 DIAGNOSIS — E785 Hyperlipidemia, unspecified: Secondary | ICD-10-CM | POA: Diagnosis not present

## 2022-11-01 DIAGNOSIS — K2270 Barrett's esophagus without dysplasia: Secondary | ICD-10-CM | POA: Diagnosis not present

## 2022-11-08 ENCOUNTER — Ambulatory Visit (LOCAL_COMMUNITY_HEALTH_CENTER): Payer: Medicare HMO

## 2022-11-08 DIAGNOSIS — Z719 Counseling, unspecified: Secondary | ICD-10-CM

## 2022-11-08 DIAGNOSIS — Z23 Encounter for immunization: Secondary | ICD-10-CM | POA: Diagnosis not present

## 2022-11-08 NOTE — Progress Notes (Signed)
  Are you feeling sick today? No   Have you ever received a dose of COVID-19 Vaccine? [employer], [place], [place], [place], Other) Yes  If yes, which vaccine and how many doses?    5  Did you bring the vaccination record card or other documentation?  Yes   Do you have a health condition or are undergoing treatment that makes you moderately or severely immunocompromised? This would include, but not be limited to: cancer, HIV, organ transplant, immunosuppressive therapy/high-dose corticosteroids, or moderate/severe primary immunodeficiency.  No  Have you received COVID-19 vaccine before or during hematopoietic cell transplant (HCT) or CAR-T-cell therapies? No  Have you ever had an allergic reaction to: (This would include a severe allergic reaction or a reaction that caused hives, swelling, or respiratory distress, including wheezing.) A component of a COVID-19 vaccine or a previous dose of COVID-19 vaccine? No   Have you ever had an allergic reaction to another vaccine (other thanCOVID-19 vaccine) or an injectable medication? (This would include a severe allergic reaction or a reaction that caused hives, swelling, or respiratory distress, including wheezing.)   No    Do you have a history of any of the following:  Myocarditis or Pericarditis No  Dermal fillers:  No  Multisystem Inflammatory Syndrome (MIS-C or MIS-A)? No  COVID-19 disease within the past 3 months? No  Vaccinated with monkeypox vaccine in the last 4 weeks? No    In nurse clinic today for [place] Covid vaccine. Tolerated well L. Delt. VIS given.NCIR updated and copy given to pt.

## 2022-11-21 DIAGNOSIS — H353211 Exudative age-related macular degeneration, right eye, with active choroidal neovascularization: Secondary | ICD-10-CM | POA: Diagnosis not present

## 2022-11-24 DIAGNOSIS — I700 Atherosclerosis of aorta: Secondary | ICD-10-CM | POA: Diagnosis not present

## 2022-11-24 DIAGNOSIS — S63502D Unspecified sprain of left wrist, subsequent encounter: Secondary | ICD-10-CM | POA: Diagnosis not present

## 2022-11-24 DIAGNOSIS — M1711 Unilateral primary osteoarthritis, right knee: Secondary | ICD-10-CM | POA: Diagnosis not present

## 2022-11-24 DIAGNOSIS — M67432 Ganglion, left wrist: Secondary | ICD-10-CM | POA: Diagnosis not present

## 2022-12-15 DIAGNOSIS — M1711 Unilateral primary osteoarthritis, right knee: Secondary | ICD-10-CM | POA: Diagnosis not present

## 2022-12-19 DIAGNOSIS — I2510 Atherosclerotic heart disease of native coronary artery without angina pectoris: Secondary | ICD-10-CM | POA: Diagnosis not present

## 2022-12-19 DIAGNOSIS — R001 Bradycardia, unspecified: Secondary | ICD-10-CM | POA: Diagnosis not present

## 2022-12-19 DIAGNOSIS — E782 Mixed hyperlipidemia: Secondary | ICD-10-CM | POA: Diagnosis not present

## 2022-12-19 DIAGNOSIS — I25118 Atherosclerotic heart disease of native coronary artery with other forms of angina pectoris: Secondary | ICD-10-CM | POA: Diagnosis not present

## 2022-12-19 DIAGNOSIS — I4891 Unspecified atrial fibrillation: Secondary | ICD-10-CM | POA: Diagnosis not present

## 2022-12-19 DIAGNOSIS — I10 Essential (primary) hypertension: Secondary | ICD-10-CM | POA: Diagnosis not present

## 2022-12-19 DIAGNOSIS — I700 Atherosclerosis of aorta: Secondary | ICD-10-CM | POA: Diagnosis not present

## 2022-12-19 DIAGNOSIS — I471 Supraventricular tachycardia, unspecified: Secondary | ICD-10-CM | POA: Diagnosis not present

## 2022-12-20 DIAGNOSIS — H353221 Exudative age-related macular degeneration, left eye, with active choroidal neovascularization: Secondary | ICD-10-CM | POA: Diagnosis not present

## 2022-12-20 DIAGNOSIS — H353211 Exudative age-related macular degeneration, right eye, with active choroidal neovascularization: Secondary | ICD-10-CM | POA: Diagnosis not present

## 2022-12-26 DIAGNOSIS — H353211 Exudative age-related macular degeneration, right eye, with active choroidal neovascularization: Secondary | ICD-10-CM | POA: Diagnosis not present

## 2022-12-26 DIAGNOSIS — H353221 Exudative age-related macular degeneration, left eye, with active choroidal neovascularization: Secondary | ICD-10-CM | POA: Diagnosis not present

## 2023-01-16 DIAGNOSIS — H401132 Primary open-angle glaucoma, bilateral, moderate stage: Secondary | ICD-10-CM | POA: Diagnosis not present

## 2023-01-24 DIAGNOSIS — H353221 Exudative age-related macular degeneration, left eye, with active choroidal neovascularization: Secondary | ICD-10-CM | POA: Diagnosis not present

## 2023-01-24 DIAGNOSIS — H353211 Exudative age-related macular degeneration, right eye, with active choroidal neovascularization: Secondary | ICD-10-CM | POA: Diagnosis not present

## 2023-01-26 DIAGNOSIS — H401132 Primary open-angle glaucoma, bilateral, moderate stage: Secondary | ICD-10-CM | POA: Diagnosis not present

## 2023-01-26 DIAGNOSIS — H2512 Age-related nuclear cataract, left eye: Secondary | ICD-10-CM | POA: Diagnosis not present

## 2023-01-26 DIAGNOSIS — H353211 Exudative age-related macular degeneration, right eye, with active choroidal neovascularization: Secondary | ICD-10-CM | POA: Diagnosis not present

## 2023-01-26 DIAGNOSIS — H353231 Exudative age-related macular degeneration, bilateral, with active choroidal neovascularization: Secondary | ICD-10-CM | POA: Diagnosis not present

## 2023-01-26 DIAGNOSIS — H353221 Exudative age-related macular degeneration, left eye, with active choroidal neovascularization: Secondary | ICD-10-CM | POA: Diagnosis not present

## 2023-01-30 DIAGNOSIS — R399 Unspecified symptoms and signs involving the genitourinary system: Secondary | ICD-10-CM | POA: Diagnosis not present

## 2023-01-30 DIAGNOSIS — R300 Dysuria: Secondary | ICD-10-CM | POA: Diagnosis not present

## 2023-02-10 DIAGNOSIS — Z0000 Encounter for general adult medical examination without abnormal findings: Secondary | ICD-10-CM | POA: Diagnosis not present

## 2023-02-10 DIAGNOSIS — E782 Mixed hyperlipidemia: Secondary | ICD-10-CM | POA: Diagnosis not present

## 2023-02-10 DIAGNOSIS — I2510 Atherosclerotic heart disease of native coronary artery without angina pectoris: Secondary | ICD-10-CM | POA: Diagnosis not present

## 2023-02-10 DIAGNOSIS — K219 Gastro-esophageal reflux disease without esophagitis: Secondary | ICD-10-CM | POA: Diagnosis not present

## 2023-02-10 DIAGNOSIS — Z79899 Other long term (current) drug therapy: Secondary | ICD-10-CM | POA: Diagnosis not present

## 2023-02-10 DIAGNOSIS — I480 Paroxysmal atrial fibrillation: Secondary | ICD-10-CM | POA: Diagnosis not present

## 2023-02-10 DIAGNOSIS — I10 Essential (primary) hypertension: Secondary | ICD-10-CM | POA: Diagnosis not present

## 2023-02-16 DIAGNOSIS — R410 Disorientation, unspecified: Secondary | ICD-10-CM | POA: Diagnosis not present

## 2023-03-04 DIAGNOSIS — R296 Repeated falls: Secondary | ICD-10-CM | POA: Diagnosis not present

## 2023-03-04 DIAGNOSIS — I6523 Occlusion and stenosis of bilateral carotid arteries: Secondary | ICD-10-CM | POA: Diagnosis not present

## 2023-03-04 DIAGNOSIS — R001 Bradycardia, unspecified: Secondary | ICD-10-CM | POA: Diagnosis not present

## 2023-03-04 DIAGNOSIS — W19XXXA Unspecified fall, initial encounter: Secondary | ICD-10-CM | POA: Diagnosis not present

## 2023-03-04 DIAGNOSIS — R0902 Hypoxemia: Secondary | ICD-10-CM | POA: Diagnosis not present

## 2023-03-04 DIAGNOSIS — D472 Monoclonal gammopathy: Secondary | ICD-10-CM | POA: Diagnosis not present

## 2023-03-04 DIAGNOSIS — Z9981 Dependence on supplemental oxygen: Secondary | ICD-10-CM | POA: Diagnosis not present

## 2023-03-04 DIAGNOSIS — R410 Disorientation, unspecified: Secondary | ICD-10-CM | POA: Diagnosis not present

## 2023-03-04 DIAGNOSIS — S5002XA Contusion of left elbow, initial encounter: Secondary | ICD-10-CM | POA: Diagnosis not present

## 2023-03-04 DIAGNOSIS — D72829 Elevated white blood cell count, unspecified: Secondary | ICD-10-CM | POA: Diagnosis not present

## 2023-03-04 DIAGNOSIS — Z20822 Contact with and (suspected) exposure to covid-19: Secondary | ICD-10-CM | POA: Diagnosis not present

## 2023-03-04 DIAGNOSIS — M19022 Primary osteoarthritis, left elbow: Secondary | ICD-10-CM | POA: Diagnosis not present

## 2023-03-04 DIAGNOSIS — E119 Type 2 diabetes mellitus without complications: Secondary | ICD-10-CM | POA: Diagnosis not present

## 2023-03-04 DIAGNOSIS — R079 Chest pain, unspecified: Secondary | ICD-10-CM | POA: Diagnosis not present

## 2023-03-04 DIAGNOSIS — D62 Acute posthemorrhagic anemia: Secondary | ICD-10-CM | POA: Diagnosis not present

## 2023-03-04 DIAGNOSIS — S7002XA Contusion of left hip, initial encounter: Secondary | ICD-10-CM | POA: Diagnosis not present

## 2023-03-04 DIAGNOSIS — D509 Iron deficiency anemia, unspecified: Secondary | ICD-10-CM | POA: Diagnosis not present

## 2023-03-04 DIAGNOSIS — Z96652 Presence of left artificial knee joint: Secondary | ICD-10-CM | POA: Diagnosis not present

## 2023-03-04 DIAGNOSIS — Z471 Aftercare following joint replacement surgery: Secondary | ICD-10-CM | POA: Diagnosis not present

## 2023-03-04 DIAGNOSIS — M6282 Rhabdomyolysis: Secondary | ICD-10-CM | POA: Diagnosis not present

## 2023-03-04 DIAGNOSIS — S7012XA Contusion of left thigh, initial encounter: Secondary | ICD-10-CM | POA: Diagnosis not present

## 2023-03-04 DIAGNOSIS — E860 Dehydration: Secondary | ICD-10-CM | POA: Diagnosis not present

## 2023-03-04 DIAGNOSIS — R569 Unspecified convulsions: Secondary | ICD-10-CM | POA: Diagnosis not present

## 2023-03-04 DIAGNOSIS — G9340 Encephalopathy, unspecified: Secondary | ICD-10-CM | POA: Diagnosis not present

## 2023-03-04 DIAGNOSIS — S59902A Unspecified injury of left elbow, initial encounter: Secondary | ICD-10-CM | POA: Diagnosis not present

## 2023-03-04 DIAGNOSIS — R0789 Other chest pain: Secondary | ICD-10-CM | POA: Diagnosis not present

## 2023-03-04 DIAGNOSIS — R4182 Altered mental status, unspecified: Secondary | ICD-10-CM | POA: Diagnosis not present

## 2023-03-04 DIAGNOSIS — I4891 Unspecified atrial fibrillation: Secondary | ICD-10-CM | POA: Diagnosis not present

## 2023-03-05 DIAGNOSIS — S5002XA Contusion of left elbow, initial encounter: Secondary | ICD-10-CM | POA: Diagnosis not present

## 2023-03-05 DIAGNOSIS — G9340 Encephalopathy, unspecified: Secondary | ICD-10-CM | POA: Diagnosis not present

## 2023-03-05 DIAGNOSIS — S7002XA Contusion of left hip, initial encounter: Secondary | ICD-10-CM | POA: Diagnosis not present

## 2023-03-05 DIAGNOSIS — D72829 Elevated white blood cell count, unspecified: Secondary | ICD-10-CM | POA: Diagnosis not present

## 2023-03-05 DIAGNOSIS — W19XXXA Unspecified fall, initial encounter: Secondary | ICD-10-CM | POA: Diagnosis not present

## 2023-03-05 DIAGNOSIS — S7012XA Contusion of left thigh, initial encounter: Secondary | ICD-10-CM | POA: Diagnosis not present

## 2023-03-06 DIAGNOSIS — G9340 Encephalopathy, unspecified: Secondary | ICD-10-CM | POA: Diagnosis not present

## 2023-03-06 DIAGNOSIS — I6523 Occlusion and stenosis of bilateral carotid arteries: Secondary | ICD-10-CM | POA: Diagnosis not present

## 2023-03-07 DIAGNOSIS — I10 Essential (primary) hypertension: Secondary | ICD-10-CM | POA: Diagnosis not present

## 2023-03-07 DIAGNOSIS — S5002XD Contusion of left elbow, subsequent encounter: Secondary | ICD-10-CM | POA: Diagnosis not present

## 2023-03-07 DIAGNOSIS — S79922D Unspecified injury of left thigh, subsequent encounter: Secondary | ICD-10-CM | POA: Diagnosis not present

## 2023-03-07 DIAGNOSIS — G9340 Encephalopathy, unspecified: Secondary | ICD-10-CM | POA: Diagnosis not present

## 2023-03-07 DIAGNOSIS — E119 Type 2 diabetes mellitus without complications: Secondary | ICD-10-CM | POA: Diagnosis not present

## 2023-03-07 DIAGNOSIS — I4891 Unspecified atrial fibrillation: Secondary | ICD-10-CM | POA: Diagnosis not present

## 2023-03-07 DIAGNOSIS — S7002XD Contusion of left hip, subsequent encounter: Secondary | ICD-10-CM | POA: Diagnosis not present

## 2023-03-07 DIAGNOSIS — J45909 Unspecified asthma, uncomplicated: Secondary | ICD-10-CM | POA: Diagnosis not present

## 2023-03-07 DIAGNOSIS — I2510 Atherosclerotic heart disease of native coronary artery without angina pectoris: Secondary | ICD-10-CM | POA: Diagnosis not present

## 2023-03-08 ENCOUNTER — Other Ambulatory Visit: Payer: Self-pay

## 2023-03-08 ENCOUNTER — Ambulatory Visit
Admission: RE | Admit: 2023-03-08 | Discharge: 2023-03-08 | Disposition: A | Discharge status: Home / Self Care | Payer: Medicare HMO | Source: Ambulatory Visit | Attending: Family Medicine | Admitting: Family Medicine

## 2023-03-08 ENCOUNTER — Inpatient Hospital Stay
Admission: EM | Admit: 2023-03-08 | Discharge: 2023-03-10 | DRG: 812 | Disposition: A | Discharge status: Home / Self Care | Payer: Medicare HMO | Attending: Internal Medicine | Admitting: Internal Medicine

## 2023-03-08 ENCOUNTER — Encounter: Payer: Self-pay | Admitting: Emergency Medicine

## 2023-03-08 DIAGNOSIS — Z888 Allergy status to other drugs, medicaments and biological substances status: Secondary | ICD-10-CM | POA: Diagnosis not present

## 2023-03-08 DIAGNOSIS — D500 Iron deficiency anemia secondary to blood loss (chronic): Secondary | ICD-10-CM | POA: Diagnosis not present

## 2023-03-08 DIAGNOSIS — R600 Localized edema: Secondary | ICD-10-CM | POA: Insufficient documentation

## 2023-03-08 DIAGNOSIS — T148XXA Other injury of unspecified body region, initial encounter: Secondary | ICD-10-CM | POA: Diagnosis not present

## 2023-03-08 DIAGNOSIS — Y929 Unspecified place or not applicable: Secondary | ICD-10-CM

## 2023-03-08 DIAGNOSIS — Z515 Encounter for palliative care: Secondary | ICD-10-CM | POA: Diagnosis not present

## 2023-03-08 DIAGNOSIS — I2510 Atherosclerotic heart disease of native coronary artery without angina pectoris: Secondary | ICD-10-CM | POA: Diagnosis not present

## 2023-03-08 DIAGNOSIS — Z7901 Long term (current) use of anticoagulants: Secondary | ICD-10-CM

## 2023-03-08 DIAGNOSIS — D508 Other iron deficiency anemias: Secondary | ICD-10-CM | POA: Diagnosis not present

## 2023-03-08 DIAGNOSIS — R42 Dizziness and giddiness: Secondary | ICD-10-CM | POA: Diagnosis not present

## 2023-03-08 DIAGNOSIS — I25118 Atherosclerotic heart disease of native coronary artery with other forms of angina pectoris: Secondary | ICD-10-CM | POA: Diagnosis not present

## 2023-03-08 DIAGNOSIS — Z79899 Other long term (current) drug therapy: Secondary | ICD-10-CM | POA: Diagnosis not present

## 2023-03-08 DIAGNOSIS — Z880 Allergy status to penicillin: Secondary | ICD-10-CM | POA: Diagnosis not present

## 2023-03-08 DIAGNOSIS — I4891 Unspecified atrial fibrillation: Secondary | ICD-10-CM | POA: Diagnosis present

## 2023-03-08 DIAGNOSIS — S7002XA Contusion of left hip, initial encounter: Secondary | ICD-10-CM | POA: Diagnosis present

## 2023-03-08 DIAGNOSIS — D649 Anemia, unspecified: Secondary | ICD-10-CM | POA: Diagnosis not present

## 2023-03-08 DIAGNOSIS — I482 Chronic atrial fibrillation, unspecified: Secondary | ICD-10-CM | POA: Diagnosis not present

## 2023-03-08 DIAGNOSIS — Z882 Allergy status to sulfonamides status: Secondary | ICD-10-CM

## 2023-03-08 DIAGNOSIS — R296 Repeated falls: Secondary | ICD-10-CM | POA: Diagnosis present

## 2023-03-08 DIAGNOSIS — M7989 Other specified soft tissue disorders: Secondary | ICD-10-CM | POA: Diagnosis not present

## 2023-03-08 DIAGNOSIS — D472 Monoclonal gammopathy: Secondary | ICD-10-CM

## 2023-03-08 DIAGNOSIS — K219 Gastro-esophageal reflux disease without esophagitis: Secondary | ICD-10-CM | POA: Diagnosis present

## 2023-03-08 DIAGNOSIS — Z09 Encounter for follow-up examination after completed treatment for conditions other than malignant neoplasm: Secondary | ICD-10-CM | POA: Diagnosis not present

## 2023-03-08 DIAGNOSIS — W19XXXA Unspecified fall, initial encounter: Secondary | ICD-10-CM | POA: Diagnosis present

## 2023-03-08 DIAGNOSIS — E785 Hyperlipidemia, unspecified: Secondary | ICD-10-CM | POA: Diagnosis not present

## 2023-03-08 DIAGNOSIS — I10 Essential (primary) hypertension: Secondary | ICD-10-CM | POA: Diagnosis not present

## 2023-03-08 DIAGNOSIS — Z96652 Presence of left artificial knee joint: Secondary | ICD-10-CM | POA: Diagnosis present

## 2023-03-08 DIAGNOSIS — Z9841 Cataract extraction status, right eye: Secondary | ICD-10-CM | POA: Diagnosis not present

## 2023-03-08 LAB — CBC WITH DIFFERENTIAL/PLATELET
Abs Immature Granulocytes: 0.11 10*3/uL — ABNORMAL HIGH (ref 0.00–0.07)
Basophils Absolute: 0 10*3/uL (ref 0.0–0.1)
Basophils Relative: 0 %
Eosinophils Absolute: 0.2 10*3/uL (ref 0.0–0.5)
Eosinophils Relative: 2 %
HCT: 21.1 % — ABNORMAL LOW (ref 36.0–46.0)
Hemoglobin: 6.8 g/dL — ABNORMAL LOW (ref 12.0–15.0)
Immature Granulocytes: 1 %
Lymphocytes Relative: 18 %
Lymphs Abs: 1.7 10*3/uL (ref 0.7–4.0)
MCH: 31.6 pg (ref 26.0–34.0)
MCHC: 32.2 g/dL (ref 30.0–36.0)
MCV: 98.1 fL (ref 80.0–100.0)
Monocytes Absolute: 1.3 10*3/uL — ABNORMAL HIGH (ref 0.1–1.0)
Monocytes Relative: 13 %
Neutro Abs: 6.3 10*3/uL (ref 1.7–7.7)
Neutrophils Relative %: 66 %
Platelets: 267 10*3/uL (ref 150–400)
RBC: 2.15 MIL/uL — ABNORMAL LOW (ref 3.87–5.11)
RDW: 13.5 % (ref 11.5–15.5)
WBC: 9.6 10*3/uL (ref 4.0–10.5)
nRBC: 0.3 % — ABNORMAL HIGH (ref 0.0–0.2)

## 2023-03-08 LAB — BASIC METABOLIC PANEL
Anion gap: 9 (ref 5–15)
BUN: 15 mg/dL (ref 8–23)
CO2: 25 mmol/L (ref 22–32)
Calcium: 8.5 mg/dL — ABNORMAL LOW (ref 8.9–10.3)
Chloride: 104 mmol/L (ref 98–111)
Creatinine, Ser: 0.56 mg/dL (ref 0.44–1.00)
GFR, Estimated: >60 mL/min (ref >60)
Glucose, Bld: 99 mg/dL (ref 70–99)
Potassium: 3.8 mmol/L (ref 3.5–5.1)
Sodium: 138 mmol/L (ref 135–145)

## 2023-03-08 LAB — TYPE AND SCREEN

## 2023-03-08 LAB — BPAM RBC
Blood Product Expiration Date: 202405092359
Unit Type and Rh: 5100

## 2023-03-08 LAB — PREPARE RBC (CROSSMATCH)

## 2023-03-08 LAB — TSH: TSH: 3.618 u[IU]/mL (ref 0.350–4.500)

## 2023-03-08 LAB — T4, FREE: Free T4: 1.18 ng/dL — ABNORMAL HIGH (ref 0.61–1.12)

## 2023-03-08 MED ORDER — ACETAMINOPHEN 650 MG RE SUPP: 650.0000 mg | Freq: Four times a day (QID) | RECTAL | Status: DC | PRN

## 2023-03-08 MED ORDER — DILTIAZEM HCL-DEXTROSE 125-5 MG/125ML-% IV SOLN (PREMIX)
5.0000 mg/h | INTRAVENOUS | Status: DC
  Administered 2023-03-08: 5 mg/h via INTRAVENOUS
  Administered 2023-03-09 (×2): 15 mg/h via INTRAVENOUS
  Filled 2023-03-08 (×3): qty 125

## 2023-03-08 MED ORDER — CLORAZEPATE DIPOTASSIUM 3.75 MG PO TABS
3.7500 mg | ORAL_TABLET | Freq: Every day | ORAL | Status: DC
  Administered 2023-03-09 (×2): 3.75 mg via ORAL
  Filled 2023-03-08 (×4): qty 1

## 2023-03-08 MED ORDER — LEVETIRACETAM 250 MG PO TABS
250.0000 mg | ORAL_TABLET | Freq: Two times a day (BID) | ORAL | Status: DC
  Administered 2023-03-08 – 2023-03-10 (×4): 250 mg via ORAL
  Filled 2023-03-08 (×5): qty 1

## 2023-03-08 MED ORDER — SODIUM CHLORIDE 0.9 % IV SOLN: 10.0000 mL/h | Freq: Once | INTRAVENOUS | Status: DC

## 2023-03-08 MED ORDER — ATORVASTATIN CALCIUM 20 MG PO TABS
40.0000 mg | ORAL_TABLET | Freq: Every day | ORAL | Status: DC
  Administered 2023-03-09 – 2023-03-10 (×2): 40 mg via ORAL
  Filled 2023-03-08 (×2): qty 2

## 2023-03-08 MED ORDER — DILTIAZEM HCL 25 MG/5ML IV SOLN
10.0000 mg | Freq: Once | INTRAVENOUS | Status: AC
  Administered 2023-03-08: 10 mg via INTRAVENOUS
  Filled 2023-03-08: qty 5

## 2023-03-08 MED ORDER — ONDANSETRON HCL 4 MG PO TABS: 4.0000 mg | ORAL_TABLET | Freq: Four times a day (QID) | ORAL | Status: DC | PRN

## 2023-03-08 MED ORDER — ONDANSETRON HCL 4 MG/2ML IJ SOLN: 4.0000 mg | Freq: Four times a day (QID) | INTRAMUSCULAR | Status: DC | PRN

## 2023-03-08 MED ORDER — ACETAMINOPHEN 325 MG PO TABS
650.0000 mg | ORAL_TABLET | Freq: Four times a day (QID) | ORAL | Status: DC | PRN
  Administered 2023-03-08: 650 mg via ORAL
  Filled 2023-03-08: qty 2

## 2023-03-08 NOTE — ED Triage Notes (Signed)
Pt to ED via POV, pt here with son who states that pt was discharged from [facility] on Monday. Pt was admitted on Saturday for altered mental status after fall. While pt was in the hospital her hemoglobin dropped from 13 on Saturday to 8 on Monday. Pt was given 3 iron infusions Monday prior to being discharged. Pt had follow up appt this morning and her PCP called her and told her she needed to come back to the hospital for evaluation because her hemoglobin had dropped to 6. Son states that she has a large bruise left hip and left knee. Pt son states that she also has a lot of swelling in the left leg as well. Pt had neg head and hip CT at [facility].

## 2023-03-08 NOTE — ED Provider Notes (Signed)
[facility] Provider Note    Event Date/Time   First MD Initiated Contact with Patient 03/08/23 1528     (approximate)   History   No chief complaint on file.   HPI  Mary Thornton is a 82 y.o. female with past medical history of atrial fibrillation on reduced dose Eliquis, coronary disease hypertension hyperlipidemia MGUS, Barrett's esophagus who presents because of abnormal labs.  Was recently admitted to [facility] for delirium the thought was that it could be medication induced versus seizure.  She is supposed to follow-up outpatient.  Patient also was noted to have multiple falls.  During the admission she was found to have worsening anemia.  Hemoglobin down trended from 10.7-9 0.2-7.7 back to 8.3.  Did have CT that showed a large hematoma and therefore Eliquis was held.  She did receive iron infusions.  Hemoglobin has stable by 5/6  At 8.3. Patient has been feeling lightheaded and weak since leaving the hospital.  Her son notes that her blood pressure has also been lower in the 100 systolic where its typically in the 140s.  Today she followed up with her primary doctor who drew labs.  She was called and told to come to the emergency department because of anemia.  Patient did have a fall prior to her hospitalization at [facility] and had significant hematoma.  CT of the left lower extremity during this admission showed a large subcutaneous hematoma adjacent to the left gluteus musculature measuring 4 x 5 x 4 0.2 x 10.8 cm.  His other bleeding.  Denies melena or blood in her stool.     Past Medical History:  Diagnosis Date   Arthritis    Asthma    Barrett esophagus    Complication of anesthesia    very hard to wake up. feels like she had too much medicine. 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   PONV (postoperative nausea and vomiting)    PSVT (paroxysmal supraventricular tachycardia)    Reactive airway disease    Recurrent sinusitis    Reflux esophagitis    Seasonal allergies    Torticollis    Torticollis     Patient Active Problem List   Diagnosis Date Noted   MGUS (monoclonal gammopathy of unknown significance) 08/17/2022   Atrial fibrillation with RVR (HCC) 07/08/2022   Essential hypertension 07/08/2022   Dyslipidemia 07/08/2022   GERD without esophagitis 07/08/2022   Coronary artery disease 07/08/2022   Lumbar radiculopathy 08/22/2018   Chest pain 10/30/2017   Cystocele 06/15/2016   Vaginal atrophy 06/15/2016   Unstable bladder 06/15/2016   ARF (acute renal failure) (HCC) 05/14/2016     Physical Exam  Triage Vital Signs: ED Triage Vitals [03/08/23 1502]  Enc Vitals Group     BP 123/60     Pulse Rate 66     Resp 16     Temp 98 F (36.7 C)     Temp Source Oral     SpO2 98 %     Weight 114 lb (51.7 kg)     Height 5'  3" (1.6 m)     Head Circumference      Peak Flow      Pain Score 2     Pain Loc      Pain Edu?      Excl. in GC?     Most recent vital signs: Vitals:   03/08/23 1830 03/08/23 1845  BP: 106/76 116/80  Pulse: 65 (!) 127  Resp: (!) 21 (!) 36  Temp:    SpO2:  97%     General: Awake, no distress.  CV:  Good peripheral perfusion.  Resp:  Normal effort.  Abd:  No distention.  Neuro:             Awake, Alert, Oriented x 3  Other:  Large area of bruising extending from the left buttock down to the posterior thigh and calf with some induration no fluctuance, compartment is soft   ED Results / Procedures / Treatments  Labs (all labs ordered are listed, but only abnormal results are displayed) Labs Reviewed  CBC WITH DIFFERENTIAL/PLATELET - Abnormal; Notable for the  following components:      Result Value   RBC 2.15 (*)    Hemoglobin 6.8 (*)    HCT 21.1 (*)    nRBC 0.3 (*)    Monocytes Absolute 1.3 (*)    Abs Immature Granulocytes 0.11 (*)    All other components within normal limits  BASIC METABOLIC PANEL - Abnormal; Notable for the following components:   Calcium 8.5 (*)    All other components within normal limits  TYPE AND SCREEN  PREPARE RBC (CROSSMATCH)     EKG     RADIOLOGY    PROCEDURES:  Critical Care performed: Yes, see critical care procedure note(s)  .Critical Care  Performed by: Georga Hacking, MD Authorized by: Georga Hacking, MD   Critical care provider statement:    Critical care time (minutes):  30   Critical care was time spent personally by me on the following activities:  Development of treatment plan with patient or surrogate, discussions with consultants, evaluation of patient's response to treatment, examination of patient, ordering and review of laboratory studies, ordering and review of radiographic studies, ordering and performing treatments and interventions, pulse oximetry, re-evaluation of patient's condition and review of old charts   The patient is on the cardiac monitor to evaluate for evidence of arrhythmia and/or significant heart rate changes.   MEDICATIONS ORDERED IN ED: Medications  0.9 %  sodium chloride infusion (0 mL/hr Intravenous Hold 03/08/23 1715)  diltiazem (CARDIZEM) 125 mg in dextrose 5% 125 mL (1 mg/mL) infusion (has no administration in time range)  diltiazem (CARDIZEM) injection 10 mg (10 mg Intravenous Given 03/08/23 1849)     IMPRESSION / MDM / ASSESSMENT AND PLAN / ED COURSE  I reviewed the triage vital signs and the nursing notes.                              Patient's presentation is most consistent with acute presentation with potential threat to life or bodily function.  Differential diagnosis includes, but is not limited to, blood loss anemia from hematoma, GI  bleed, bone marrow suppression,  The patient is an 82 year old female who presents with low hemoglobin on outpatient labs.  Patient was recently admitted to [facility] for altered mental status which was thought to be either due to polypharmacy versus seizures.  She did have a fall resulting in  large hematoma on her left buttock and thigh which she did undergo a CT scan for which did not show any fracture but did show a large 4 x 4 x 8 cm hematoma.  Her hemoglobin did drop during this admission but stabilized on 5/6 at 8.3.  She followed up with her primary care doctor today who drew labs and hemoglobin was in the sixes.  Patient has felt lightheaded and weak since admission.  She denies any melena or blood in her stool or bleeding.  She has not needed blood transfusions in the past.  She is hemodynamically stable.  Looks well on exam.  Does have a very large hematoma extending from the left buttock all the way down to the mid calf.  The compartment is soft.  Looks to be subacute.  Hemoglobin here is 6.8.  I did do a rectal exam patient had empty rectal vault denies bleeding or black stool by report.  Suspect that the anemia is due to blood loss from the fall and hematoma.  Given she is symptomatic we will give a unit of blood.  I do think that she will need to be observed in the hospital to make sure her hemoglobin is staying stable and that we are not missing another cause of anemia.  Of note patient's Eliquis has been held for several days.  Of note patient did go into A-fib with RVR with heart rates in the 120s to 140s.  She received a bolus of IV dill and was started on dill drip.   FINAL CLINICAL IMPRESSION(S) / ED DIAGNOSES   Final diagnoses:  Symptomatic anemia  Atrial fibrillation with RVR (HCC)     Rx / DC Orders   ED Discharge Orders     None        Note:  This document was prepared using Dragon voice recognition software and may include unintentional dictation errors.   Georga Hacking, MD 03/08/23 [phone]

## 2023-03-08 NOTE — H&P (Signed)
History and Physical     Patient: Mary Thornton KKX:[id number] DOB: 01-07-1941 DOA: 03/08/2023 DOS: the patient was seen and examined on 03/09/2023 PCP: Marguarite Arbour, MD   Patient coming from: Home  Chief Complaint: Anemia  HISTORY OF PRESENT ILLNESS: Mary Thornton is an 82 y.o. female discharged from her recent admission at [facility] where patient was admitted after a fall.  Patient was noted to have bruising and hematoma and anemia that was stable at [facility] and was discharged.  Patient was following up with her PCP today who repeated blood work that showed anemia.  Patient was then asked to come to the emergency room for her anemia.  Patient's Eliquis was discontinued after her fall and bruise earlier when she was at [facility] and is currently not on any anticoagulation and upon arrival in the emergency room patient was noted to be in A-fib RVR patient is clinically asymptomatic and does not have any chest pain palpitations or shortness of breath.  And blood was started in the emergency room for her anemia.  Past Medical History:  Diagnosis Date   Arthritis    Asthma    Barrett esophagus    Complication of anesthesia    very hard to wake up. feels like she had too much medicine. 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       Left hip pain.   All other systems reviewed and  are negative.  Allergies  Allergen Reactions   Ibuprofen Other (See Comments)    History of esophagogastritis and barretts esophagus No bc powder or like products   Chlorhexidine Rash   Cymbalta [Duloxetine Hcl] Itching   Penicillins Itching and Rash    Has patient had a PCN reaction causing immediate rash, facial/tongue/throat swelling, SOB or lightheadedness with hypotension: No Has patient had a PCN reaction causing severe rash involving mucus membranes or skin necrosis: No Has patient had a PCN reaction that required hospitalization: No Has patient had a PCN reaction occurring within the last 10 years: No If all of the above answers are "NO", then may proceed with Cephalosporin use.   Sulfa Antibiotics Rash   Past Surgical History:  Procedure Laterality Date   ABDOMINAL HYSTERECTOMY     BREAST EXCISIONAL BIOPSY Left 1980   neg   CATARACT EXTRACTION Right 03/13/2018   Dr. Elmer Picker   COLONOSCOPY WITH PROPOFOL N/A 06/23/2015   Procedure: COLONOSCOPY WITH PROPOFOL;  Surgeon: Christena Deem, MD;  Location: [facility] ENDOSCOPY;  Service: Endoscopy;  Laterality: N/A;   COLONOSCOPY WITH PROPOFOL N/A 08/11/2020   Procedure: COLONOSCOPY WITH PROPOFOL;  Surgeon: Regis Bill, MD;  Location: [facility] ENDOSCOPY;  Service: Endoscopy;  Laterality: N/A;   ESOPHAGOGASTRODUODENOSCOPY (EGD) WITH PROPOFOL N/A 06/23/2015   Procedure: ESOPHAGOGASTRODUODENOSCOPY (EGD)  WITH PROPOFOL;  Surgeon: Christena Deem, MD;  Location: [facility] ENDOSCOPY;  Service: Endoscopy;  Laterality: N/A;   ESOPHAGOGASTRODUODENOSCOPY (EGD) WITH PROPOFOL N/A 11/28/2017   Procedure: ESOPHAGOGASTRODUODENOSCOPY (EGD) WITH PROPOFOL;  Surgeon: Christena Deem, MD;  Location: [facility] ENDOSCOPY;  Service: Endoscopy;  Laterality: N/A;   ESOPHAGOGASTRODUODENOSCOPY (EGD) WITH PROPOFOL N/A 01/18/2018   Procedure: ESOPHAGOGASTRODUODENOSCOPY (EGD) WITH PROPOFOL;  Surgeon: Christena Deem, MD;  Location: [facility] ENDOSCOPY;  Service: Endoscopy;   Laterality: N/A;   ESOPHAGOGASTRODUODENOSCOPY (EGD) WITH PROPOFOL N/A 08/11/2020   Procedure: ESOPHAGOGASTRODUODENOSCOPY (EGD) WITH PROPOFOL;  Surgeon: Regis Bill, MD;  Location: [facility] ENDOSCOPY;  Service: Endoscopy;  Laterality: N/A;   JOINT REPLACEMENT Left 2009   total knee replacement   KNEE ARTHROSCOPY     LUMBAR LAMINECTOMY/DECOMPRESSION MICRODISCECTOMY N/A 08/22/2018   Procedure: LUMBAR LAMINECTOMY/DECOMPRESSION MICRODISCECTOMY 1 LEVEL-L2-5;  Surgeon: Venetia Night, MD;  Location: [facility] ORS;  Service: Neurosurgery;  Laterality: N/A;   Torticollis surgery x 2     MEDICATIONS: Prior to Admission medications   Medication Sig Start Date End Date Taking? Authorizing Provider  acetaminophen (TYLENOL) 325 MG tablet Take 650 mg by mouth every 6 (six) hours as needed.    [provider]  apixaban (ELIQUIS) 2.5 MG TABS tablet Take 1 tablet (2.5 mg total) by mouth 2 (two) times daily. 07/13/22   Hollice Espy, MD  atorvastatin (LIPITOR) 40 MG tablet Take 1 tablet (40 mg total) by mouth daily. 10/31/17 07/15/21  Altamese Dilling, MD  Azelastine HCl 137 MCG/SPRAY SOLN Place 1 spray into both nostrils 2 (two) times daily. 06/29/22   [provider]  baclofen (LIORESAL) 20 MG tablet Take 20 mg by mouth 2 (two) times daily. 05/20/22   [provider]  calcium-vitamin D (OSCAL WITH D) 500-200 MG-UNIT tablet Take 1 tablet by mouth daily.    [provider]  diltiazem (CARDIZEM) 60 MG tablet Take by mouth. 07/26/22   [provider]  feeding supplement (ENSURE ENLIVE / ENSURE PLUS) LIQD Take 237 mLs by mouth 2 (two) times daily between meals. 07/13/22   Hollice Espy, MD  loratadine (CLARITIN) 10 MG tablet Take 1 tablet (10 mg total) by mouth daily. 05/30/18 08/17/22  Willy Eddy, MD  losartan (COZAAR) 25 MG tablet Take 2 tablets (50 mg total) by mouth 2 (two) times daily. 07/13/22   Hollice Espy, MD  melatonin 5 MG TABS Take 1  tablet (5 mg total) by mouth at bedtime as needed. 07/13/22   Hollice Espy, MD  metoprolol tartrate (LOPRESSOR) 25 MG tablet Take 0.5 tablets (12.5 mg total) by mouth 2 (two) times daily. 07/13/22   Hollice Espy, MD  mirtazapine (REMERON) 15 MG tablet Take 15 mg by mouth at bedtime. 06/16/22   [provider]  montelukast (SINGULAIR) 10 MG tablet Take 10 mg by mouth daily.     [provider]  Multiple Vitamin (MULTIVITAMIN) tablet Take 1 tablet by mouth daily at 3 pm.     [provider]  Multiple Vitamins-Minerals (PRESERVISION AREDS PO) Take 2 capsules by mouth 2 (two) times daily. Take one capsule in the morning and one in the evening    [provider]  pantoprazole (PROTONIX) 40 MG tablet Take 40 mg by mouth daily before breakfast.  09/25/17   [provider]  timolol (TIMOPTIC) 0.25 % ophthalmic solution Place 1 drop into both eyes 2 (two) times daily. @0800 /1700    [provider]    sodium chloride Stopped (  03/08/23 1715)   diltiazem (CARDIZEM) infusion 15 mg/hr (03/08/23 2141)   ED Course: Pt in Ed alert awake afebrile O2 sats of 97% tachycardic in A-fib RVR on telemetry and EKG. Vitals:   03/08/23 2200 03/08/23 2300 03/08/23 2355 03/09/23 0000  BP: 104/65 108/62  118/69  Pulse: (!) 106 98  (!) 111  Resp: 16 18  (!) 22  Temp:   98.5 F (36.9 C)   TempSrc:      SpO2:   96% 96%  Weight:      Height:       Total I/O In: 290 [Blood:290] Out: 800 [Urine:800] SpO2: 96 % Blood work in ed shows: BMP shows calcium of 8.5 and is normal otherwise no LFTs will order. CBC shows a normal white count normal platelet count normal MCV and normal RDW with a hemoglobin of 6.8.  Results for orders placed or performed during the hospital encounter of 03/08/23 (from the past 72 hour(s))  CBC with Differential     Status: Abnormal   Collection Time: 03/08/23  3:06 PM  Result Value Ref Range   WBC 9.6 4.0 - 10.5 K/uL   RBC 2.15  (L) 3.87 - 5.11 MIL/uL   Hemoglobin 6.8 (L) 12.0 - 15.0 g/dL   HCT 95.6 (L) 21.3 - 08.6 %   MCV 98.1 80.0 - 100.0 fL   MCH 31.6 26.0 - 34.0 pg   MCHC 32.2 30.0 - 36.0 g/dL   RDW 57.8 46.9 - 62.9 %   Platelets 267 150 - 400 K/uL   nRBC 0.3 (H) 0.0 - 0.2 %   Neutrophils Relative % 66 %   Neutro Abs 6.3 1.7 - 7.7 K/uL   Lymphocytes Relative 18 %   Lymphs Abs 1.7 0.7 - 4.0 K/uL   Monocytes Relative 13 %   Monocytes Absolute 1.3 (H) 0.1 - 1.0 K/uL   Eosinophils Relative 2 %   Eosinophils Absolute 0.2 0.0 - 0.5 K/uL   Basophils Relative 0 %   Basophils Absolute 0.0 0.0 - 0.1 K/uL   Immature Granulocytes 1 %   Abs Immature Granulocytes 0.11 (H) 0.00 - 0.07 K/uL    Comment: Performed at [facility], [address], [place], [place] [address]  Basic metabolic panel     Status: Abnormal   Collection Time: 03/08/23  3:06 PM  Result Value Ref Range   Sodium 138 135 - 145 mmol/L   Potassium 3.8 3.5 - 5.1 mmol/L   Chloride 104 98 - 111 mmol/L   CO2 25 22 - 32 mmol/L   Glucose, Bld 99 70 - 99 mg/dL    Comment: Glucose reference range applies only to samples taken after fasting for at least 8 hours.   BUN 15 8 - 23 mg/dL   Creatinine, Ser 3.24 0.44 - 1.00 mg/dL   Calcium 8.5 (L) 8.9 - 10.3 mg/dL   GFR, Estimated >40 >10 mL/min    Comment: (NOTE) Calculated using the CKD-EPI Creatinine Equation (2021)    Anion gap 9 5 - 15    Comment: Performed at [facility], [address] [place], [place] [address]  T4, free     Status: Abnormal   Collection Time: 03/08/23  3:06 PM  Result Value Ref Range   Free T4 1.18 (H) 0.61 - 1.12 ng/dL    Comment: (NOTE) Biotin ingestion may interfere with free T4 tests. If the results are inconsistent with the TSH level, previous test results, or the clinical presentation, then consider  biotin interference. If needed, order repeat testing after stopping biotin. Performed at [facility], [address] [place], [place]  [address]   TSH     Status: None   Collection Time: 03/08/23  3:06 PM  Result Value Ref Range   TSH 3.618 0.350 - 4.500 uIU/mL    Comment: Performed by a 3rd Generation assay with a functional sensitivity of <=0.01 uIU/mL. Performed at [facility], [address] [place], [place] [address]   Type and screen [facility]     Status: None (Preliminary result)   Collection Time: 03/08/23  3:10 PM  Result Value Ref Range   ABO/RH(D) O POS    Antibody Screen NEG    Sample Expiration 03/11/2023,2359    Unit Number [id number]    Blood Component Type RED CELLS,LR    Unit division 00    Status of Unit ISSUED    Transfusion Status OK TO TRANSFUSE    Crossmatch Result      Compatible Performed at [facility], [address] [place], [place] [address]   Prepare RBC (crossmatch)     Status: None   Collection Time: 03/08/23  5:00 PM  Result Value Ref Range   Order Confirmation      ORDER PROCESSED BY BLOOD BANK Performed at [facility], [address] [place], [place] [address]     Lab Results  Component Value Date   CREATININE 0.56 03/08/2023   CREATININE 0.60 08/10/2022   CREATININE 0.46 07/13/2022      Latest Ref Rng & Units 03/08/2023    3:06 PM 08/10/2022    9:12 AM 07/13/2022    6:32 AM  CMP  Glucose 70 - 99 mg/dL 99  71  536   BUN 8 - 23 mg/dL 15  16  15    Creatinine 0.44 - 1.00 mg/dL 6.44  0.34  7.42   Sodium 135 - 145 mmol/L 138  137  138   Potassium 3.5 - 5.1 mmol/L 3.8  4.3  3.8   Chloride 98 - 111 mmol/L 104  102  105   CO2 22 - 32 mmol/L 25  30  28    Calcium 8.9 - 10.3 mg/dL 8.5  8.6  8.4   Total Protein 6.5 - 8.1 g/dL  6.5    Total Bilirubin 0.3 - 1.2 mg/dL  0.4    Alkaline Phos 38 - 126 U/L  75    AST 15 - 41 U/L  25    ALT 0 - 44 U/L  24     Unresulted Labs (From admission, onward)    None      Pt has received : Orders Placed This Encounter  Procedures   Critical Care    This order was created  via procedure documentation    Standing Status:   Standing    Number of Occurrences:   1   CT PELVIS LIMITED WO CONTRAST    Standing Status:   Standing    Number of Occurrences:   1   CBC with Differential    Standing Status:   Standing    Number of Occurrences:   1   Basic metabolic panel    Standing Status:   Standing    Number of Occurrences:   1   T4, free    Standing Status:   Standing    Number of Occurrences:   1   TSH    Standing Status:   Standing  Number of Occurrences:   1   Amb referral to AFIB Clinic    Referral Priority:   Routine    Referral Type:   Consultation    Number of Visits Requested:   1   Diet NPO time specified    Standing Status:   Standing    Number of Occurrences:   1   Informed Consent Details: Physician/Practitioner Attestation; Transcribe to consent form and obtain patient signature    Standing Status:   Standing    Number of Occurrences:   1    Order Specific Question:   Physician/Practitioner attestation of informed consent for blood and or blood product transfusion    Answer:   I, the physician/practitioner, attest that I have discussed with the patient the benefits, risks, side effects, alternatives, likelihood of achieving goals and potential problems during recovery for the procedure that I have provided informed consent.    Order Specific Question:   Product(s)    Answer:   All Product(s)   Notify physician (specify)    Standing Status:   Standing    Number of Occurrences:   1    Order Specific Question:   Notify Physician    Answer:   HR<60 or >130    Order Specific Question:   Notify Physician    Answer:   SBP <90    Order Specific Question:   Notify Physician    Answer:   Pauses >2 sec    Order Specific Question:   Notify Physician    Answer:   new arrhythmia    Order Specific Question:   Notify Physician    Answer:   return to SR    Order Specific Question:   Notify Physician    Answer:   signs & symptoms of MI   Strict  intake and output    Standing Status:   Standing    Number of Occurrences:   1   Target HR: 65-105 bpm with return to baseline rhythm and hemodynamic stability    with return to baseline rhythm and hemodynamic stability    Standing Status:   Standing    Number of Occurrences:   1    Order Specific Question:   Target HR    Answer:   65-105 bpm   Apply Atrial Arrhythmia Care Plan    Standing Status:   Standing    Number of Occurrences:   1   Cardiac Monitoring - Continuous Indefinite    Standing Status:   Standing    Number of Occurrences:   1    Order Specific Question:   Indications for use:    Answer:   ICU/Stepdown patient   Vital signs    Standing Status:   Standing    Number of Occurrences:   1   Notify physician (specify)    Standing Status:   Standing    Number of Occurrences:   20    Order Specific Question:   Notify Physician    Answer:   for pulse less than 55 or greater than 120    Order Specific Question:   Notify Physician    Answer:   for respiratory rate less than 12 or greater than 25    Order Specific Question:   Notify Physician    Answer:   for temperature greater than 100.5 F    Order Specific Question:   Notify Physician    Answer:   for urinary output less than 30 mL/hr for four  hours    Order Specific Question:   Notify Physician    Answer:   for systolic BP less than 90 or greater than 160, diastolic BP less than 60 or greater than 100   Do not place and if present remove PureWick    Standing Status:   Standing    Number of Occurrences:   1   Initiate Oral Care Protocol    Standing Status:   Standing    Number of Occurrences:   1   Initiate Carrier Fluid Protocol    Standing Status:   Standing    Number of Occurrences:   1   RN may order General Admission PRN Orders utilizing "General Admission PRN medications" (through manage orders) for the following patient needs: allergy symptoms (Claritin), cold sores (Carmex), cough (Robitussin DM), eye  irritation (Liquifilm Tears), hemorrhoids (Tucks), indigestion (Maalox), minor skin irritation (Hydrocortisone Cream), muscle pain Romeo Apple Gay), nose irritation (saline nasal spray) and sore throat (Chloraseptic spray).    Standing Status:   Standing    Number of Occurrences:   99999   Daily weights    Standing Status:   Standing    Number of Occurrences:   1   Swallow screen    Standing Status:   Standing    Number of Occurrences:   1   Bed rest    Standing Status:   Standing    Number of Occurrences:   1   Intake and Output    Standing Status:   Standing    Number of Occurrences:   1   Neuro checks    Standing Status:   Standing    Number of Occurrences:   1   Full code    Standing Status:   Standing    Number of Occurrences:   1    Order Specific Question:   By:    Answer:   Other   Consult to hospitalist    Standing Status:   Standing    Number of Occurrences:   1    Order Specific Question:   Place call to:    Answer:   [phone]    Order Specific Question:   Reason for Consult    Answer:   Admit    Order Specific Question:   Diagnosis/Clinical Info for Consult:    Answer:   anemia, a fib rvr   Consult to hospitalist    Standing Status:   Standing    Number of Occurrences:   1    Order Specific Question:   Place call to:    Answer:   [phone]    Order Specific Question:   Reason for Consult    Answer:   Admit   Pulse oximetry check with vital signs    Standing Status:   Standing    Number of Occurrences:   1   Oxygen therapy Mode or (Route): Nasal cannula; Liters Per Minute: 2; Keep 02 saturation: greater than 92 %    Standing Status:   Standing    Number of Occurrences:   1    Order Specific Question:   Mode or (Route)    Answer:   Nasal cannula    Order Specific Question:   Liters Per Minute    Answer:   2    Order Specific Question:   Keep 02 saturation    Answer:   greater than 92 %   ED EKG    Standing Status:   Standing  Number of Occurrences:   1     Order Specific Question:   Reason for Exam    Answer:   Chest Pain   EKG 12-lead    Standing Status:   Standing    Number of Occurrences:   20    Order Specific Question:   Notes    Answer:   atrial fibrillation / atrial flutter   Type and screen [facility]    [facility]     Standing Status:   Standing    Number of Occurrences:   1    Order Specific Question:   Special Requirements    Answer:   for Red Blood Exchange   Prepare RBC (crossmatch)    Standing Status:   Standing    Number of Occurrences:   1    Order Specific Question:   # of Units    Answer:   1 unit    Order Specific Question:   Transfusion Indications    Answer:   Hemoglobin < 7 gm/dL and symptomatic    Order Specific Question:   Number of Units to Keep Ahead    Answer:   NO units ahead    Order Specific Question:   If emergent release call blood bank    Answer:   Not emergent release   Admit to Inpatient (patient's expected length of stay will be greater than 2 midnights or inpatient only procedure)    Standing Status:   Standing    Number of Occurrences:   1    Order Specific Question:   Hospital Area    Answer:   [facility] [[id number]]    Order Specific Question:   Level of Care    Answer:   Telemetry Cardiac [103]    Order Specific Question:   Covid Evaluation    Answer:   Asymptomatic - no recent exposure (last 10 days) testing not required    Order Specific Question:   Diagnosis    Answer:   Anemia [242168]    Order Specific Question:   Admitting Physician    Answer:   Darrold Junker    Order Specific Question:   Attending Physician    Answer:   Darrold Junker    Order Specific Question:   Certification:    Answer:   I certify this patient will need inpatient services for at least 2 midnights    Order Specific Question:   Estimated Length of Stay    Answer:   2   Aspiration precautions    Standing Status:   Standing     Number of Occurrences:   1   Seizure precautions    Standing Status:   Standing    Number of Occurrences:   1   Fall precautions    Standing Status:   Standing    Number of Occurrences:   1    Meds ordered this encounter  Medications   0.9 %  sodium chloride infusion   diltiazem (CARDIZEM) injection 10 mg   diltiazem (CARDIZEM) 125 mg in dextrose 5% 125 mL (1 mg/mL) infusion   atorvastatin (LIPITOR) tablet 40 mg   clorazepate (TRANXENE) tablet 3.75 mg   levETIRAcetam (KEPPRA) tablet 250 mg   OR Linked Order Group    acetaminophen (TYLENOL) tablet 650 mg    acetaminophen (TYLENOL) suppository 650 mg   OR Linked Order Group    ondansetron (ZOFRAN) tablet 4 mg  ondansetron (ZOFRAN) injection 4 mg    Admission Imaging : US Venous Img Lower Unilateral Left (DVT)  Result Date: 03/08/2023 CLINICAL DATA:  Leg swelling EXAM: LEFT LOWER EXTREMITY VENOUS DOPPLER ULTRASOUND TECHNIQUE: Gray-scale sonography with compression, as well as color and duplex ultrasound, were performed to evaluate the deep venous system(s) from the level of the common femoral vein through the popliteal and proximal calf veins. COMPARISON:  None Available. FINDINGS: VENOUS Normal compressibility of the common femoral, superficial femoral, and popliteal veins, as well as the visualized calf veins. Visualized portions of profunda femoral vein and great saphenous vein unremarkable. No filling defects to suggest DVT on grayscale or color Doppler imaging. Doppler waveforms show normal direction of venous flow, normal respiratory plasticity and response to augmentation. Limited views of the contralateral common femoral vein are unremarkable. OTHER Left lower extremity soft tissue edema. Atherosclerotic disease of the bilateral common femoral arteries. Benign-appearing prominent left groin lymph node measuring 2.1 x 0.8 x 1.4 cm. Limitations: none IMPRESSION: No evidence of left lower extremity DVT. Electronically Signed   By: Allegra Lai M.D.   On: 03/08/2023 13:41   Physical Examination: Vitals:   03/08/23 2200 03/08/23 2300 03/08/23 2355 03/09/23 0000  BP: 104/65 108/62  118/69  Pulse: (!) 106 98  (!) 111  Temp:   98.5 F (36.9 C)   Resp: 16 18  (!) 22  Height:      Weight:      SpO2:   96% 96%  TempSrc:      BMI (Calculated):       Physical Exam Vitals and nursing note reviewed.  Constitutional:      General: She is not in acute distress.    Appearance: She is not ill-appearing, toxic-appearing or diaphoretic.  HENT:     Head: Normocephalic and atraumatic.     Right Ear: Hearing and external ear normal.     Left Ear: Hearing and external ear normal.     Nose: Nose normal. No nasal deformity.     Mouth/Throat:     Lips: Pink.     Mouth: Mucous membranes are moist.     Tongue: No lesions.     Pharynx: Oropharynx is clear.  Eyes:     Extraocular Movements: Extraocular movements intact.     Pupils: Pupils are equal, round, and reactive to light.  Cardiovascular:     Rate and Rhythm: Tachycardia present. Rhythm irregular.     Pulses: Normal pulses.     Heart sounds: Normal heart sounds.  Pulmonary:     Effort: Pulmonary effort is normal.     Breath sounds: Normal breath sounds.  Abdominal:     General: Bowel sounds are normal. There is no distension.     Palpations: Abdomen is soft. There is no mass.     Tenderness: There is no abdominal tenderness. There is no guarding.     Hernia: No hernia is present.  Musculoskeletal:     Right lower leg: No edema.     Left lower leg: No edema.  Skin:    General: Skin is warm.     Comments: Hematoma.  Neurological:     General: No focal deficit present.     Mental Status: She is alert and oriented to person, place, and time.     Cranial Nerves: Cranial nerves 2-12 are intact.     Motor: Motor function is intact.  Psychiatric:        Attention and Perception: Attention normal.  Mood and Affect: Mood normal.        Speech: Speech normal.         Behavior: Behavior normal. Behavior is cooperative.        Cognition and Memory: Cognition normal.     Assessment and Plan: * Anemia 2/2 bleeding and bruising on left hip.  Eliquis is held.  Pt transfused in ED.   Atrial fibrillation with RVR (HCC) Currently anticoagulation contraindicated. We will admit pt to stepdown and started on diltiazem drip. Resume metoprolol once pt converts or rate is controlled.  Cardiology consult as needed.   Coronary artery disease Stable clinically no symptoms.  We will cont atorvastatin.  Losartan held as pt is on diltiazem gtt,  GERD without esophagitis IV PPI therapy.   Essential hypertension Vitals:   03/08/23 1945 03/08/23 2000 03/08/23 2015 03/08/23 2030  BP: 116/81 120/88 127/81 106/75   03/08/23 2045 03/08/23 2100 03/08/23 2115 03/08/23 2130  BP: 121/77 (!) 144/90 126/73 116/65   03/08/23 2145 03/08/23 2200 03/08/23 2300 03/09/23 0000  BP: 116/68 104/65 108/62 118/69  Stable we will follow.      DVT prophylaxis:  scd's    Code Status:  Full code.      03/08/2023    3:04 PM  Advanced Directives  Does Patient Have a Medical Advance Directive? Yes  Type of Advance Directive Living will;Healthcare Power of Attorney    Family Communication:  None.  Emergency Contact: Contact Information     Name Relation Home Work Mobile   Murdo Son   [phone]   Trionna, Serrao  [phone]       Disposition Plan:  Home. Consults: None. Admission status: Inpatient.    Unit / Expected LOS: Cardiac tele/ 2 days/   Gertha Calkin MD [facility]  6 PM- 2 AM. [phone]( Pager )  For questions regarding this patient please use [email or web address] to contact the current [facility] MD.   Bonita Quin may also call [phone] to contact current Assigned [facility] Attending/Consulting MD for this patient.

## 2023-03-08 NOTE — ED Notes (Signed)
Took pt to restroom via wheelchair, pt is a 1+ assist. She had a fall on Saturday and had been on blood thinners at the time. While helping pt use the restroom, this RN noted dark purple bruising above the pt's coccyx, on her left hip and thigh.  Pt also stated that she has swelling and some bruising on her left shin. Pt was taken back to the stretcher and helped to transfer from wheelchair to stretcher. Pt bed in lowest, locked position.

## 2023-03-09 ENCOUNTER — Inpatient Hospital Stay: Payer: Medicare HMO

## 2023-03-09 ENCOUNTER — Encounter: Payer: Self-pay | Admitting: Internal Medicine

## 2023-03-09 DIAGNOSIS — D508 Other iron deficiency anemias: Secondary | ICD-10-CM | POA: Diagnosis not present

## 2023-03-09 DIAGNOSIS — I4891 Unspecified atrial fibrillation: Secondary | ICD-10-CM

## 2023-03-09 DIAGNOSIS — I10 Essential (primary) hypertension: Secondary | ICD-10-CM | POA: Diagnosis not present

## 2023-03-09 DIAGNOSIS — K219 Gastro-esophageal reflux disease without esophagitis: Secondary | ICD-10-CM

## 2023-03-09 LAB — CBC
HCT: 24.7 % — ABNORMAL LOW (ref 36.0–46.0)
Hemoglobin: 7.8 g/dL — ABNORMAL LOW (ref 12.0–15.0)
MCH: 31.3 pg (ref 26.0–34.0)
MCHC: 31.6 g/dL (ref 30.0–36.0)
MCV: 99.2 fL (ref 80.0–100.0)
Platelets: 244 10*3/uL (ref 150–400)
RBC: 2.49 MIL/uL — ABNORMAL LOW (ref 3.87–5.11)
RDW: 14.4 % (ref 11.5–15.5)
WBC: 8.7 10*3/uL (ref 4.0–10.5)
nRBC: 0.3 % — ABNORMAL HIGH (ref 0.0–0.2)

## 2023-03-09 LAB — TYPE AND SCREEN
ABO/RH(D): O POS
Antibody Screen: NEGATIVE
Unit division: 0

## 2023-03-09 LAB — GLUCOSE, CAPILLARY: Glucose-Capillary: 104 mg/dL — ABNORMAL HIGH (ref 70–99)

## 2023-03-09 LAB — BPAM RBC: ISSUE DATE / TIME: 202405081731

## 2023-03-09 MED ORDER — PANTOPRAZOLE SODIUM 40 MG PO TBEC
40.0000 mg | DELAYED_RELEASE_TABLET | Freq: Two times a day (BID) | ORAL | Status: DC
  Administered 2023-03-09 – 2023-03-10 (×2): 40 mg via ORAL
  Filled 2023-03-09 (×2): qty 1

## 2023-03-09 MED ORDER — METOPROLOL TARTRATE 25 MG PO TABS
25.0000 mg | ORAL_TABLET | Freq: Two times a day (BID) | ORAL | Status: DC
  Administered 2023-03-09 – 2023-03-10 (×3): 25 mg via ORAL
  Filled 2023-03-09 (×3): qty 1

## 2023-03-09 NOTE — Assessment & Plan Note (Addendum)
Start Protonix 40 mg twice daily

## 2023-03-09 NOTE — Assessment & Plan Note (Addendum)
Controlled for now continue metoprolol

## 2023-03-09 NOTE — Assessment & Plan Note (Addendum)
Stable

## 2023-03-09 NOTE — Assessment & Plan Note (Addendum)
2/2 bleeding and bruising/hematoma on left hip.  Eliquis is held.  Monitor H&H.  Will recheck CBC in the morning - Hemoglobin 7.8 today S/p 1 PRBC transfusion while in the ED for hemoglobin of 6.8 Patient did have a fall prior to her hospitalization at [facility] and had significant hematoma. CT of the left lower extremity during this admission showed a large subcutaneous hematoma adjacent to the left gluteus musculature measuring 4 x 5 x 4 0.2 x 10.8 cm.  Patient denies melena or blood in her stool.  EDP performed rectal exam while in the ED and there was no obvious blood

## 2023-03-09 NOTE — Assessment & Plan Note (Addendum)
Currently anticoagulation contraindicated due to severe anemia and large hematoma Rate is controlled on metoprolol.  Cardizem drip discontinued

## 2023-03-09 NOTE — Hospital Course (Signed)
82 y.o. female with past medical history of atrial fibrillation on reduced dose Eliquis, coronary disease hypertension hyperlipidemia MGUS, Barrett's esophagus admitted for anemia  5/9: Discontinued Cardizem drip and started on metoprolol.  Added Protonix and consulted palliative care

## 2023-03-09 NOTE — Progress Notes (Addendum)
  Progress Note   Patient: Mary Thornton ZOX:[id number] DOB: 21-Jan-1941 DOA: 03/08/2023     1 DOS: the patient was seen and examined on 03/09/2023   Brief hospital course: 82 y.o. female with past medical history of atrial fibrillation on reduced dose Eliquis, coronary disease hypertension hyperlipidemia MGUS, Barrett's esophagus admitted for anemia  5/9: Discontinued Cardizem drip and started on metoprolol.  Added Protonix and consulted palliative care  Assessment and Plan: * Anemia 2/2 bleeding and bruising/hematoma on left hip.  Eliquis is held.  Monitor H&H.  Will recheck CBC in the morning - Hemoglobin 7.8 today S/p 1 PRBC transfusion while in the ED for hemoglobin of 6.8 Patient did have a fall prior to her hospitalization at [facility] and had significant hematoma. CT of the left lower extremity during this admission showed a large subcutaneous hematoma adjacent to the left gluteus musculature measuring 4 x 5 x 4 0.2 x 10.8 cm.  Patient denies melena or blood in her stool.  EDP performed rectal exam while in the ED and there was no obvious blood   Atrial fibrillation with RVR (HCC) Currently anticoagulation contraindicated due to severe anemia and large hematoma Rate is controlled on metoprolol.  Cardizem drip discontinued  Coronary artery disease Stable   GERD without esophagitis Start Protonix 40 mg twice daily  Essential hypertension Controlled for now continue metoprolol          Subjective: No new issues.  Physical Exam: Vitals:   03/09/23 1200 03/09/23 1300 03/09/23 1500 03/09/23 1600  BP: 104/68 113/66 (!) 105/49 108/60  Pulse: 79 85 71 78  Resp: 20 (!) 22 20 (!) 22  Temp: 98.3 F (36.8 C)     TempSrc: Oral     SpO2:   98% 97%  Weight:      Height:       81 year old female lying in the bed comfortably without any acute distress Lungs clear to auscultation bilaterally Heart regular rate and rhythm Abdomen soft, benign Neuro awake and alert,  nonfocal Skin/musculoskeletal: She has a large area of bruising extending from the left buttock down to the posterior thigh and calf with some induration.  Compartment is soft.  This is likely hematoma from recent fall and being on Eliquis Data Reviewed:  Hemoglobin 7.8  Family Communication: Son/John updated over phone  Disposition: Status is: Inpatient Remains inpatient appropriate because: Monitoring for blood loss  Planned Discharge Destination: Home   DVT prophylaxis-SCDs Time spent: 35 minutes  Author: Delfino Lovett, MD 03/09/2023 4:54 PM  For on call review [email or web address].

## 2023-03-10 DIAGNOSIS — I2510 Atherosclerotic heart disease of native coronary artery without angina pectoris: Secondary | ICD-10-CM

## 2023-03-10 DIAGNOSIS — D508 Other iron deficiency anemias: Secondary | ICD-10-CM | POA: Diagnosis not present

## 2023-03-10 DIAGNOSIS — I10 Essential (primary) hypertension: Secondary | ICD-10-CM | POA: Diagnosis not present

## 2023-03-10 DIAGNOSIS — I4891 Unspecified atrial fibrillation: Secondary | ICD-10-CM | POA: Diagnosis not present

## 2023-03-10 LAB — GLUCOSE, CAPILLARY
Glucose-Capillary: 99 mg/dL (ref 70–99)
Glucose-Capillary: 95 mg/dL (ref 70–99)

## 2023-03-10 LAB — CBC
HCT: 26.6 % — ABNORMAL LOW (ref 36.0–46.0)
Hemoglobin: 8.7 g/dL — ABNORMAL LOW (ref 12.0–15.0)
MCH: 31.8 pg (ref 26.0–34.0)
MCHC: 32.7 g/dL (ref 30.0–36.0)
MCV: 97.1 fL (ref 80.0–100.0)
Platelets: 301 10*3/uL (ref 150–400)
RBC: 2.74 MIL/uL — ABNORMAL LOW (ref 3.87–5.11)
RDW: 14.4 % (ref 11.5–15.5)
WBC: 9.7 10*3/uL (ref 4.0–10.5)
nRBC: 0 % (ref 0.0–0.2)

## 2023-03-10 LAB — BASIC METABOLIC PANEL
Anion gap: 8 (ref 5–15)
BUN: 16 mg/dL (ref 8–23)
CO2: 25 mmol/L (ref 22–32)
Calcium: 8.4 mg/dL — ABNORMAL LOW (ref 8.9–10.3)
Chloride: 105 mmol/L (ref 98–111)
Creatinine, Ser: 0.56 mg/dL (ref 0.44–1.00)
GFR, Estimated: >60 mL/min (ref >60)
Glucose, Bld: 104 mg/dL — ABNORMAL HIGH (ref 70–99)
Potassium: 3.8 mmol/L (ref 3.5–5.1)
Sodium: 138 mmol/L (ref 135–145)

## 2023-03-10 NOTE — Progress Notes (Signed)
[facility] [employer]) Hospital Liaison Note  Notified by Marzella Schlein of patient/family request for [facility] Palliative services at home after discharge.   ACC liaison will follow patient for discharge disposition.   Please call with any Hospice/Palliative related questions or concerns.   Thank you for the opportunity to participate in this patient's care  Thea Gist RN, [facility] [phone]

## 2023-03-10 NOTE — Care Management Important Message (Signed)
Important Message  Patient Details  Name: Mary Thornton MRN: [id number] Date of Birth: 09/30/1941   Medicare Important Message Given:  N/A - LOS <3 / Initial given by admissions     Olegario Messier A Hairo Garraway 03/10/2023, 10:26 AM

## 2023-03-10 NOTE — TOC Initial Note (Signed)
Transition of Care [facility]) - Initial/Assessment Note    Patient Details  Name: Mary Thornton MRN: [id number] Date of Birth: 1941/10/30  Transition of Care [facility]) CM/SW Contact:    Chapman Fitch, RN Phone Number: 03/10/2023, 10:10 AM  Clinical Narrative:                  Admitted for: Anemia  Admitted from:home alone PCP: [facility] - family transports to appointments.  Daughter Olegario Messier to transport at discharge :Current home health/prior home health/DME: [employer] shower rails  Patient active with [facility] home health RN PT OT.  Brandi with [facility] notified of discharge.  MD ordered outpatient palliative.  Misty with [employer] notified         Patient Goals and CMS Choice            Expected Discharge Plan and Services         Expected Discharge Date: 03/10/23                                    Prior Living Arrangements/Services                       Activities of Daily Living Home Assistive Devices/Equipment: Eyeglasses ADL Screening (condition at time of admission) Patient's cognitive ability adequate to safely complete daily activities?: Yes Is the patient deaf or have difficulty hearing?: No Does the patient have difficulty seeing, even when wearing glasses/contacts?: No Does the patient have difficulty concentrating, remembering, or making decisions?: No Patient able to express need for assistance with ADLs?: Yes Does the patient have difficulty dressing or bathing?: No Independently performs ADLs?: Yes (appropriate for developmental age) Does the patient have difficulty walking or climbing stairs?: No Weakness of Legs: None Weakness of Arms/Hands: None  Permission Sought/Granted                  Emotional Assessment              Admission diagnosis:  Anemia [D64.9] Atrial fibrillation with RVR (HCC) [I48.91] Symptomatic anemia [D64.9] Patient Active Problem List   Diagnosis Date Noted   Anemia  03/08/2023   MGUS (monoclonal gammopathy of unknown significance) 08/17/2022   Atrial fibrillation with RVR (HCC) 07/08/2022   Essential hypertension 07/08/2022   Dyslipidemia 07/08/2022   GERD without esophagitis 07/08/2022   Coronary artery disease 07/08/2022   Lumbar radiculopathy 08/22/2018   Chest pain 10/30/2017   Cystocele 06/15/2016   Vaginal atrophy 06/15/2016   Unstable bladder 06/15/2016   ARF (acute renal failure) (HCC) 05/14/2016   PCP:  Marguarite Arbour, MD Pharmacy:   [phone] Closed - [place], [place] - [address] [place] [address] Phone: [phone] Fax: [phone]  [id number] [place], [place] - [address] [place] [address] Phone: [phone] Fax: [phone]  [facility] - [place], [place] - [address] [place] [address] Phone: [phone] Fax: [phone]     Social Determinants of Health (SDOH) Social History: SDOH Screenings   Food Insecurity: No Food Insecurity (03/09/2023)  Housing: Low Risk  (03/09/2023)  Transportation Needs: No Transportation Needs (03/09/2023)  Utilities: Not At Risk (03/09/2023)  Tobacco Use: Low Risk  (03/09/2023)   SDOH Interventions:     Readmission Risk Interventions     No  data to display           
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
## 2023-03-11 NOTE — Discharge Summary (Signed)
Physician Discharge Summary   Patient: Mary Thornton MRN: [id number] DOB: 11-29-1940  Admit date:     03/08/2023  Discharge date: 03/10/2023  Discharge Physician: Delfino Lovett   PCP: Marguarite Arbour, MD   Recommendations at discharge:   Follow-up with outpatient providers as requested  Discharge Diagnoses: Principal Problem:   Anemia Active Problems:   Atrial fibrillation with RVR (HCC)   Essential hypertension   GERD without esophagitis   Coronary artery disease  Hospital Course: 82 y.o. female with past medical history of atrial fibrillation on reduced dose Eliquis, coronary disease hypertension hyperlipidemia MGUS, Barrett's esophagus admitted for anemia  5/9: Discontinued Cardizem drip and started on metoprolol.  Added Protonix and consulted palliative care  Assessment and Plan: * Anemia 2/2 bleeding and bruising/hematoma on left hip.  Eliquis is held.  H&H remained stable, hemoglobin of 8.7 on the day of discharge, S/p 1 PRBC transfusion while in the ED for hemoglobin of 6.8 Patient did have a fall prior to her hospitalization at [facility] and had significant hematoma. CT of the left lower extremity during this admission showed a large subcutaneous hematoma adjacent to the left gluteus musculature measuring 4 x 5 x 4 0.2 x 10.8 cm.  Patient denies melena or blood in her stool.  EDP performed rectal exam while in the ED and there was no obvious blood   Atrial fibrillation with RVR (HCC) Currently anticoagulation contraindicated due to severe anemia and large hematoma Rate is controlled on metoprolol.   Coronary artery disease GERD without esophagitis Essential hypertension  Patient was desperate to go home and remained stable so was discharged home in stable condition.  Family was in agreement  Eliquis to be stopped for now until outpatient evaluation by her PCP.  Patient and family are in agreement as she is very high risk for further bleed       Disposition: Home  with home health RN, PT, OT with palliative care to follow Diet recommendation:  Discharge Diet Orders (From admission, onward)     Start     Ordered   03/10/23 0000  Diet - low sodium heart healthy        03/10/23 0944           Carb modified diet DISCHARGE MEDICATION: Allergies as of 03/10/2023       Reactions   Ibuprofen Other (See Comments)   History of esophagogastritis and barretts esophagus No bc powder or like products   Chlorhexidine Rash   Cymbalta [duloxetine Hcl] Itching   Penicillins Itching, Rash   Has patient had a PCN reaction causing immediate rash, facial/tongue/throat swelling, SOB or lightheadedness with hypotension: No Has patient had a PCN reaction causing severe rash involving mucus membranes or skin necrosis: No Has patient had a PCN reaction that required hospitalization: No Has patient had a PCN reaction occurring within the last 10 years: No If all of the above answers are "NO", then may proceed with Cephalosporin use.   Sulfa Antibiotics Rash        Medication List     STOP taking these medications    apixaban 2.5 MG Tabs tablet Commonly known as: ELIQUIS   timolol 0.25 % ophthalmic solution Commonly known as: TIMOPTIC       TAKE these medications    acetaminophen 325 MG tablet Commonly known as: TYLENOL Take 650 mg by mouth every 6 (six) hours as needed.   atorvastatin 40 MG tablet Commonly known as: Lipitor Take 1 tablet (40 mg  total) by mouth daily.   baclofen 20 MG tablet Commonly known as: LIORESAL Take 20 mg by mouth 2 (two) times daily.   calcium-vitamin D 500-200 MG-UNIT tablet Commonly known as: OSCAL WITH D Take 1 tablet by mouth daily.   clorazepate 3.75 MG tablet Commonly known as: TRANXENE Take 3.75 mg by mouth at bedtime.   diclofenac Sodium 1 % Gel Commonly known as: VOLTAREN Apply 2 g topically 4 (four) times daily.   diltiazem 60 MG tablet Commonly known as: CARDIZEM Take 60 mg by mouth 2 (two)  times daily.   feeding supplement Liqd Take 237 mLs by mouth 2 (two) times daily between meals.   ferrous sulfate 325 (65 FE) MG tablet Take 325 mg by mouth daily with breakfast.   levETIRAcetam 500 MG tablet Commonly known as: KEPPRA Take 250 mg by mouth 2 (two) times daily.   loratadine 10 MG tablet Commonly known as: Claritin Take 1 tablet (10 mg total) by mouth daily.   losartan 25 MG tablet Commonly known as: COZAAR Take 2 tablets (50 mg total) by mouth 2 (two) times daily. What changed: how much to take   melatonin 5 MG Tabs Take 1 tablet (5 mg total) by mouth at bedtime as needed.   metoprolol tartrate 25 MG tablet Commonly known as: LOPRESSOR Take 0.5 tablets (12.5 mg total) by mouth 2 (two) times daily.   montelukast 10 MG tablet Commonly known as: SINGULAIR Take 10 mg by mouth daily.   multivitamin tablet Take 1 tablet by mouth daily at 3 pm.   pantoprazole 40 MG tablet Commonly known as: PROTONIX Take 40 mg by mouth daily before breakfast.   PRESERVISION AREDS PO Take 2 capsules by mouth 2 (two) times daily. Take one capsule in the morning and one in the evening        Follow-up Information     Marguarite Arbour, MD Follow up on 03/17/2023.   Specialty: Internal Medicine Why: [facility] Discharge. Go at 10:00am. You will see Dr. Judithann Sheen PA. Contact information: [address] [place] [address] [phone]         Lonell Face, MD. Schedule an appointment as soon as possible for a visit on 04/21/2023.   Specialty: Neurology Why: [facility] Discharge F/UP. You already have a appointment June 21 at 1:00pm.. Keep that as your follow up. Contact information: [address] [facility] [place] [address] [phone]                Discharge Exam: Filed Weights   03/08/23 1502 03/10/23 0331  Weight: 51.7 kg 49.53 kg   82 year old female lying in the bed comfortably without any acute  distress Lungs clear to auscultation bilaterally Heart regular rate and rhythm Abdomen soft, benign Neuro awake and alert, nonfocal Skin/musculoskeletal: She has a large area of bruising extending from the left buttock down to the posterior thigh and calf with some induration.  Compartment is soft.  This is likely hematoma from recent fall and being on Eliquis  Condition at discharge: fair  The results of significant diagnostics from this hospitalization (including imaging, microbiology, ancillary and laboratory) are listed below for reference.   Imaging Studies: US Venous Img Lower Unilateral Left (DVT)  Result Date: 03/08/2023 CLINICAL DATA:  Leg swelling EXAM: LEFT LOWER EXTREMITY VENOUS DOPPLER ULTRASOUND TECHNIQUE: Gray-scale sonography with compression, as well as color and duplex ultrasound, were performed to evaluate the deep venous system(s) from the level of the common  femoral vein through the popliteal and proximal calf veins. COMPARISON:  None Available. FINDINGS: VENOUS Normal compressibility of the common femoral, superficial femoral, and popliteal veins, as well as the visualized calf veins. Visualized portions of profunda femoral vein and great saphenous vein unremarkable. No filling defects to suggest DVT on grayscale or color Doppler imaging. Doppler waveforms show normal direction of venous flow, normal respiratory plasticity and response to augmentation. Limited views of the contralateral common femoral vein are unremarkable. OTHER Left lower extremity soft tissue edema. Atherosclerotic disease of the bilateral common femoral arteries. Benign-appearing prominent left groin lymph node measuring 2.1 x 0.8 x 1.4 cm. Limitations: none IMPRESSION: No evidence of left lower extremity DVT. Electronically Signed   By: Allegra Lai M.D.   On: 03/08/2023 13:41    Microbiology: Results for orders placed or performed during the hospital encounter of 07/08/22  Resp Panel by RT-PCR (Flu  A&B, Covid) Anterior Nasal Swab     Status: None   Collection Time: 07/08/22  4:37 PM   Specimen: Anterior Nasal Swab  Result Value Ref Range Status   SARS Coronavirus 2 by RT PCR NEGATIVE NEGATIVE Final    Comment: (NOTE) SARS-CoV-2 target nucleic acids are NOT DETECTED.  The SARS-CoV-2 RNA is generally detectable in upper respiratory specimens during the acute phase of infection. The lowest concentration of SARS-CoV-2 viral copies this assay can detect is 138 copies/mL. A negative result does not preclude SARS-Cov-2 infection and should not be used as the sole basis for treatment or other patient management decisions. A negative result may occur with  improper specimen collection/handling, submission of specimen other than nasopharyngeal swab, presence of viral mutation(s) within the areas targeted by this assay, and inadequate number of viral copies(<138 copies/mL). A negative result must be combined with clinical observations, patient history, and epidemiological information. The expected result is Negative.  Fact Sheet for Patients:  BloggerCourse.com  Fact Sheet for Healthcare Providers:  SeriousBroker.it  This test is no t yet approved or cleared by the [place] FDA and  has been authorized for detection and/or diagnosis of SARS-CoV-2 by FDA under an Emergency Use Authorization (EUA). This EUA will remain  in effect (meaning this test can be used) for the duration of the COVID-19 declaration under Section 564(b)(1) of the Act, 21 U.S.C.section 360bbb-3(b)(1), unless the authorization is terminated  or revoked sooner.       Influenza A by PCR NEGATIVE NEGATIVE Final   Influenza B by PCR NEGATIVE NEGATIVE Final    Comment: (NOTE) The Xpert Xpress SARS-CoV-2/FLU/RSV plus assay is intended as an aid in the diagnosis of influenza from Nasopharyngeal swab specimens and should not be used as a sole basis for treatment.  Nasal washings and aspirates are unacceptable for Xpert Xpress SARS-CoV-2/FLU/RSV testing.  Fact Sheet for Patients: BloggerCourse.com  Fact Sheet for Healthcare Providers: SeriousBroker.it  This test is not yet approved or cleared by the [place] FDA and has been authorized for detection and/or diagnosis of SARS-CoV-2 by FDA under an Emergency Use Authorization (EUA). This EUA will remain in effect (meaning this test can be used) for the duration of the COVID-19 declaration under Section 564(b)(1) of the Act, 21 U.S.C. section 360bbb-3(b)(1), unless the authorization is terminated or revoked.  Performed at [facility], [address] [place], [place] [address]   Blood Culture (routine x 2)     Status: None   Collection Time: 07/08/22  4:40 PM   Specimen: Right Antecubital; Blood  Result Value  Ref Range Status   Specimen Description RIGHT ANTECUBITAL  Final   Special Requests   Final    BOTTLES DRAWN AEROBIC AND ANAEROBIC Blood Culture results may not be optimal due to an inadequate volume of blood received in culture bottles   Culture   Final    NO GROWTH 5 DAYS Performed at [facility], [address] [place], [place] [address]    Report Status 07/13/2022 FINAL  Final  Blood Culture (routine x 2)     Status: None   Collection Time: 07/08/22  4:45 PM   Specimen: Right Antecubital; Blood  Result Value Ref Range Status   Specimen Description RIGHT ANTECUBITAL  Final   Special Requests   Final    BOTTLES DRAWN AEROBIC AND ANAEROBIC Blood Culture results may not be optimal due to an inadequate volume of blood received in culture bottles   Culture   Final    NO GROWTH 5 DAYS Performed at [facility], [address] [place], [place] [address]    Report Status 07/13/2022 FINAL  Final  Urine Culture     Status: Abnormal   Collection Time: 07/08/22  4:48 PM   Specimen: In/Out Cath Urine   Result Value Ref Range Status   Specimen Description   Final    IN/OUT CATH URINE Performed at [facility], [address], [place], [place] [address]    Special Requests   Final    NONE Performed at [facility], [address] [place], [place] [address]    Culture MULTIPLE SPECIES PRESENT, SUGGEST RECOLLECTION (A)  Final   Report Status 07/10/2022 FINAL  Final  Urine Culture     Status: None   Collection Time: 07/10/22 11:03 AM   Specimen: Urine, Clean Catch  Result Value Ref Range Status   Specimen Description   Final    URINE, CLEAN CATCH Performed at [facility], [address] [place], [place] [address]    Special Requests   Final    NONE Performed at [facility], [address], [place], [place] [address]    Culture   Final    NO GROWTH Performed at [facility], [address], [place], [place] [address]    Report Status 07/11/2022 FINAL  Final    Labs: CBC: Recent Labs  Lab 03/08/23 1506 03/09/23 0627 03/10/23 0719  WBC 9.6 8.7 9.7  NEUTROABS 6.3  --   --   HGB 6.8* 7.8* 8.7*  HCT 21.1* 24.7* 26.6*  MCV 98.1 99.2 97.1  PLT 267 244 301   Basic Metabolic Panel: Recent Labs  Lab 03/08/23 1506 03/10/23 0719  NA 138 138  K 3.8 3.8  CL 104 105  CO2 25 25  GLUCOSE 99 104*  BUN 15 16  CREATININE 0.56 0.56  CALCIUM 8.5* 8.4*   Liver Function Tests: No results for input(s): "AST", "ALT", "ALKPHOS", "BILITOT", "PROT", "ALBUMIN" in the last 168 hours. CBG: Recent Labs  Lab 03/09/23 2312 03/10/23 0329 03/10/23 0857  GLUCAP 104* 99 95    Discharge time spent: greater than 30 minutes.  Signed: Delfino Lovett, MD [facility] 03/11/2023

## 2023-03-13 DIAGNOSIS — H353221 Exudative age-related macular degeneration, left eye, with active choroidal neovascularization: Secondary | ICD-10-CM | POA: Diagnosis not present

## 2023-03-13 DIAGNOSIS — H353211 Exudative age-related macular degeneration, right eye, with active choroidal neovascularization: Secondary | ICD-10-CM | POA: Diagnosis not present

## 2023-03-14 DIAGNOSIS — H353211 Exudative age-related macular degeneration, right eye, with active choroidal neovascularization: Secondary | ICD-10-CM | POA: Diagnosis not present

## 2023-03-14 DIAGNOSIS — H353221 Exudative age-related macular degeneration, left eye, with active choroidal neovascularization: Secondary | ICD-10-CM | POA: Diagnosis not present

## 2023-03-15 DIAGNOSIS — E119 Type 2 diabetes mellitus without complications: Secondary | ICD-10-CM | POA: Diagnosis not present

## 2023-03-15 DIAGNOSIS — S7002XD Contusion of left hip, subsequent encounter: Secondary | ICD-10-CM | POA: Diagnosis not present

## 2023-03-15 DIAGNOSIS — I4891 Unspecified atrial fibrillation: Secondary | ICD-10-CM | POA: Diagnosis not present

## 2023-03-15 DIAGNOSIS — J45909 Unspecified asthma, uncomplicated: Secondary | ICD-10-CM | POA: Diagnosis not present

## 2023-03-15 DIAGNOSIS — S5002XD Contusion of left elbow, subsequent encounter: Secondary | ICD-10-CM | POA: Diagnosis not present

## 2023-03-15 DIAGNOSIS — I10 Essential (primary) hypertension: Secondary | ICD-10-CM | POA: Diagnosis not present

## 2023-03-15 DIAGNOSIS — G9340 Encephalopathy, unspecified: Secondary | ICD-10-CM | POA: Diagnosis not present

## 2023-03-15 DIAGNOSIS — I2510 Atherosclerotic heart disease of native coronary artery without angina pectoris: Secondary | ICD-10-CM | POA: Diagnosis not present

## 2023-03-15 DIAGNOSIS — S79922D Unspecified injury of left thigh, subsequent encounter: Secondary | ICD-10-CM | POA: Diagnosis not present

## 2023-03-16 DIAGNOSIS — S7002XD Contusion of left hip, subsequent encounter: Secondary | ICD-10-CM | POA: Diagnosis not present

## 2023-03-16 DIAGNOSIS — S5002XD Contusion of left elbow, subsequent encounter: Secondary | ICD-10-CM | POA: Diagnosis not present

## 2023-03-16 DIAGNOSIS — E119 Type 2 diabetes mellitus without complications: Secondary | ICD-10-CM | POA: Diagnosis not present

## 2023-03-16 DIAGNOSIS — G9340 Encephalopathy, unspecified: Secondary | ICD-10-CM | POA: Diagnosis not present

## 2023-03-16 DIAGNOSIS — S79922D Unspecified injury of left thigh, subsequent encounter: Secondary | ICD-10-CM | POA: Diagnosis not present

## 2023-03-16 DIAGNOSIS — I4891 Unspecified atrial fibrillation: Secondary | ICD-10-CM | POA: Diagnosis not present

## 2023-03-16 DIAGNOSIS — I2510 Atherosclerotic heart disease of native coronary artery without angina pectoris: Secondary | ICD-10-CM | POA: Diagnosis not present

## 2023-03-16 DIAGNOSIS — I10 Essential (primary) hypertension: Secondary | ICD-10-CM | POA: Diagnosis not present

## 2023-03-16 DIAGNOSIS — J45909 Unspecified asthma, uncomplicated: Secondary | ICD-10-CM | POA: Diagnosis not present

## 2023-03-17 DIAGNOSIS — Z09 Encounter for follow-up examination after completed treatment for conditions other than malignant neoplasm: Secondary | ICD-10-CM | POA: Diagnosis not present

## 2023-03-17 DIAGNOSIS — S8012XA Contusion of left lower leg, initial encounter: Secondary | ICD-10-CM | POA: Diagnosis not present

## 2023-03-17 DIAGNOSIS — I10 Essential (primary) hypertension: Secondary | ICD-10-CM | POA: Diagnosis not present

## 2023-03-17 DIAGNOSIS — I4891 Unspecified atrial fibrillation: Secondary | ICD-10-CM | POA: Diagnosis not present

## 2023-03-17 DIAGNOSIS — J449 Chronic obstructive pulmonary disease, unspecified: Secondary | ICD-10-CM | POA: Diagnosis not present

## 2023-03-20 DIAGNOSIS — I2510 Atherosclerotic heart disease of native coronary artery without angina pectoris: Secondary | ICD-10-CM | POA: Diagnosis not present

## 2023-03-20 DIAGNOSIS — E119 Type 2 diabetes mellitus without complications: Secondary | ICD-10-CM | POA: Diagnosis not present

## 2023-03-20 DIAGNOSIS — S5002XD Contusion of left elbow, subsequent encounter: Secondary | ICD-10-CM | POA: Diagnosis not present

## 2023-03-20 DIAGNOSIS — G9340 Encephalopathy, unspecified: Secondary | ICD-10-CM | POA: Diagnosis not present

## 2023-03-20 DIAGNOSIS — I10 Essential (primary) hypertension: Secondary | ICD-10-CM | POA: Diagnosis not present

## 2023-03-20 DIAGNOSIS — S79922D Unspecified injury of left thigh, subsequent encounter: Secondary | ICD-10-CM | POA: Diagnosis not present

## 2023-03-20 DIAGNOSIS — S7002XD Contusion of left hip, subsequent encounter: Secondary | ICD-10-CM | POA: Diagnosis not present

## 2023-03-22 DIAGNOSIS — J45909 Unspecified asthma, uncomplicated: Secondary | ICD-10-CM | POA: Diagnosis not present

## 2023-03-22 DIAGNOSIS — S7002XD Contusion of left hip, subsequent encounter: Secondary | ICD-10-CM | POA: Diagnosis not present

## 2023-03-22 DIAGNOSIS — S5002XD Contusion of left elbow, subsequent encounter: Secondary | ICD-10-CM | POA: Diagnosis not present

## 2023-03-22 DIAGNOSIS — I2510 Atherosclerotic heart disease of native coronary artery without angina pectoris: Secondary | ICD-10-CM | POA: Diagnosis not present

## 2023-03-22 DIAGNOSIS — E119 Type 2 diabetes mellitus without complications: Secondary | ICD-10-CM | POA: Diagnosis not present

## 2023-03-22 DIAGNOSIS — I4891 Unspecified atrial fibrillation: Secondary | ICD-10-CM | POA: Diagnosis not present

## 2023-03-22 DIAGNOSIS — G9340 Encephalopathy, unspecified: Secondary | ICD-10-CM | POA: Diagnosis not present

## 2023-03-22 DIAGNOSIS — I10 Essential (primary) hypertension: Secondary | ICD-10-CM | POA: Diagnosis not present

## 2023-03-22 DIAGNOSIS — S79922D Unspecified injury of left thigh, subsequent encounter: Secondary | ICD-10-CM | POA: Diagnosis not present

## 2023-03-23 ENCOUNTER — Telehealth: Payer: Self-pay

## 2023-03-23 DIAGNOSIS — E119 Type 2 diabetes mellitus without complications: Secondary | ICD-10-CM | POA: Diagnosis not present

## 2023-03-23 DIAGNOSIS — S79922D Unspecified injury of left thigh, subsequent encounter: Secondary | ICD-10-CM | POA: Diagnosis not present

## 2023-03-23 DIAGNOSIS — G9340 Encephalopathy, unspecified: Secondary | ICD-10-CM | POA: Diagnosis not present

## 2023-03-23 DIAGNOSIS — I2510 Atherosclerotic heart disease of native coronary artery without angina pectoris: Secondary | ICD-10-CM | POA: Diagnosis not present

## 2023-03-23 DIAGNOSIS — I4891 Unspecified atrial fibrillation: Secondary | ICD-10-CM | POA: Diagnosis not present

## 2023-03-23 DIAGNOSIS — I10 Essential (primary) hypertension: Secondary | ICD-10-CM | POA: Diagnosis not present

## 2023-03-23 DIAGNOSIS — J45909 Unspecified asthma, uncomplicated: Secondary | ICD-10-CM | POA: Diagnosis not present

## 2023-03-23 DIAGNOSIS — S5002XD Contusion of left elbow, subsequent encounter: Secondary | ICD-10-CM | POA: Diagnosis not present

## 2023-03-23 DIAGNOSIS — S7002XD Contusion of left hip, subsequent encounter: Secondary | ICD-10-CM | POA: Diagnosis not present

## 2023-03-23 NOTE — Telephone Encounter (Signed)
TC to patient to review and discuss new PC referral.  Patient declined PC services at this time and declined in home consultation- stating that he had enough in home support arranged.  Patient has PC contact information should she reconsider services.   Referral will be canceled/discharged.

## 2023-03-24 DIAGNOSIS — E119 Type 2 diabetes mellitus without complications: Secondary | ICD-10-CM | POA: Diagnosis not present

## 2023-03-24 DIAGNOSIS — I10 Essential (primary) hypertension: Secondary | ICD-10-CM | POA: Diagnosis not present

## 2023-03-24 DIAGNOSIS — G9340 Encephalopathy, unspecified: Secondary | ICD-10-CM | POA: Diagnosis not present

## 2023-03-24 DIAGNOSIS — I4891 Unspecified atrial fibrillation: Secondary | ICD-10-CM | POA: Diagnosis not present

## 2023-03-24 DIAGNOSIS — S7002XD Contusion of left hip, subsequent encounter: Secondary | ICD-10-CM | POA: Diagnosis not present

## 2023-03-24 DIAGNOSIS — J45909 Unspecified asthma, uncomplicated: Secondary | ICD-10-CM | POA: Diagnosis not present

## 2023-03-24 DIAGNOSIS — S5002XD Contusion of left elbow, subsequent encounter: Secondary | ICD-10-CM | POA: Diagnosis not present

## 2023-03-24 DIAGNOSIS — I2510 Atherosclerotic heart disease of native coronary artery without angina pectoris: Secondary | ICD-10-CM | POA: Diagnosis not present

## 2023-03-24 DIAGNOSIS — S79922D Unspecified injury of left thigh, subsequent encounter: Secondary | ICD-10-CM | POA: Diagnosis not present

## 2023-03-28 DIAGNOSIS — S7002XD Contusion of left hip, subsequent encounter: Secondary | ICD-10-CM | POA: Diagnosis not present

## 2023-03-28 DIAGNOSIS — S5002XD Contusion of left elbow, subsequent encounter: Secondary | ICD-10-CM | POA: Diagnosis not present

## 2023-03-28 DIAGNOSIS — I2510 Atherosclerotic heart disease of native coronary artery without angina pectoris: Secondary | ICD-10-CM | POA: Diagnosis not present

## 2023-03-28 DIAGNOSIS — E119 Type 2 diabetes mellitus without complications: Secondary | ICD-10-CM | POA: Diagnosis not present

## 2023-03-28 DIAGNOSIS — S79922D Unspecified injury of left thigh, subsequent encounter: Secondary | ICD-10-CM | POA: Diagnosis not present

## 2023-03-28 DIAGNOSIS — I4891 Unspecified atrial fibrillation: Secondary | ICD-10-CM | POA: Diagnosis not present

## 2023-03-28 DIAGNOSIS — G9340 Encephalopathy, unspecified: Secondary | ICD-10-CM | POA: Diagnosis not present

## 2023-03-28 DIAGNOSIS — J45909 Unspecified asthma, uncomplicated: Secondary | ICD-10-CM | POA: Diagnosis not present

## 2023-03-28 DIAGNOSIS — I10 Essential (primary) hypertension: Secondary | ICD-10-CM | POA: Diagnosis not present

## 2023-03-29 DIAGNOSIS — S79922D Unspecified injury of left thigh, subsequent encounter: Secondary | ICD-10-CM | POA: Diagnosis not present

## 2023-03-29 DIAGNOSIS — I2510 Atherosclerotic heart disease of native coronary artery without angina pectoris: Secondary | ICD-10-CM | POA: Diagnosis not present

## 2023-03-29 DIAGNOSIS — E119 Type 2 diabetes mellitus without complications: Secondary | ICD-10-CM | POA: Diagnosis not present

## 2023-03-29 DIAGNOSIS — J45909 Unspecified asthma, uncomplicated: Secondary | ICD-10-CM | POA: Diagnosis not present

## 2023-03-29 DIAGNOSIS — S5002XD Contusion of left elbow, subsequent encounter: Secondary | ICD-10-CM | POA: Diagnosis not present

## 2023-03-29 DIAGNOSIS — G9340 Encephalopathy, unspecified: Secondary | ICD-10-CM | POA: Diagnosis not present

## 2023-03-29 DIAGNOSIS — I10 Essential (primary) hypertension: Secondary | ICD-10-CM | POA: Diagnosis not present

## 2023-03-29 DIAGNOSIS — S7002XD Contusion of left hip, subsequent encounter: Secondary | ICD-10-CM | POA: Diagnosis not present

## 2023-03-29 DIAGNOSIS — I4891 Unspecified atrial fibrillation: Secondary | ICD-10-CM | POA: Diagnosis not present

## 2023-03-30 DIAGNOSIS — S79922D Unspecified injury of left thigh, subsequent encounter: Secondary | ICD-10-CM | POA: Diagnosis not present

## 2023-03-30 DIAGNOSIS — E119 Type 2 diabetes mellitus without complications: Secondary | ICD-10-CM | POA: Diagnosis not present

## 2023-03-30 DIAGNOSIS — I4891 Unspecified atrial fibrillation: Secondary | ICD-10-CM | POA: Diagnosis not present

## 2023-03-30 DIAGNOSIS — I2510 Atherosclerotic heart disease of native coronary artery without angina pectoris: Secondary | ICD-10-CM | POA: Diagnosis not present

## 2023-03-30 DIAGNOSIS — J45909 Unspecified asthma, uncomplicated: Secondary | ICD-10-CM | POA: Diagnosis not present

## 2023-03-30 DIAGNOSIS — S7002XD Contusion of left hip, subsequent encounter: Secondary | ICD-10-CM | POA: Diagnosis not present

## 2023-03-30 DIAGNOSIS — I10 Essential (primary) hypertension: Secondary | ICD-10-CM | POA: Diagnosis not present

## 2023-03-30 DIAGNOSIS — G9340 Encephalopathy, unspecified: Secondary | ICD-10-CM | POA: Diagnosis not present

## 2023-03-30 DIAGNOSIS — S5002XD Contusion of left elbow, subsequent encounter: Secondary | ICD-10-CM | POA: Diagnosis not present

## 2023-04-04 DIAGNOSIS — S7002XD Contusion of left hip, subsequent encounter: Secondary | ICD-10-CM | POA: Diagnosis not present

## 2023-04-04 DIAGNOSIS — I10 Essential (primary) hypertension: Secondary | ICD-10-CM | POA: Diagnosis not present

## 2023-04-04 DIAGNOSIS — J45909 Unspecified asthma, uncomplicated: Secondary | ICD-10-CM | POA: Diagnosis not present

## 2023-04-04 DIAGNOSIS — S79922D Unspecified injury of left thigh, subsequent encounter: Secondary | ICD-10-CM | POA: Diagnosis not present

## 2023-04-04 DIAGNOSIS — I4891 Unspecified atrial fibrillation: Secondary | ICD-10-CM | POA: Diagnosis not present

## 2023-04-04 DIAGNOSIS — E119 Type 2 diabetes mellitus without complications: Secondary | ICD-10-CM | POA: Diagnosis not present

## 2023-04-04 DIAGNOSIS — S5002XD Contusion of left elbow, subsequent encounter: Secondary | ICD-10-CM | POA: Diagnosis not present

## 2023-04-04 DIAGNOSIS — G9340 Encephalopathy, unspecified: Secondary | ICD-10-CM | POA: Diagnosis not present

## 2023-04-04 DIAGNOSIS — I2510 Atherosclerotic heart disease of native coronary artery without angina pectoris: Secondary | ICD-10-CM | POA: Diagnosis not present

## 2023-04-06 DIAGNOSIS — M19049 Primary osteoarthritis, unspecified hand: Secondary | ICD-10-CM | POA: Diagnosis not present

## 2023-04-06 DIAGNOSIS — E119 Type 2 diabetes mellitus without complications: Secondary | ICD-10-CM | POA: Diagnosis not present

## 2023-04-06 DIAGNOSIS — D649 Anemia, unspecified: Secondary | ICD-10-CM | POA: Diagnosis not present

## 2023-04-06 DIAGNOSIS — I2510 Atherosclerotic heart disease of native coronary artery without angina pectoris: Secondary | ICD-10-CM | POA: Diagnosis not present

## 2023-04-06 DIAGNOSIS — I4891 Unspecified atrial fibrillation: Secondary | ICD-10-CM | POA: Diagnosis not present

## 2023-04-06 DIAGNOSIS — I110 Hypertensive heart disease with heart failure: Secondary | ICD-10-CM | POA: Diagnosis not present

## 2023-04-06 DIAGNOSIS — I509 Heart failure, unspecified: Secondary | ICD-10-CM | POA: Diagnosis not present

## 2023-04-06 DIAGNOSIS — J45909 Unspecified asthma, uncomplicated: Secondary | ICD-10-CM | POA: Diagnosis not present

## 2023-04-06 DIAGNOSIS — G9340 Encephalopathy, unspecified: Secondary | ICD-10-CM | POA: Diagnosis not present

## 2023-04-11 DIAGNOSIS — I4891 Unspecified atrial fibrillation: Secondary | ICD-10-CM | POA: Diagnosis not present

## 2023-04-11 DIAGNOSIS — I110 Hypertensive heart disease with heart failure: Secondary | ICD-10-CM | POA: Diagnosis not present

## 2023-04-11 DIAGNOSIS — I509 Heart failure, unspecified: Secondary | ICD-10-CM | POA: Diagnosis not present

## 2023-04-11 DIAGNOSIS — G9340 Encephalopathy, unspecified: Secondary | ICD-10-CM | POA: Diagnosis not present

## 2023-04-11 DIAGNOSIS — E119 Type 2 diabetes mellitus without complications: Secondary | ICD-10-CM | POA: Diagnosis not present

## 2023-04-11 DIAGNOSIS — D649 Anemia, unspecified: Secondary | ICD-10-CM | POA: Diagnosis not present

## 2023-04-11 DIAGNOSIS — M19049 Primary osteoarthritis, unspecified hand: Secondary | ICD-10-CM | POA: Diagnosis not present

## 2023-04-11 DIAGNOSIS — J45909 Unspecified asthma, uncomplicated: Secondary | ICD-10-CM | POA: Diagnosis not present

## 2023-04-11 DIAGNOSIS — I2510 Atherosclerotic heart disease of native coronary artery without angina pectoris: Secondary | ICD-10-CM | POA: Diagnosis not present

## 2023-04-12 DIAGNOSIS — M19032 Primary osteoarthritis, left wrist: Secondary | ICD-10-CM | POA: Diagnosis not present

## 2023-04-13 DIAGNOSIS — I4891 Unspecified atrial fibrillation: Secondary | ICD-10-CM | POA: Diagnosis not present

## 2023-04-13 DIAGNOSIS — I110 Hypertensive heart disease with heart failure: Secondary | ICD-10-CM | POA: Diagnosis not present

## 2023-04-13 DIAGNOSIS — E119 Type 2 diabetes mellitus without complications: Secondary | ICD-10-CM | POA: Diagnosis not present

## 2023-04-13 DIAGNOSIS — I2510 Atherosclerotic heart disease of native coronary artery without angina pectoris: Secondary | ICD-10-CM | POA: Diagnosis not present

## 2023-04-13 DIAGNOSIS — D649 Anemia, unspecified: Secondary | ICD-10-CM | POA: Diagnosis not present

## 2023-04-13 DIAGNOSIS — G9340 Encephalopathy, unspecified: Secondary | ICD-10-CM | POA: Diagnosis not present

## 2023-04-13 DIAGNOSIS — J45909 Unspecified asthma, uncomplicated: Secondary | ICD-10-CM | POA: Diagnosis not present

## 2023-04-13 DIAGNOSIS — I509 Heart failure, unspecified: Secondary | ICD-10-CM | POA: Diagnosis not present

## 2023-04-13 DIAGNOSIS — M19049 Primary osteoarthritis, unspecified hand: Secondary | ICD-10-CM | POA: Diagnosis not present

## 2023-04-17 DIAGNOSIS — I4891 Unspecified atrial fibrillation: Secondary | ICD-10-CM | POA: Diagnosis not present

## 2023-04-17 DIAGNOSIS — S7002XA Contusion of left hip, initial encounter: Secondary | ICD-10-CM | POA: Diagnosis not present

## 2023-04-17 DIAGNOSIS — Z862 Personal history of diseases of the blood and blood-forming organs and certain disorders involving the immune mechanism: Secondary | ICD-10-CM | POA: Diagnosis not present

## 2023-04-20 DIAGNOSIS — J45909 Unspecified asthma, uncomplicated: Secondary | ICD-10-CM | POA: Diagnosis not present

## 2023-04-20 DIAGNOSIS — I110 Hypertensive heart disease with heart failure: Secondary | ICD-10-CM | POA: Diagnosis not present

## 2023-04-20 DIAGNOSIS — M19049 Primary osteoarthritis, unspecified hand: Secondary | ICD-10-CM | POA: Diagnosis not present

## 2023-04-20 DIAGNOSIS — D649 Anemia, unspecified: Secondary | ICD-10-CM | POA: Diagnosis not present

## 2023-04-20 DIAGNOSIS — I509 Heart failure, unspecified: Secondary | ICD-10-CM | POA: Diagnosis not present

## 2023-04-20 DIAGNOSIS — G9340 Encephalopathy, unspecified: Secondary | ICD-10-CM | POA: Diagnosis not present

## 2023-04-20 DIAGNOSIS — I4891 Unspecified atrial fibrillation: Secondary | ICD-10-CM | POA: Diagnosis not present

## 2023-04-20 DIAGNOSIS — E119 Type 2 diabetes mellitus without complications: Secondary | ICD-10-CM | POA: Diagnosis not present

## 2023-04-20 DIAGNOSIS — I2510 Atherosclerotic heart disease of native coronary artery without angina pectoris: Secondary | ICD-10-CM | POA: Diagnosis not present

## 2023-04-25 DIAGNOSIS — I2510 Atherosclerotic heart disease of native coronary artery without angina pectoris: Secondary | ICD-10-CM | POA: Diagnosis not present

## 2023-04-25 DIAGNOSIS — I509 Heart failure, unspecified: Secondary | ICD-10-CM | POA: Diagnosis not present

## 2023-04-25 DIAGNOSIS — G9340 Encephalopathy, unspecified: Secondary | ICD-10-CM | POA: Diagnosis not present

## 2023-04-25 DIAGNOSIS — I4891 Unspecified atrial fibrillation: Secondary | ICD-10-CM | POA: Diagnosis not present

## 2023-04-25 DIAGNOSIS — J45909 Unspecified asthma, uncomplicated: Secondary | ICD-10-CM | POA: Diagnosis not present

## 2023-04-25 DIAGNOSIS — D649 Anemia, unspecified: Secondary | ICD-10-CM | POA: Diagnosis not present

## 2023-04-25 DIAGNOSIS — M19049 Primary osteoarthritis, unspecified hand: Secondary | ICD-10-CM | POA: Diagnosis not present

## 2023-04-25 DIAGNOSIS — I110 Hypertensive heart disease with heart failure: Secondary | ICD-10-CM | POA: Diagnosis not present

## 2023-04-25 DIAGNOSIS — E119 Type 2 diabetes mellitus without complications: Secondary | ICD-10-CM | POA: Diagnosis not present

## 2023-04-27 DIAGNOSIS — I509 Heart failure, unspecified: Secondary | ICD-10-CM | POA: Diagnosis not present

## 2023-04-27 DIAGNOSIS — M19049 Primary osteoarthritis, unspecified hand: Secondary | ICD-10-CM | POA: Diagnosis not present

## 2023-04-27 DIAGNOSIS — E119 Type 2 diabetes mellitus without complications: Secondary | ICD-10-CM | POA: Diagnosis not present

## 2023-04-27 DIAGNOSIS — D649 Anemia, unspecified: Secondary | ICD-10-CM | POA: Diagnosis not present

## 2023-04-27 DIAGNOSIS — I4891 Unspecified atrial fibrillation: Secondary | ICD-10-CM | POA: Diagnosis not present

## 2023-04-27 DIAGNOSIS — I110 Hypertensive heart disease with heart failure: Secondary | ICD-10-CM | POA: Diagnosis not present

## 2023-04-27 DIAGNOSIS — I2510 Atherosclerotic heart disease of native coronary artery without angina pectoris: Secondary | ICD-10-CM | POA: Diagnosis not present

## 2023-04-27 DIAGNOSIS — J45909 Unspecified asthma, uncomplicated: Secondary | ICD-10-CM | POA: Diagnosis not present

## 2023-04-27 DIAGNOSIS — G9340 Encephalopathy, unspecified: Secondary | ICD-10-CM | POA: Diagnosis not present

## 2023-05-02 DIAGNOSIS — I509 Heart failure, unspecified: Secondary | ICD-10-CM | POA: Diagnosis not present

## 2023-05-02 DIAGNOSIS — E119 Type 2 diabetes mellitus without complications: Secondary | ICD-10-CM | POA: Diagnosis not present

## 2023-05-02 DIAGNOSIS — D649 Anemia, unspecified: Secondary | ICD-10-CM | POA: Diagnosis not present

## 2023-05-02 DIAGNOSIS — G9340 Encephalopathy, unspecified: Secondary | ICD-10-CM | POA: Diagnosis not present

## 2023-05-02 DIAGNOSIS — I2510 Atherosclerotic heart disease of native coronary artery without angina pectoris: Secondary | ICD-10-CM | POA: Diagnosis not present

## 2023-05-02 DIAGNOSIS — J45909 Unspecified asthma, uncomplicated: Secondary | ICD-10-CM | POA: Diagnosis not present

## 2023-05-02 DIAGNOSIS — I110 Hypertensive heart disease with heart failure: Secondary | ICD-10-CM | POA: Diagnosis not present

## 2023-05-02 DIAGNOSIS — I4891 Unspecified atrial fibrillation: Secondary | ICD-10-CM | POA: Diagnosis not present

## 2023-05-02 DIAGNOSIS — M19049 Primary osteoarthritis, unspecified hand: Secondary | ICD-10-CM | POA: Diagnosis not present

## 2023-05-08 DIAGNOSIS — I509 Heart failure, unspecified: Secondary | ICD-10-CM | POA: Diagnosis not present

## 2023-05-08 DIAGNOSIS — E119 Type 2 diabetes mellitus without complications: Secondary | ICD-10-CM | POA: Diagnosis not present

## 2023-05-08 DIAGNOSIS — I4891 Unspecified atrial fibrillation: Secondary | ICD-10-CM | POA: Diagnosis not present

## 2023-05-08 DIAGNOSIS — I2510 Atherosclerotic heart disease of native coronary artery without angina pectoris: Secondary | ICD-10-CM | POA: Diagnosis not present

## 2023-05-08 DIAGNOSIS — I110 Hypertensive heart disease with heart failure: Secondary | ICD-10-CM | POA: Diagnosis not present

## 2023-05-08 DIAGNOSIS — J45909 Unspecified asthma, uncomplicated: Secondary | ICD-10-CM | POA: Diagnosis not present

## 2023-05-08 DIAGNOSIS — G9340 Encephalopathy, unspecified: Secondary | ICD-10-CM | POA: Diagnosis not present

## 2023-05-12 DIAGNOSIS — I2510 Atherosclerotic heart disease of native coronary artery without angina pectoris: Secondary | ICD-10-CM | POA: Diagnosis not present

## 2023-05-12 DIAGNOSIS — M47816 Spondylosis without myelopathy or radiculopathy, lumbar region: Secondary | ICD-10-CM | POA: Diagnosis not present

## 2023-05-12 DIAGNOSIS — E782 Mixed hyperlipidemia: Secondary | ICD-10-CM | POA: Diagnosis not present

## 2023-05-12 DIAGNOSIS — R7303 Prediabetes: Secondary | ICD-10-CM | POA: Diagnosis not present

## 2023-05-12 DIAGNOSIS — R0602 Shortness of breath: Secondary | ICD-10-CM | POA: Diagnosis not present

## 2023-05-12 DIAGNOSIS — I10 Essential (primary) hypertension: Secondary | ICD-10-CM | POA: Diagnosis not present

## 2023-05-12 DIAGNOSIS — Z79899 Other long term (current) drug therapy: Secondary | ICD-10-CM | POA: Diagnosis not present

## 2023-05-12 DIAGNOSIS — I4891 Unspecified atrial fibrillation: Secondary | ICD-10-CM | POA: Diagnosis not present

## 2023-05-12 DIAGNOSIS — M533 Sacrococcygeal disorders, not elsewhere classified: Secondary | ICD-10-CM | POA: Diagnosis not present

## 2023-05-18 DIAGNOSIS — Z8679 Personal history of other diseases of the circulatory system: Secondary | ICD-10-CM | POA: Diagnosis not present

## 2023-05-18 DIAGNOSIS — R569 Unspecified convulsions: Secondary | ICD-10-CM | POA: Diagnosis not present

## 2023-05-18 DIAGNOSIS — T50905A Adverse effect of unspecified drugs, medicaments and biological substances, initial encounter: Secondary | ICD-10-CM | POA: Diagnosis not present

## 2023-05-18 DIAGNOSIS — R29818 Other symptoms and signs involving the nervous system: Secondary | ICD-10-CM | POA: Diagnosis not present

## 2023-05-18 DIAGNOSIS — R410 Disorientation, unspecified: Secondary | ICD-10-CM | POA: Diagnosis not present

## 2023-05-23 DIAGNOSIS — H353211 Exudative age-related macular degeneration, right eye, with active choroidal neovascularization: Secondary | ICD-10-CM | POA: Diagnosis not present

## 2023-05-29 DIAGNOSIS — H353221 Exudative age-related macular degeneration, left eye, with active choroidal neovascularization: Secondary | ICD-10-CM | POA: Diagnosis not present

## 2023-06-08 DIAGNOSIS — H401130 Primary open-angle glaucoma, bilateral, stage unspecified: Secondary | ICD-10-CM | POA: Diagnosis not present

## 2023-06-12 DIAGNOSIS — I700 Atherosclerosis of aorta: Secondary | ICD-10-CM | POA: Diagnosis not present

## 2023-06-12 DIAGNOSIS — R918 Other nonspecific abnormal finding of lung field: Secondary | ICD-10-CM | POA: Diagnosis not present

## 2023-06-12 DIAGNOSIS — R0602 Shortness of breath: Secondary | ICD-10-CM | POA: Diagnosis not present

## 2023-06-12 DIAGNOSIS — J42 Unspecified chronic bronchitis: Secondary | ICD-10-CM | POA: Diagnosis not present

## 2023-06-19 DIAGNOSIS — G8929 Other chronic pain: Secondary | ICD-10-CM | POA: Diagnosis not present

## 2023-06-19 DIAGNOSIS — M47817 Spondylosis without myelopathy or radiculopathy, lumbosacral region: Secondary | ICD-10-CM | POA: Diagnosis not present

## 2023-06-19 DIAGNOSIS — I4891 Unspecified atrial fibrillation: Secondary | ICD-10-CM | POA: Diagnosis not present

## 2023-06-19 DIAGNOSIS — M5116 Intervertebral disc disorders with radiculopathy, lumbar region: Secondary | ICD-10-CM | POA: Diagnosis not present

## 2023-06-19 DIAGNOSIS — M79605 Pain in left leg: Secondary | ICD-10-CM | POA: Diagnosis not present

## 2023-06-19 DIAGNOSIS — M545 Low back pain, unspecified: Secondary | ICD-10-CM | POA: Diagnosis not present

## 2023-06-19 DIAGNOSIS — M4185 Other forms of scoliosis, thoracolumbar region: Secondary | ICD-10-CM | POA: Diagnosis not present

## 2023-06-19 DIAGNOSIS — M47816 Spondylosis without myelopathy or radiculopathy, lumbar region: Secondary | ICD-10-CM | POA: Diagnosis not present

## 2023-06-19 DIAGNOSIS — M79604 Pain in right leg: Secondary | ICD-10-CM | POA: Diagnosis not present

## 2023-06-21 DIAGNOSIS — H2512 Age-related nuclear cataract, left eye: Secondary | ICD-10-CM | POA: Diagnosis not present

## 2023-06-21 DIAGNOSIS — H401121 Primary open-angle glaucoma, left eye, mild stage: Secondary | ICD-10-CM | POA: Diagnosis not present

## 2023-06-27 ENCOUNTER — Other Ambulatory Visit: Payer: Self-pay | Admitting: Internal Medicine

## 2023-06-27 DIAGNOSIS — E782 Mixed hyperlipidemia: Secondary | ICD-10-CM | POA: Diagnosis not present

## 2023-06-27 DIAGNOSIS — I471 Supraventricular tachycardia, unspecified: Secondary | ICD-10-CM | POA: Diagnosis not present

## 2023-06-27 DIAGNOSIS — R5381 Other malaise: Secondary | ICD-10-CM | POA: Diagnosis not present

## 2023-06-27 DIAGNOSIS — I25118 Atherosclerotic heart disease of native coronary artery with other forms of angina pectoris: Secondary | ICD-10-CM | POA: Diagnosis not present

## 2023-06-27 DIAGNOSIS — I700 Atherosclerosis of aorta: Secondary | ICD-10-CM | POA: Diagnosis not present

## 2023-06-27 DIAGNOSIS — I10 Essential (primary) hypertension: Secondary | ICD-10-CM | POA: Diagnosis not present

## 2023-06-27 DIAGNOSIS — I4891 Unspecified atrial fibrillation: Secondary | ICD-10-CM | POA: Diagnosis not present

## 2023-06-27 DIAGNOSIS — I2510 Atherosclerotic heart disease of native coronary artery without angina pectoris: Secondary | ICD-10-CM | POA: Diagnosis not present

## 2023-06-27 DIAGNOSIS — R001 Bradycardia, unspecified: Secondary | ICD-10-CM | POA: Diagnosis not present

## 2023-06-28 ENCOUNTER — Encounter: Payer: Self-pay | Admitting: Ophthalmology

## 2023-06-30 ENCOUNTER — Ambulatory Visit
Admission: RE | Admit: 2023-06-30 | Discharge: 2023-06-30 | Disposition: A | Discharge status: Home / Self Care | Payer: Self-pay | Source: Ambulatory Visit | Attending: Internal Medicine | Admitting: Internal Medicine

## 2023-06-30 DIAGNOSIS — E782 Mixed hyperlipidemia: Secondary | ICD-10-CM | POA: Insufficient documentation

## 2023-06-30 DIAGNOSIS — I2510 Atherosclerotic heart disease of native coronary artery without angina pectoris: Secondary | ICD-10-CM | POA: Diagnosis not present

## 2023-07-05 NOTE — Discharge Instructions (Signed)

## 2023-07-06 DIAGNOSIS — Z0100 Encounter for examination of eyes and vision without abnormal findings: Secondary | ICD-10-CM | POA: Diagnosis not present

## 2023-07-24 ENCOUNTER — Encounter
Admission: RE | Disposition: A | Discharge status: Home / Self Care | Payer: Self-pay | Source: Home / Self Care | Attending: Ophthalmology

## 2023-07-24 ENCOUNTER — Ambulatory Visit: Payer: Medicare HMO | Admitting: Anesthesiology

## 2023-07-24 ENCOUNTER — Encounter: Payer: Self-pay | Admitting: Ophthalmology

## 2023-07-24 ENCOUNTER — Other Ambulatory Visit: Payer: Self-pay

## 2023-07-24 ENCOUNTER — Ambulatory Visit
Admission: RE | Admit: 2023-07-24 | Discharge: 2023-07-24 | Disposition: A | Discharge status: Home / Self Care | Payer: Medicare HMO | Attending: Ophthalmology | Admitting: Ophthalmology

## 2023-07-24 DIAGNOSIS — Z90710 Acquired absence of both cervix and uterus: Secondary | ICD-10-CM | POA: Insufficient documentation

## 2023-07-24 DIAGNOSIS — H2512 Age-related nuclear cataract, left eye: Secondary | ICD-10-CM | POA: Insufficient documentation

## 2023-07-24 DIAGNOSIS — Z8249 Family history of ischemic heart disease and other diseases of the circulatory system: Secondary | ICD-10-CM | POA: Diagnosis not present

## 2023-07-24 DIAGNOSIS — I10 Essential (primary) hypertension: Secondary | ICD-10-CM | POA: Diagnosis not present

## 2023-07-24 DIAGNOSIS — K219 Gastro-esophageal reflux disease without esophagitis: Secondary | ICD-10-CM | POA: Diagnosis not present

## 2023-07-24 DIAGNOSIS — E785 Hyperlipidemia, unspecified: Secondary | ICD-10-CM | POA: Insufficient documentation

## 2023-07-24 DIAGNOSIS — Z96652 Presence of left artificial knee joint: Secondary | ICD-10-CM | POA: Diagnosis not present

## 2023-07-24 DIAGNOSIS — J45909 Unspecified asthma, uncomplicated: Secondary | ICD-10-CM | POA: Insufficient documentation

## 2023-07-24 DIAGNOSIS — H401121 Primary open-angle glaucoma, left eye, mild stage: Secondary | ICD-10-CM | POA: Diagnosis not present

## 2023-07-24 DIAGNOSIS — I2510 Atherosclerotic heart disease of native coronary artery without angina pectoris: Secondary | ICD-10-CM | POA: Insufficient documentation

## 2023-07-24 DIAGNOSIS — Z8719 Personal history of other diseases of the digestive system: Secondary | ICD-10-CM | POA: Insufficient documentation

## 2023-07-24 HISTORY — DX: Nonrheumatic mitral (valve) insufficiency: I34.0

## 2023-07-24 HISTORY — PX: CATARACT EXTRACTION W/PHACO: SHX586

## 2023-07-24 HISTORY — DX: Dyspnea, unspecified: R06.00

## 2023-07-24 SURGERY — PHACOEMULSIFICATION, CATARACT, WITH IOL INSERTION
Anesthesia: Monitor Anesthesia Care | Site: Eye | Laterality: Left

## 2023-07-24 MED ORDER — GLYCOPYRROLATE 0.2 MG/ML IJ SOLN
INTRAMUSCULAR | Status: AC
  Filled 2023-07-24: qty 1

## 2023-07-24 MED ORDER — GLYCOPYRROLATE 0.2 MG/ML IJ SOLN
INTRAMUSCULAR | Status: DC | PRN
  Administered 2023-07-24 (×2): .1 mg via INTRAVENOUS

## 2023-07-24 MED ORDER — TETRACAINE HCL 0.5 % OP SOLN
1.0000 [drp] | OPHTHALMIC | Status: DC | PRN
  Administered 2023-07-24 (×3): 1 [drp] via OPHTHALMIC

## 2023-07-24 MED ORDER — SIGHTPATH DOSE#1 BSS IO SOLN
INTRAOCULAR | Status: DC | PRN
  Administered 2023-07-24: 15 mL

## 2023-07-24 MED ORDER — FENTANYL CITRATE (PF) 100 MCG/2ML IJ SOLN
INTRAMUSCULAR | Status: DC | PRN
  Administered 2023-07-24: 50 ug via INTRAVENOUS

## 2023-07-24 MED ORDER — SIGHTPATH DOSE#1 BSS IO SOLN
INTRAOCULAR | Status: DC | PRN
  Administered 2023-07-24: 113 mL via OPHTHALMIC

## 2023-07-24 MED ORDER — TETRACAINE HCL 0.5 % OP SOLN
OPHTHALMIC | Status: AC
  Filled 2023-07-24: qty 4

## 2023-07-24 MED ORDER — SIGHTPATH DOSE#1 NA HYALUR & NA CHOND-NA HYALUR IO KIT
PACK | INTRAOCULAR | Status: DC | PRN
  Administered 2023-07-24: 1 via OPHTHALMIC

## 2023-07-24 MED ORDER — MIDAZOLAM HCL 2 MG/2ML IJ SOLN
INTRAMUSCULAR | Status: AC
  Filled 2023-07-24: qty 2

## 2023-07-24 MED ORDER — FENTANYL CITRATE (PF) 100 MCG/2ML IJ SOLN
INTRAMUSCULAR | Status: AC
  Filled 2023-07-24: qty 2

## 2023-07-24 MED ORDER — ARMC OPHTHALMIC DILATING DROPS
1.0000 | OPHTHALMIC | Status: DC | PRN
  Administered 2023-07-24 (×3): 1 via OPHTHALMIC

## 2023-07-24 MED ORDER — ONDANSETRON HCL 4 MG/2ML IJ SOLN
INTRAMUSCULAR | Status: DC | PRN
  Administered 2023-07-24: 4 mg via INTRAVENOUS

## 2023-07-24 MED ORDER — MOXIFLOXACIN HCL 0.5 % OP SOLN
OPHTHALMIC | Status: DC | PRN
  Administered 2023-07-24: .2 mL via OPHTHALMIC

## 2023-07-24 MED ORDER — LIDOCAINE HCL (PF) 2 % IJ SOLN
INTRAOCULAR | Status: DC | PRN
  Administered 2023-07-24: 1 mL via INTRAOCULAR

## 2023-07-24 MED ORDER — ONDANSETRON HCL 4 MG/2ML IJ SOLN
INTRAMUSCULAR | Status: AC
  Filled 2023-07-24: qty 2

## 2023-07-24 SURGICAL SUPPLY — 13 items
CATARACT SUITE SIGHTPATH (MISCELLANEOUS) ×1
DISSECTOR HYDRO NUCLEUS 50X22 (MISCELLANEOUS) ×1 IMPLANT
FEE CATARACT SUITE SIGHTPATH (MISCELLANEOUS) ×1 IMPLANT
GLOVE SURG GAMMEX PI TX LF 7.5 (GLOVE) ×1 IMPLANT
GLOVE SURG SYN 8.5 E (GLOVE) ×1
GLOVE SURG SYN 8.5 PF PI (GLOVE) ×1 IMPLANT
IVANTIS HYDRUS MICROSTENT (Stent) ×1 IMPLANT
LENS IOL TECNIS EYHANCE 19.5 (Intraocular Lens) IMPLANT
MICROSTENT IVANTIS HYDRUS (Stent) IMPLANT
NDL FILTER BLUNT 18X1 1/2 (NEEDLE) ×1 IMPLANT
NEEDLE FILTER BLUNT 18X1 1/2 (NEEDLE) ×1
SYR 3ML LL SCALE MARK (SYRINGE) ×1 IMPLANT
SYR 5ML LL (SYRINGE) ×1 IMPLANT

## 2023-07-24 NOTE — Anesthesia Preprocedure Evaluation (Signed)
Anesthesia Evaluation  Patient identified by MRN, date of birth, ID band Patient awake    Reviewed: Allergy & Precautions, H&P , NPO status , Patient's Chart, lab work & pertinent test results  History of Anesthesia Complications (+) PONV and history of anesthetic complications  Airway Mallampati: III  TM Distance: <3 FB Neck ROM: Full    Dental no notable dental hx. (+) Caps   Pulmonary shortness of breath, asthma  Asthma, seasonal allergies, sinusitis hx   Pulmonary exam normal breath sounds clear to auscultation       Cardiovascular hypertension, Pt. on home beta blockers and Pt. on medications + CAD  Normal cardiovascular exam+ dysrhythmias Supra Ventricular Tachycardia + Valvular Problems/Murmurs MR  Rhythm:Regular Rate:Normal  CAD, SVT, HTN 07-11-22 1. Left ventricular ejection fraction, by estimation, is 65 to 70%. The  left ventricle has normal function. The left ventricle has no regional  wall motion abnormalities. Left ventricular diastolic parameters were  normal.   2. Right ventricular systolic function is normal. The right ventricular  size is normal.   3. The mitral valve is normal in structure. Mild mitral valve  regurgitation. No evidence of mitral stenosis.   4. The aortic valve is normal in structure. Aortic valve regurgitation is  not visualized. No aortic stenosis is present.   5. The inferior vena cava is normal in size with greater than 50%  respiratory variability, suggesting right atrial pressure of 3 mmHg.       Neuro/Psych  Neuromuscular disease  negative psych ROS   GI/Hepatic Neg liver ROS,GERD  ,,Barretts esophagus   Endo/Other  negative endocrine ROS    Renal/GU Renal disease  negative genitourinary   Musculoskeletal negative musculoskeletal ROS (+) Arthritis ,    Abdominal   Peds negative pediatric ROS (+)  Hematology  (+) Blood dyscrasia, anemia   Anesthesia Other  Findings Coronary artery disease Hypertension Arthritis  Hyperlipemia Reactive airway disease Environmental allergies Recurrent sinusitis  Fibrocystic breast disease PSVT (paroxysmal supraventricular tachycardia)  Asthma Mild mitral regurg Barrett esophagus  Diverticulosis Hyperlipidemia  GERD (gastroesophageal reflux disease) Reflux esophagitis  Complication of anesthesia PONV (postoperative nausea and vomiting)  Dysrhythmia Seasonal allergies  Torticollis Torticollis  Neuropathy of foot Macular degeneration  Glaucoma Macular degeneration  Dyspnea    Reproductive/Obstetrics negative OB ROS                              Anesthesia Physical Anesthesia Plan  ASA: 3  Anesthesia Plan: MAC   Post-op Pain Management:    Induction: Intravenous  PONV Risk Score and Plan:   Airway Management Planned: Natural Airway and Nasal Cannula  Additional Equipment:   Intra-op Plan:   Post-operative Plan:   Informed Consent: I have reviewed the patients History and Physical, chart, labs and discussed the procedure including the risks, benefits and alternatives for the proposed anesthesia with the patient or authorized representative who has indicated his/her understanding and acceptance.     Dental Advisory Given  Plan Discussed with: Anesthesiologist, CRNA and Surgeon  Anesthesia Plan Comments: (Patient consented for risks of anesthesia including but not limited to:  - adverse reactions to medications - damage to eyes, teeth, lips or other oral mucosa - nerve damage due to positioning  - sore throat or hoarseness - Damage to heart, brain, nerves, lungs, other parts of body or loss of life  Patient voiced understanding.)         Anesthesia Quick Evaluation

## 2023-07-24 NOTE — H&P (Signed)
[facility]   Primary Care Physician:  Marguarite Arbour, MD Ophthalmologist: Dr. Willey Blade  Pre-Procedure History & Physical: HPI:  Russie Pinho is a 82 y.o. female here for cataract surgery.   Past Medical History:  Diagnosis Date   Arthritis    Asthma    Barrett esophagus    Complication of anesthesia    very hard to wake up. feels like she had too much medicine. 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 Elmer Picker   COLONOSCOPY WITH PROPOFOL N/A 06/23/2015   Procedure: COLONOSCOPY WITH PROPOFOL;  Surgeon: Christena Deem, MD;  Location: [facility] ENDOSCOPY;  Service: Endoscopy;  Laterality: N/A;   COLONOSCOPY WITH PROPOFOL N/A 08/11/2020   Procedure: COLONOSCOPY WITH PROPOFOL;  Surgeon: Regis Bill, MD;  Location: [facility] ENDOSCOPY;  Service: Endoscopy;  Laterality: N/A;   ESOPHAGOGASTRODUODENOSCOPY (EGD) WITH PROPOFOL N/A 06/23/2015   Procedure: ESOPHAGOGASTRODUODENOSCOPY (EGD) WITH PROPOFOL;  Surgeon: Christena Deem, MD;  Location: [facility] ENDOSCOPY;  Service: Endoscopy;  Laterality: N/A;   ESOPHAGOGASTRODUODENOSCOPY (EGD) WITH PROPOFOL N/A 11/28/2017   Procedure: ESOPHAGOGASTRODUODENOSCOPY  (EGD) WITH PROPOFOL;  Surgeon: Christena Deem, MD;  Location: [facility] ENDOSCOPY;  Service: Endoscopy;  Laterality: N/A;   ESOPHAGOGASTRODUODENOSCOPY (EGD) WITH PROPOFOL N/A 01/18/2018   Procedure: ESOPHAGOGASTRODUODENOSCOPY (EGD) WITH PROPOFOL;  Surgeon: Christena Deem, MD;  Location: [facility] ENDOSCOPY;  Service: Endoscopy;  Laterality: N/A;   ESOPHAGOGASTRODUODENOSCOPY (EGD) WITH PROPOFOL N/A 08/11/2020   Procedure: ESOPHAGOGASTRODUODENOSCOPY (EGD) WITH PROPOFOL;  Surgeon: Regis Bill, MD;  Location: [facility] ENDOSCOPY;  Service: Endoscopy;  Laterality: N/A;   JOINT REPLACEMENT Left 2009   total knee replacement   KNEE ARTHROSCOPY     LUMBAR LAMINECTOMY/DECOMPRESSION MICRODISCECTOMY N/A 08/22/2018   Procedure: LUMBAR LAMINECTOMY/DECOMPRESSION MICRODISCECTOMY 1 LEVEL-L2-5;  Surgeon: Venetia Night, MD;  Location: [facility] ORS;  Service: Neurosurgery;  Laterality: N/A;   Torticollis surgery x 2      Prior to Admission medications   Medication Sig Start Date End Date Taking? Authorizing Provider  acetaminophen (TYLENOL) 325 MG tablet Take 650 mg by mouth every 6 (six) hours as needed.   Yes [provider]  apixaban (ELIQUIS) 2.5 MG TABS tablet Take 2.5 mg by mouth 2 (two) times daily.   Yes [provider]  atorvastatin (LIPITOR) 40 MG tablet Take 1 tablet (40 mg total) by mouth daily. 10/31/17 06/28/23 Yes Altamese Dilling, MD  calcium-vitamin D (OSCAL WITH D) 500-200 MG-UNIT tablet Take 1 tablet by mouth daily.   Yes [provider]  clorazepate (TRANXENE) 3.75 MG tablet Take 3.75 mg by  mouth at bedtime. 12/06/22  Yes [provider]  diltiazem (CARDIZEM) 60 MG tablet Take 60 mg by mouth 2 (two) times daily. 07/26/22  Yes [provider]  feeding supplement (ENSURE ENLIVE / ENSURE PLUS) LIQD Take 237 mLs by mouth 2 (two) times daily between meals. 07/13/22  Yes Hollice Espy, MD  ferrous sulfate 325 (65 FE) MG tablet Take 325 mg by mouth  daily with breakfast.   Yes [provider]  levETIRAcetam (KEPPRA) 500 MG tablet Take 250 mg by mouth 2 (two) times daily. 02/10/23 02/10/24 Yes [provider]  loratadine (CLARITIN) 10 MG tablet Take 1 tablet (10 mg total) by mouth daily. 05/30/18 06/28/23 Yes Willy Eddy, MD  losartan (COZAAR) 25 MG tablet Take 2 tablets (50 mg total) by mouth 2 (two) times daily. 07/13/22  Yes Hollice Espy, MD  melatonin 5 MG TABS Take 1 tablet (5 mg total) by mouth at bedtime as needed. 07/13/22  Yes Hollice Espy, MD  montelukast (SINGULAIR) 10 MG tablet Take 10 mg by mouth daily.    Yes [provider]  Multiple Vitamin (MULTIVITAMIN) tablet Take 1 tablet by mouth daily at 3 pm.    Yes [provider]  Multiple Vitamins-Minerals (PRESERVISION AREDS PO) Take 2 capsules by mouth 2 (two) times daily. Take one capsule in the morning and one in the evening   Yes [provider]  pantoprazole (PROTONIX) 40 MG tablet Take 40 mg by mouth 2 (two) times daily. 09/25/17  Yes [provider]  baclofen (LIORESAL) 20 MG tablet Take 20 mg by mouth 2 (two) times daily. Patient not taking: Reported on 06/28/2023 05/20/22   [provider]  diclofenac Sodium (VOLTAREN) 1 % GEL Apply 2 g topically 4 (four) times daily. Patient not taking: Reported on 06/28/2023 03/06/23 03/05/24  [provider]  metoprolol tartrate (LOPRESSOR) 25 MG tablet Take 0.5 tablets (12.5 mg total) by mouth 2 (two) times daily. Patient not taking: Reported on 06/28/2023 07/13/22   Hollice Espy, MD    Allergies as of 06/09/2023 - Review Complete 03/08/2023  Allergen Reaction Noted   Ibuprofen Other (See Comments) 08/14/2018   Chlorhexidine Rash 08/22/2018   Cymbalta [duloxetine hcl] Itching 08/10/2020   Penicillins Itching and Rash 06/22/2015   Sulfa antibiotics Rash 06/22/2015    Family History  Problem Relation Age of Onset   Heart disease Mother    Heart  disease Sister    Cancer Neg Hx    Diabetes Neg Hx    Breast cancer Neg Hx    Amblyopia Neg Hx    Blindness Neg Hx    Cataracts Neg Hx    Glaucoma Neg Hx    Macular degeneration Neg Hx    Retinal detachment Neg Hx    Strabismus Neg Hx    Retinitis pigmentosa Neg Hx     Social History   Socioeconomic History   Marital status: Widowed    Spouse name: Not on file   Number of children: Not on file   Years of education: Not on file   Highest education level: Not on file  Occupational History   Occupation: [employer]    Comment: retired  Tobacco Use   Smoking status: Never   Smokeless tobacco: Never  Vaping Use   Vaping status: Never Used  Substance and Sexual Activity   Alcohol use: Never   Drug use: Never   Sexual activity: Not Currently    Birth control/protection: Surgical  Other Topics Concern   Not on file  Social History Narrative   Not on file   Social Determinants of Health   Financial Resource Strain: Low Risk  (03/05/2023)   [facility] from [facility]   Overall Financial Resource Strain (CARDIA)    Difficulty of Paying Living Expenses: Not hard at all  Food Insecurity: No Food Insecurity (03/09/2023)   Hunger Vital Sign    Worried About Running Out of Food in the Last Year: Never true    Ran Out of Food in the Last Year: Never true  Transportation Needs: No Transportation Needs (03/09/2023)   PRAPARE - Administrator, Civil Service (Medical): No    Lack of Transportation (Non-Medical): No  Physical Activity: Not on file  Stress: Not on file  Social Connections: Not on file  Intimate Partner Violence: Not At Risk (03/09/2023)   Humiliation, Afraid, Rape, and Kick questionnaire    Fear of Current or Ex-Partner: No    Emotionally Abused: No    Physically Abused: No    Sexually Abused: No    Review of Systems: See HPI, otherwise negative ROS  Physical Exam: BP 128/67   Pulse 67   Temp (!) 97.5 F (36.4 C) (Temporal)    Resp 19   Ht 5\' 3"  (1.6 m)   Wt 50.3 kg   SpO2 96%   BMI 19.66 kg/m  General:   Alert, cooperative in NAD Head:  Normocephalic and atraumatic. Respiratory:  Normal work of breathing. Cardiovascular:  RRR  Impression/Plan: Gilda Udell is here for cataract surgery.  Risks, benefits, limitations, and alternatives regarding cataract surgery have been reviewed with the patient.  Questions have been answered.  All parties agreeable.   Willey Blade, MD  07/24/2023, 11:04 AM

## 2023-07-24 NOTE — Anesthesia Postprocedure Evaluation (Signed)
Anesthesia Post Note  Patient: Olee Stoddart  Procedure(s) Performed: CATARACT EXTRACTION PHACO AND INTRAOCULAR LENS PLACEMENT (IOC) LEFT HYDRUS MICROSTENT  6.84  00:38.6 (Left: Eye)  Patient location during evaluation: PACU Anesthesia Type: MAC Level of consciousness: awake and alert Pain management: pain level controlled Vital Signs Assessment: post-procedure vital signs reviewed and stable Respiratory status: spontaneous breathing, nonlabored ventilation, respiratory function stable and patient connected to nasal cannula oxygen Cardiovascular status: stable and blood pressure returned to baseline Postop Assessment: no apparent nausea or vomiting Anesthetic complications: no   No notable events documented.   Last Vitals:  Vitals:   07/24/23 1146 07/24/23 1151  BP: 115/77 125/82  Pulse: (!) 57 64  Resp: (!) 21 (!) 21  Temp: (!) 36.3 C   SpO2: 100% 100%    Last Pain:  Vitals:   07/24/23 1146  TempSrc:   PainSc: 0-No pain                 Jennefer Kopp C Romulo Okray

## 2023-07-24 NOTE — Transfer of Care (Signed)
Immediate Anesthesia Transfer of Care Note  Patient: Mary Thornton  Procedure(s) Performed: CATARACT EXTRACTION PHACO AND INTRAOCULAR LENS PLACEMENT (IOC) LEFT HYDRUS MICROSTENT  6.84  00:38.6 (Left: Eye)  Patient Location: PACU  Anesthesia Type: MAC  Level of Consciousness: awake, alert  and patient cooperative  Airway and Oxygen Therapy: Patient Spontanous Breathing and Patient connected to supplemental oxygen  Post-op Assessment: Post-op Vital signs reviewed, Patient's Cardiovascular Status Stable, Respiratory Function Stable, Patent Airway and No signs of Nausea or vomiting  Post-op Vital Signs: Reviewed and stable  Complications: No notable events documented.

## 2023-07-24 NOTE — Op Note (Signed)
OPERATIVE NOTE  Mary Thornton [id number] 07/24/2023  PREOPERATIVE DIAGNOSIS:   1.  Mild  PRIMARY open angle glaucoma, left eye. N82.9562  2.  Nuclear sclerotic cataract left eye.  H25.12   POSTOPERATIVE DIAGNOSIS:    same.   PROCEDURE:   1.  Placement of trabecular bypass stent (hydrus) and phacoemusification with posterior chamber intraocular lens placement of the left eye  CPT [phone]   LENS: Implant Name Type Inv. Item Serial No. Manufacturer Lot No. LRB No. Used Action  LENS IOL TECNIS EYHANCE 19.5 - [id number] Intraocular Lens LENS IOL TECNIS EYHANCE 19.5 [id number] [facility]  Left 1 Implanted  IVANTIS HYDRUS MICROSTENT - [id number] Stent Will Bonnet MICROSTENT  [facility] [id number] Left 1 Implanted      Procedure(s): CATARACT EXTRACTION PHACO AND INTRAOCULAR LENS PLACEMENT (IOC) LEFT HYDRUS MICROSTENT  6.84  00:38.6 (Left)   SURGEON:  Willey Blade, MD, MPH  ANESTHESIOLOGIST: Anesthesiologist: Marisue Humble, MD CRNA: Barbette Hair, CRNA   ANESTHESIA:  MAC  and intracameral preservative-free intracameral lidocaine 4%.  ESTIMATED BLOOD LOSS: less than 1 mL.   COMPLICATIONS:  None.   DESCRIPTION OF PROCEDURE:  The patient was identified in the holding room and transported to the operating room.  The patient was placed in the supine position under the operating microscope.  The left eye was prepped and draped in the usual sterile ophthalmic fashion.   A 1.0 millimeter clear-corneal paracentesis was made at the 4:30 position, and a second paracentesis was made at 1:30 for the hydrus. 0.5 ml of preservative-free 1% lidocaine with epinephrine was injected into the anterior chamber.  The anterior chamber was filled with Healon 5 viscoelastic.  A 2.4 millimeter keratome was used to make a near-clear corneal incision at the 2:00 position.   Attention was turned to the hydrus stent.  The patients head was turned to the left and the microscope was tilted to 035  degrees.  Ocular instruments/Glaukos OAL/H2 gonioprism was used with Healon 5 on the cornea to visualize the trabecular meshwork. The hydrus was introduced into the eye and the  meshwork was engaged with the tip of the and the stent was deployed into Schlemm's canal.  The stent was well seated and in good position.  Next, attention was turned to the phacoemulsification A curvilinear capsulorrhexis was made with a cystotome and capsulorrhexis forceps.  Balanced salt solution was used to hydrodissect and hydrodelineate the nucleus.   Phacoemulsification was then used in stop and chop fashion to remove the lens nucleus and epinucleus.  The remaining cortex was then removed using the irrigation and aspiration handpiece. Healon was then placed into the capsular bag to distend it for lens placement.  A lens was then injected into the capsular bag.  The remaining viscoelastic was aspirated.   Wounds were hydrated with balanced salt solution.  The anterior chamber was inflated to a physiologic pressure with balanced salt solution.   Intracameral vigamox 0.1 mL undiluted was injected into the eye and a drop placed onto the ocular surface.  No wound leaks were noted.  Protective glasses were placed on the patient.  The patient was taken to the recovery room in stable condition without complications of anesthesia or surgery   Willey Blade 07/24/2023, 11:44 AM

## 2023-08-03 DIAGNOSIS — I700 Atherosclerosis of aorta: Secondary | ICD-10-CM | POA: Diagnosis not present

## 2023-08-03 DIAGNOSIS — I471 Supraventricular tachycardia, unspecified: Secondary | ICD-10-CM | POA: Diagnosis not present

## 2023-08-03 DIAGNOSIS — I2510 Atherosclerotic heart disease of native coronary artery without angina pectoris: Secondary | ICD-10-CM | POA: Diagnosis not present

## 2023-08-03 DIAGNOSIS — E782 Mixed hyperlipidemia: Secondary | ICD-10-CM | POA: Diagnosis not present

## 2023-08-03 DIAGNOSIS — R001 Bradycardia, unspecified: Secondary | ICD-10-CM | POA: Diagnosis not present

## 2023-08-03 DIAGNOSIS — R0602 Shortness of breath: Secondary | ICD-10-CM | POA: Diagnosis not present

## 2023-08-03 DIAGNOSIS — I10 Essential (primary) hypertension: Secondary | ICD-10-CM | POA: Diagnosis not present

## 2023-08-03 DIAGNOSIS — I25118 Atherosclerotic heart disease of native coronary artery with other forms of angina pectoris: Secondary | ICD-10-CM | POA: Diagnosis not present

## 2023-08-03 DIAGNOSIS — I4891 Unspecified atrial fibrillation: Secondary | ICD-10-CM | POA: Diagnosis not present

## 2023-08-07 DIAGNOSIS — H353221 Exudative age-related macular degeneration, left eye, with active choroidal neovascularization: Secondary | ICD-10-CM | POA: Diagnosis not present

## 2023-08-08 ENCOUNTER — Other Ambulatory Visit: Payer: Self-pay | Admitting: Internal Medicine

## 2023-08-08 DIAGNOSIS — H353211 Exudative age-related macular degeneration, right eye, with active choroidal neovascularization: Secondary | ICD-10-CM | POA: Diagnosis not present

## 2023-08-08 DIAGNOSIS — R0602 Shortness of breath: Secondary | ICD-10-CM

## 2023-08-10 ENCOUNTER — Ambulatory Visit
Admission: RE | Admit: 2023-08-10 | Discharge: 2023-08-10 | Disposition: A | Discharge status: Home / Self Care | Payer: Medicare HMO | Source: Ambulatory Visit | Attending: Internal Medicine | Admitting: Internal Medicine

## 2023-08-10 DIAGNOSIS — I480 Paroxysmal atrial fibrillation: Secondary | ICD-10-CM | POA: Insufficient documentation

## 2023-08-10 DIAGNOSIS — I25118 Atherosclerotic heart disease of native coronary artery with other forms of angina pectoris: Secondary | ICD-10-CM | POA: Insufficient documentation

## 2023-08-10 DIAGNOSIS — R0602 Shortness of breath: Secondary | ICD-10-CM | POA: Insufficient documentation

## 2023-08-10 DIAGNOSIS — R9430 Abnormal result of cardiovascular function study, unspecified: Secondary | ICD-10-CM | POA: Diagnosis not present

## 2023-08-10 MED ORDER — IOHEXOL 350 MG/ML SOLN
75.0000 mL | Freq: Once | INTRAVENOUS | Status: AC | PRN
  Administered 2023-08-10: 75 mL via INTRAVENOUS

## 2023-08-10 MED ORDER — SODIUM CHLORIDE 0.9 % IV SOLN: INTRAVENOUS | Status: DC

## 2023-08-10 MED ORDER — NITROGLYCERIN 0.4 MG SL SUBL
0.8000 mg | SUBLINGUAL_TABLET | Freq: Once | SUBLINGUAL | Status: AC
  Administered 2023-08-10: 0.8 mg via SUBLINGUAL

## 2023-08-10 NOTE — Progress Notes (Signed)
Pt tolerated procedure well with no issues. Pt ABCs intact. Pt denies any complaints. Pt encouraged to drink plenty of water throughout the day. Pt ambulatory with steady gait.

## 2023-08-11 ENCOUNTER — Inpatient Hospital Stay: Payer: Medicare HMO | Attending: Oncology

## 2023-08-11 DIAGNOSIS — Z79899 Other long term (current) drug therapy: Secondary | ICD-10-CM | POA: Insufficient documentation

## 2023-08-11 DIAGNOSIS — D472 Monoclonal gammopathy: Secondary | ICD-10-CM | POA: Diagnosis not present

## 2023-08-11 LAB — CBC WITH DIFFERENTIAL/PLATELET
Abs Immature Granulocytes: 0.01 10*3/uL (ref 0.00–0.07)
Basophils Absolute: 0 10*3/uL (ref 0.0–0.1)
Basophils Relative: 1 %
Eosinophils Absolute: 0.2 10*3/uL (ref 0.0–0.5)
Eosinophils Relative: 4 %
HCT: 34.5 % — ABNORMAL LOW (ref 36.0–46.0)
Hemoglobin: 11.2 g/dL — ABNORMAL LOW (ref 12.0–15.0)
Immature Granulocytes: 0 %
Lymphocytes Relative: 24 %
Lymphs Abs: 1.3 10*3/uL (ref 0.7–4.0)
MCH: 31.5 pg (ref 26.0–34.0)
MCHC: 32.5 g/dL (ref 30.0–36.0)
MCV: 97.2 fL (ref 80.0–100.0)
Monocytes Absolute: 0.6 10*3/uL (ref 0.1–1.0)
Monocytes Relative: 12 %
Neutro Abs: 3.1 10*3/uL (ref 1.7–7.7)
Neutrophils Relative %: 59 %
Platelets: 251 10*3/uL (ref 150–400)
RBC: 3.55 MIL/uL — ABNORMAL LOW (ref 3.87–5.11)
RDW: 12.8 % (ref 11.5–15.5)
WBC: 5.2 10*3/uL (ref 4.0–10.5)
nRBC: 0 % (ref 0.0–0.2)

## 2023-08-11 LAB — COMPREHENSIVE METABOLIC PANEL
ALT: 21 U/L (ref 0–44)
AST: 30 U/L (ref 15–41)
Albumin: 4.4 g/dL (ref 3.5–5.0)
Alkaline Phosphatase: 81 U/L (ref 38–126)
Anion gap: 7 (ref 5–15)
BUN: 14 mg/dL (ref 8–23)
CO2: 26 mmol/L (ref 22–32)
Calcium: 8.7 mg/dL — ABNORMAL LOW (ref 8.9–10.3)
Chloride: 103 mmol/L (ref 98–111)
Creatinine, Ser: 0.59 mg/dL (ref 0.44–1.00)
GFR, Estimated: >60 mL/min (ref >60)
Glucose, Bld: 90 mg/dL (ref 70–99)
Potassium: 4 mmol/L (ref 3.5–5.1)
Sodium: 136 mmol/L (ref 135–145)
Total Bilirubin: 0.8 mg/dL (ref 0.3–1.2)
Total Protein: 6.8 g/dL (ref 6.5–8.1)

## 2023-08-13 LAB — KAPPA/LAMBDA LIGHT CHAINS
Kappa free light chain: 69.7 mg/L — ABNORMAL HIGH (ref 3.3–19.4)
Kappa, lambda light chain ratio: 5.28 — ABNORMAL HIGH (ref 0.26–1.65)
Lambda free light chains: 13.2 mg/L (ref 5.7–26.3)

## 2023-08-14 ENCOUNTER — Other Ambulatory Visit: Payer: Self-pay | Admitting: Internal Medicine

## 2023-08-14 ENCOUNTER — Other Ambulatory Visit: Payer: Self-pay

## 2023-08-14 DIAGNOSIS — K219 Gastro-esophageal reflux disease without esophagitis: Secondary | ICD-10-CM | POA: Diagnosis not present

## 2023-08-14 DIAGNOSIS — Z1231 Encounter for screening mammogram for malignant neoplasm of breast: Secondary | ICD-10-CM

## 2023-08-14 DIAGNOSIS — D472 Monoclonal gammopathy: Secondary | ICD-10-CM

## 2023-08-14 DIAGNOSIS — I10 Essential (primary) hypertension: Secondary | ICD-10-CM | POA: Diagnosis not present

## 2023-08-14 DIAGNOSIS — Z79899 Other long term (current) drug therapy: Secondary | ICD-10-CM | POA: Diagnosis not present

## 2023-08-14 DIAGNOSIS — I4891 Unspecified atrial fibrillation: Secondary | ICD-10-CM | POA: Diagnosis not present

## 2023-08-14 DIAGNOSIS — R7303 Prediabetes: Secondary | ICD-10-CM | POA: Diagnosis not present

## 2023-08-14 DIAGNOSIS — Z23 Encounter for immunization: Secondary | ICD-10-CM | POA: Diagnosis not present

## 2023-08-14 DIAGNOSIS — E785 Hyperlipidemia, unspecified: Secondary | ICD-10-CM | POA: Diagnosis not present

## 2023-08-15 LAB — MULTIPLE MYELOMA PANEL, SERUM
Albumin SerPl Elph-Mcnc: 4 g/dL (ref 2.9–4.4)
Albumin/Glob SerPl: 1.7 (ref 0.7–1.7)
Alpha 1: 0.3 g/dL (ref 0.0–0.4)
Alpha2 Glob SerPl Elph-Mcnc: 0.7 g/dL (ref 0.4–1.0)
B-Globulin SerPl Elph-Mcnc: 0.8 g/dL (ref 0.7–1.3)
Gamma Glob SerPl Elph-Mcnc: 0.7 g/dL (ref 0.4–1.8)
Globulin, Total: 2.5 g/dL (ref 2.2–3.9)
IgA: 109 mg/dL (ref 64–422)
IgG (Immunoglobin G), Serum: 770 mg/dL (ref 586–1602)
IgM (Immunoglobulin M), Srm: 62 mg/dL (ref 26–217)
M Protein SerPl Elph-Mcnc: 0.4 g/dL — ABNORMAL HIGH
Total Protein ELP: 6.5 g/dL (ref 6.0–8.5)

## 2023-08-16 DIAGNOSIS — R0602 Shortness of breath: Secondary | ICD-10-CM | POA: Diagnosis not present

## 2023-08-16 DIAGNOSIS — R000 Tachycardia, unspecified: Secondary | ICD-10-CM | POA: Diagnosis not present

## 2023-08-16 LAB — UPEP/TP, 24-HR URINE
Albumin, U: 0 %
Alpha 1, Urine: 0 %
Alpha 2, Urine: 0 %
Beta, Urine: 0 %
Gamma Globulin, Urine: 0 %
Total Protein, Urine-Ur/day: <128 mg/(24.h) (ref 30–150)
Total Protein, Urine: <4 mg/dL
Total Volume: 3200

## 2023-08-17 ENCOUNTER — Other Ambulatory Visit: Payer: Self-pay | Admitting: Pulmonary Disease

## 2023-08-17 DIAGNOSIS — R000 Tachycardia, unspecified: Secondary | ICD-10-CM

## 2023-08-17 DIAGNOSIS — R0602 Shortness of breath: Secondary | ICD-10-CM

## 2023-08-18 ENCOUNTER — Encounter: Payer: Self-pay | Admitting: Oncology

## 2023-08-18 ENCOUNTER — Ambulatory Visit
Admission: RE | Admit: 2023-08-18 | Discharge: 2023-08-18 | Disposition: A | Discharge status: Home / Self Care | Payer: Medicare HMO | Source: Ambulatory Visit | Attending: Pulmonary Disease | Admitting: Pulmonary Disease

## 2023-08-18 ENCOUNTER — Inpatient Hospital Stay (HOSPITAL_BASED_OUTPATIENT_CLINIC_OR_DEPARTMENT_OTHER): Payer: Medicare HMO | Admitting: Oncology

## 2023-08-18 VITALS — BP 119/61 | HR 69 | Temp 98.9°F | Resp 20 | Wt 112.3 lb

## 2023-08-18 DIAGNOSIS — I517 Cardiomegaly: Secondary | ICD-10-CM | POA: Diagnosis not present

## 2023-08-18 DIAGNOSIS — R000 Tachycardia, unspecified: Secondary | ICD-10-CM | POA: Diagnosis not present

## 2023-08-18 DIAGNOSIS — I700 Atherosclerosis of aorta: Secondary | ICD-10-CM | POA: Diagnosis not present

## 2023-08-18 DIAGNOSIS — D472 Monoclonal gammopathy: Secondary | ICD-10-CM | POA: Diagnosis not present

## 2023-08-18 DIAGNOSIS — R0602 Shortness of breath: Secondary | ICD-10-CM | POA: Insufficient documentation

## 2023-08-18 DIAGNOSIS — Z79899 Other long term (current) drug therapy: Secondary | ICD-10-CM | POA: Diagnosis not present

## 2023-08-18 DIAGNOSIS — I2510 Atherosclerotic heart disease of native coronary artery without angina pectoris: Secondary | ICD-10-CM | POA: Diagnosis not present

## 2023-08-18 MED ORDER — IOHEXOL 350 MG/ML SOLN
75.0000 mL | Freq: Once | INTRAVENOUS | Status: AC | PRN
  Administered 2023-08-18: 75 mL via INTRAVENOUS

## 2023-08-18 NOTE — Progress Notes (Signed)
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  Light chain ratio is slightly increased. Lab Results  Component Value Date   MPROTEIN 0.4 (H) 08/11/2023   KPAFRELGTCHN 69.7 (H) 08/11/2023   LAMBDASER 13.2 08/11/2023   KAPLAMBRATIO 5.28 (H) 08/11/2023    Slight increase of M protein, and light chain ratio.  Patient has stable calcium level, normal kidney function.  Normal hemoglobin level.  Obtain 24 hour UPEP I recommend continue observation and patient will follow-up with me in 1 year.  Orders Placed This Encounter  Procedures   DG Bone Survey Met    Standing Status:   Future    Standing Expiration Date:   08/17/2024    Order Specific Question:   Reason for Exam (SYMPTOM  OR DIAGNOSIS REQUIRED)    Answer:   MGUS    Order Specific Question:   Preferred imaging location?    Answer:   [facility]   CBC with Differential (Cancer Center Only)    Standing Status:   Future    Standing Expiration Date:   08/17/2024   CMP (Cancer Center only)    Standing Status:   Future    Standing Expiration Date:   08/17/2024   Multiple Myeloma Panel (SPEP&IFE w/QIG)    Standing Status:   Future    Standing Expiration Date:   08/17/2024   Kappa/lambda light chains    Standing Status:   Future    Standing Expiration Date:   08/17/2024   IFE+PROTEIN ELECTRO, 24-HR UR    Standing Status:   Future    Standing Expiration Date:   08/17/2024   Follow-up in 1 year All questions were answered. The patient knows to call the clinic with any problems, questions or concerns.  Rickard Patience, MD, PhD [facility] Hematology Oncology 08/18/2023    CHIEF COMPLAINTS/REASON FOR VISIT:  Follow up for MGUS  HISTORY OF PRESENTING  ILLNESS:  Mary Thornton is a 82 y.o. female presents for MGUS  Patient recently had work up done at Neurologist's office for lower extremity neuropathy.. Labs reviewed,  03/18/2019 SPEP showed 0.2 M spike, repeat SPEP on 06/27/2019 showed M protein of 0.2. IFE showed IgG Kappa monoclonal protein.  No aggravating or alleviated factors.  Associated signs or symptoms:  Neuropathy: Cool sensation in her face since around June 2019.  Symptoms have been gradually worsening.  She is not diabetic.  Also numbness in her bilateral feet since around October 2019 after lumbar decompression surgery.  Symptoms are worse when she lays down. Denies weight loss, fever chills, night sweating   Bone pain: Chronic back pain. 07/08/2022 - 07/13/2022, patient was hospitalized due to atrial fibrillation with rapid ventricular response.  Patient is now on Eliquis for anticoagulation  INTERVAL HISTORY Mary Thornton is a 82 y.o. female who has above history reviewed by me today presents for follow up visit for management of MGUS Problems and complaints are listed below: Denies any new bone pain.  Chronic back pain unchanged. SOB, she is undergoing pulmonology work up. Had CT done this AM, result is pending,.    Review of Systems  Constitutional:  Negative for appetite change, chills, fatigue and fever.  HENT:   Negative for hearing loss and voice change.  Eyes:  Negative for eye problems.  Respiratory:  Negative for chest tightness and cough.   Cardiovascular:  Negative for chest pain.       A-fib  Gastrointestinal:  Negative for abdominal distention, abdominal pain and blood in stool.  Endocrine: Negative for hot flashes.  Genitourinary:  Negative for difficulty urinating and frequency.   Musculoskeletal:  Negative for arthralgias and back pain.  Skin:  Negative for itching and rash.  Neurological:  Positive for numbness. Negative for extremity weakness.  Hematological:  Negative for adenopathy.   Psychiatric/Behavioral:  Negative for confusion.      MEDICAL HISTORY:  Past Medical History:  Diagnosis Date   Arthritis    Asthma    Barrett esophagus    Complication of anesthesia    very hard to wake up. feels like she had too much medicine. 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 Elmer Picker   CATARACT EXTRACTION W/PHACO Left 07/24/2023   Procedure: CATARACT EXTRACTION PHACO AND INTRAOCULAR LENS PLACEMENT (IOC) LEFT HYDRUS MICROSTENT  6.84  00:38.6;  Surgeon: Nevada Crane, MD;  Location: [facility];  Service: Ophthalmology;  Laterality: Left;   COLONOSCOPY WITH PROPOFOL N/A 06/23/2015   Procedure: COLONOSCOPY WITH PROPOFOL;  Surgeon: Christena Deem, MD;  Location: [facility] ENDOSCOPY;  Service: Endoscopy;  Laterality: N/A;   COLONOSCOPY WITH PROPOFOL N/A 08/11/2020   Procedure: COLONOSCOPY WITH PROPOFOL;  Surgeon: Regis Bill, MD;  Location: [facility] ENDOSCOPY;  Service: Endoscopy;  Laterality: N/A;   ESOPHAGOGASTRODUODENOSCOPY (EGD) WITH PROPOFOL N/A 06/23/2015   Procedure: ESOPHAGOGASTRODUODENOSCOPY (EGD) WITH PROPOFOL;  Surgeon: Christena Deem, MD;  Location: [facility]  ENDOSCOPY;  Service: Endoscopy;  Laterality: N/A;   ESOPHAGOGASTRODUODENOSCOPY (EGD) WITH PROPOFOL N/A 11/28/2017   Procedure: ESOPHAGOGASTRODUODENOSCOPY (EGD) WITH PROPOFOL;  Surgeon: Christena Deem, MD;  Location: [facility] ENDOSCOPY;  Service: Endoscopy;  Laterality: N/A;   ESOPHAGOGASTRODUODENOSCOPY (EGD) WITH PROPOFOL N/A 01/18/2018   Procedure: ESOPHAGOGASTRODUODENOSCOPY (EGD) WITH PROPOFOL;  Surgeon: Christena Deem, MD;  Location: [facility] ENDOSCOPY;  Service: Endoscopy;  Laterality: N/A;   ESOPHAGOGASTRODUODENOSCOPY (EGD) WITH PROPOFOL N/A 08/11/2020   Procedure: ESOPHAGOGASTRODUODENOSCOPY (EGD) WITH PROPOFOL;  Surgeon: Regis Bill, MD;  Location: [facility] ENDOSCOPY;  Service: Endoscopy;  Laterality: N/A;   JOINT REPLACEMENT Left 2009   total knee replacement   KNEE ARTHROSCOPY     LUMBAR LAMINECTOMY/DECOMPRESSION MICRODISCECTOMY N/A 08/22/2018   Procedure: LUMBAR LAMINECTOMY/DECOMPRESSION MICRODISCECTOMY 1 LEVEL-L2-5;  Surgeon: Venetia Night, MD;  Location: [facility] ORS;  Service: Neurosurgery;  Laterality: N/A;   Torticollis surgery x 2  SOCIAL HISTORY: Social History   Socioeconomic History   Marital status: Widowed    Spouse name: Not on file   Number of children: Not on file   Years of education: Not on file   Highest education level: Not on file  Occupational History   Occupation: [employer]    Comment: retired  Tobacco Use   Smoking status: Never   Smokeless tobacco: Never  Vaping Use   Vaping status: Never Used  Substance and Sexual Activity   Alcohol use: Never   Drug use: Never   Sexual activity: Not Currently    Birth control/protection: Surgical  Other Topics Concern   Not on file  Social History Narrative   Not on file   Social Determinants of Health   Financial Resource Strain: Low Risk  (03/05/2023)   Received from [facility]   Overall Financial Resource Strain (CARDIA)    Difficulty of Paying Living Expenses: Not hard  at all  Food Insecurity: No Food Insecurity (03/09/2023)   Hunger Vital Sign    Worried About Running Out of Food in the Last Year: Never true    Ran Out of Food in the Last Year: Never true  Transportation Needs: No Transportation Needs (03/09/2023)   PRAPARE - Administrator, Civil Service (Medical): No    Lack of Transportation (Non-Medical): No  Physical Activity: Not on file  Stress: Not on file  Social Connections: Not on file  Intimate Partner Violence: Not At Risk (03/09/2023)   Humiliation, Afraid, Rape, and Kick questionnaire    Fear of Current or Ex-Partner: No    Emotionally Abused: No    Physically Abused: No    Sexually Abused: No    FAMILY HISTORY: Family History  Problem Relation Age of Onset   Heart disease Mother    Heart disease Sister    Cancer Neg Hx    Diabetes Neg Hx    Breast cancer Neg Hx    Amblyopia Neg Hx    Blindness Neg Hx    Cataracts Neg Hx    Glaucoma Neg Hx    Macular degeneration Neg Hx    Retinal detachment Neg Hx    Strabismus Neg Hx    Retinitis pigmentosa Neg Hx     ALLERGIES:  is allergic to ibuprofen, chlorhexidine, cymbalta [duloxetine hcl], penicillins, and sulfa antibiotics.  MEDICATIONS:  Current Outpatient Medications  Medication Sig Dispense Refill   acetaminophen (TYLENOL) 325 MG tablet Take 650 mg by mouth every 6 (six) hours as needed.     apixaban (ELIQUIS) 2.5 MG TABS tablet Take 2.5 mg by mouth 2 (two) times daily.     atorvastatin (LIPITOR) 40 MG tablet Take 1 tablet (40 mg total) by mouth daily. 30 tablet 0   calcium-vitamin D (OSCAL WITH D) 500-200 MG-UNIT tablet Take 1 tablet by mouth daily.     clorazepate (TRANXENE) 3.75 MG tablet Take 3.75 mg by mouth at bedtime.     diltiazem (CARDIZEM) 60 MG tablet Take 60 mg by mouth 2 (two) times daily.     feeding supplement (ENSURE ENLIVE / ENSURE PLUS) LIQD Take 237 mLs by mouth 2 (two) times daily between meals. 237 mL 12   ferrous sulfate 325 (65 FE) MG tablet  Take 325 mg by mouth daily with breakfast.     levETIRAcetam (KEPPRA) 500 MG tablet Take 250 mg by mouth 2 (two) times daily.     loratadine (CLARITIN) 10 MG tablet  Take 1 tablet (10 mg total) by mouth daily. 30 tablet 0   losartan (COZAAR) 25 MG tablet Take 2 tablets (50 mg total) by mouth 2 (two) times daily. 30 tablet 1   magnesium oxide (MAG-OX) 400 MG tablet Take by mouth.     melatonin 5 MG TABS Take 1 tablet (5 mg total) by mouth at bedtime as needed. 10 tablet 0   montelukast (SINGULAIR) 10 MG tablet Take 10 mg by mouth daily.      Multiple Vitamin (MULTIVITAMIN) tablet Take 1 tablet by mouth daily at 3 pm.      Multiple Vitamins-Minerals (PRESERVISION AREDS PO) Take 2 capsules by mouth 2 (two) times daily. Take one capsule in the morning and one in the evening     pantoprazole (PROTONIX) 40 MG tablet Take 40 mg by mouth 2 (two) times daily.     baclofen (LIORESAL) 20 MG tablet Take 20 mg by mouth 2 (two) times daily. (Patient not taking: Reported on 06/28/2023)     diclofenac Sodium (VOLTAREN) 1 % GEL Apply 2 g topically 4 (four) times daily. (Patient not taking: Reported on 06/28/2023)     metoprolol tartrate (LOPRESSOR) 25 MG tablet Take 0.5 tablets (12.5 mg total) by mouth 2 (two) times daily. (Patient not taking: Reported on 06/28/2023) 30 tablet 2   No current facility-administered medications for this visit.     PHYSICAL EXAMINATION: ECOG PERFORMANCE STATUS: 1 - Symptomatic but completely ambulatory Vitals:   08/18/23 1158  BP: 119/61  Pulse: 69  Resp: 20  Temp: 98.9 F (37.2 C)  SpO2: 100%   Filed Weights   08/18/23 1158  Weight: 112 lb 4.8 oz (50.9 kg)    Physical Exam Constitutional:      General: She is not in acute distress. HENT:     Head: Normocephalic and atraumatic.  Eyes:     General: No scleral icterus. Cardiovascular:     Rate and Rhythm: Normal rate and regular rhythm.     Heart sounds: Normal heart sounds.  Pulmonary:     Effort: Pulmonary effort  is normal. No respiratory distress.     Breath sounds: No wheezing.  Abdominal:     General: Bowel sounds are normal. There is no distension.     Palpations: Abdomen is soft.  Musculoskeletal:        General: No deformity. Normal range of motion.     Cervical back: Normal range of motion and neck supple.  Skin:    General: Skin is warm and dry.     Findings: No erythema or rash.  Neurological:     Mental Status: She is alert and oriented to person, place, and time. Mental status is at baseline.     Cranial Nerves: No cranial nerve deficit.  Psychiatric:        Mood and Affect: Mood normal.      LABORATORY DATA:  I have reviewed the data as listed    Latest Ref Rng & Units 08/11/2023    9:25 AM 03/10/2023    7:19 AM 03/09/2023    6:27 AM  CBC  WBC 4.0 - 10.5 K/uL 5.2  9.7  8.7   Hemoglobin 12.0 - 15.0 g/dL 51.8  8.7  7.8   Hematocrit 36.0 - 46.0 % 34.5  26.6  24.7   Platelets 150 - 400 K/uL 251  301  244       Latest Ref Rng & Units 08/11/2023    9:25 AM 03/10/2023  7:19 AM 03/08/2023    3:06 PM  CMP  Glucose 70 - 99 mg/dL 90  784  99   BUN 8 - 23 mg/dL 14  16  15    Creatinine 0.44 - 1.00 mg/dL 6.96  2.95  2.84   Sodium 135 - 145 mmol/L 136  138  138   Potassium 3.5 - 5.1 mmol/L 4.0  3.8  3.8   Chloride 98 - 111 mmol/L 103  105  104   CO2 22 - 32 mmol/L 26  25  25    Calcium 8.9 - 10.3 mg/dL 8.7  8.4  8.5   Total Protein 6.5 - 8.1 g/dL 6.8     Total Bilirubin 0.3 - 1.2 mg/dL 0.8     Alkaline Phos 38 - 126 U/L 81     AST 15 - 41 U/L 30     ALT 0 - 44 U/L 21        Iron/TIBC/Ferritin/ %Sat No results found for: "IRON", "TIBC", "FERRITIN", "IRONPCTSAT"    RADIOGRAPHIC STUDIES: I have personally reviewed the radiological images as listed and agreed with the findings in the report.  CT Angio Chest Pulmonary Embolism (PE) W or WO Contrast  Result Date: 08/18/2023 CLINICAL DATA:  Worsening shortness of breath EXAM: CT ANGIOGRAPHY CHEST WITH CONTRAST TECHNIQUE:  Multidetector CT imaging of the chest was performed using the standard protocol during bolus administration of intravenous contrast. Multiplanar CT image reconstructions and MIPs were obtained to evaluate the vascular anatomy. RADIATION DOSE REDUCTION: This exam was performed according to the departmental dose-optimization program which includes automated exposure control, adjustment of the mA and/or kV according to patient size and/or use of iterative reconstruction technique. CONTRAST:  75mL OMNIPAQUE IOHEXOL 350 MG/ML SOLN COMPARISON:  Chest CTA dated October 30, 2017 FINDINGS: Cardiovascular: No evidence of pulmonary embolus. Mild cardiomegaly. Trace pericardial effusion. Normal caliber thoracic aorta with moderate atherosclerotic disease. Moderate coronary artery calcifications. Enlarged main pulmonary artery, measuring up to 3.5 cm. Mediastinum/Nodes: Esophagus and thyroid are unremarkable. No enlarged lymph nodes seen in the chest. Lungs/Pleura: Central airways are patent. No consolidation, pleural effusion or pneumothorax. Unchanged mild biapical pleural-parenchymal scarring. Upper Abdomen: No acute abnormality. Musculoskeletal: No chest wall abnormality. No acute or significant osseous findings. Review of the MIP images confirms the above findings. IMPRESSION: 1. No evidence of pulmonary embolus acute airspace opacity. 2. Enlarged main pulmonary artery, which can be seen in the setting of pulmonary hypertension. 3. Coronary artery calcifications and aortic Atherosclerosis (ICD10-I70.0). Electronically Signed   By: Allegra Lai M.D.   On: 08/18/2023 13:07   CT CARDIAC SCORING (SELF PAY ONLY)  Addendum Date: 08/10/2023   ADDENDUM REPORT: 08/10/2023 15:57 ADDENDUM: Coronary calcium score is 42. (LAD 26.7, RCA 15.3). This was 28th percentile for age/gender. Previous calculation was falsely elevated due to incorporation mitral annular calcifications into calculation. Electronically Signed   By: Debbe Odea M.D.   On: 08/10/2023 15:57   Addendum Date: 07/15/2023   ADDENDUM REPORT: 07/15/2023 22:09 EXAM: OVER-READ INTERPRETATION  CT CHEST The following report is an over-read performed by radiologist Dr. Alcide Clever of [facility] on 07/15/2023. This over-read does not include interpretation of cardiac or coronary anatomy or pathology. The coronary calcium score interpretation by the cardiologist is attached. COMPARISON:  None. FINDINGS: Cardiovascular: There are no significant extracardiac vascular findings. Mediastinum/Nodes: There are no enlarged lymph nodes within the visualized mediastinum. Lungs/Pleura: There is no pleural effusion. The visualized lungs appear clear. Upper abdomen: No significant findings  in the visualized upper abdomen. Musculoskeletal/Chest wall: No chest wall mass or suspicious osseous findings within the visualized chest. IMPRESSION: No significant extracardiac findings within the visualized chest. Electronically Signed   By: Alcide Clever M.D.   On: 07/15/2023 22:09   Result Date: 08/10/2023 CLINICAL DATA:  Risk stratification EXAM: Coronary Calcium Score TECHNIQUE: The patient was scanned on a Siemens Somatom scanner. Axial non-contrast 3 mm slices were carried out through the heart. The data set was analyzed on a dedicated work station and scored using the Agatson method. FINDINGS: Non-cardiac: See separate report from [facility]. Ascending Aorta: Normal size, aortic wall calcification Pericardium: Normal Coronary arteries: Normal origin of left and right coronary arteries. Distribution of arterial calcifications if present, as noted below; LM 0 LAD 26.7 LCx 983 RCA 35.5 Total 1045 IMPRESSION AND RECOMMENDATION: 1. Coronary calcium score of 1045. This was 89th percentile for age and sex matched control. 2. CAC >300 in LAD, LCx, RCA. CAC-DRS A3/N3. 3. Recommend aspirin and statin if no contraindication. 4. Recommend cardiology consultation. 5. Continue  heart healthy lifestyle and risk factor modification. Electronically Signed: By: Debbe Odea M.D. On: 07/05/2023 12:58   CT CORONARY MORPH W/CTA COR W/SCORE W/CA W/CM &/OR WO/CM  Result Date: 08/10/2023 CLINICAL DATA:  Dyspnea on exertion EXAM: Cardiac/Coronary  CTA TECHNIQUE: The patient was scanned on a Siemens Somatom go.Top scanner. : A retrospective scan was triggered in the ascending thoracic aorta. Axial non-contrast 3 mm slices were carried out through the heart. The data set was analyzed on a dedicated work station and scored using the Agatson method. Gantry rotation speed was 330 msecs and collimation was .6 mm. 100mg  of metoprolol and 0.8 mg of sl NTG was given. The 3D data set was reconstructed in 5% intervals of the 60-95 % of the R-R cycle. Diastolic phases were analyzed on a dedicated work station using MPR, MIP and VRT modes. The patient received 75 cc of contrast. FINDINGS: Aorta:  Normal size.  Aortic wall calcifications.  No dissection. Aortic Valve:  Trileaflet.  No calcifications. Coronary Arteries:  Normal coronary origin.  Right dominance. RCA is a dominant artery. There is calcified plaque in the proximal RCA causing minimal stenosis (<25%). Left main gives rise to LAD and LCX arteries. LM has no disease. LAD has calcified plaque proximally causing minimal stenosis (<25%). LCX is a non-dominant artery.  There is no plaque. Other findings: Normal pulmonary vein drainage into the left atrium. Normal left atrial appendage without a thrombus. Normal size of the pulmonary artery. IMPRESSION: 1. Coronary calcium recently calculated, score of 42, 28th percentile for age and gender matched controls. 2. Normal coronary origin with right dominance. 3. Minimal LAD and RCA stenosis (<25%). 4. CAD-RADS 1. Minimal non-obstructive CAD (0-24%). Consider preventive therapy and risk factor modification. Electronically Signed   By: Debbe Odea M.D.   On: 08/10/2023 15:55

## 2023-08-18 NOTE — Progress Notes (Signed)
Patient states she has been having some shortness of breathe the last couple of months.

## 2023-08-18 NOTE — Patient Instructions (Signed)
Skeletal survey/ bone survey- this scan is a walk-in and no appointment is needed. You may go to the medical mall at your own convenience to have this scan done.

## 2023-08-18 NOTE — Assessment & Plan Note (Addendum)
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  Light chain ratio is slightly increased. Lab Results  Component Value Date   MPROTEIN 0.4 (H) 08/11/2023   KPAFRELGTCHN 69.7 (H) 08/11/2023   LAMBDASER 13.2 08/11/2023   KAPLAMBRATIO 5.28 (H) 08/11/2023    Slight increase of M protein, and light chain ratio.  Patient has stable calcium level, normal kidney function.  Normal hemoglobin level.  Obtain 24 hour UPEP I recommend continue observation and patient will follow-up with me in 1 year.

## 2023-08-24 DIAGNOSIS — Z0100 Encounter for examination of eyes and vision without abnormal findings: Secondary | ICD-10-CM | POA: Diagnosis not present

## 2023-08-30 DIAGNOSIS — R0602 Shortness of breath: Secondary | ICD-10-CM | POA: Diagnosis not present

## 2023-08-30 DIAGNOSIS — R000 Tachycardia, unspecified: Secondary | ICD-10-CM | POA: Diagnosis not present

## 2023-09-06 DIAGNOSIS — I272 Pulmonary hypertension, unspecified: Secondary | ICD-10-CM | POA: Diagnosis not present

## 2023-09-14 DIAGNOSIS — I2510 Atherosclerotic heart disease of native coronary artery without angina pectoris: Secondary | ICD-10-CM | POA: Diagnosis not present

## 2023-09-14 DIAGNOSIS — I471 Supraventricular tachycardia, unspecified: Secondary | ICD-10-CM | POA: Diagnosis not present

## 2023-09-14 DIAGNOSIS — R0602 Shortness of breath: Secondary | ICD-10-CM | POA: Diagnosis not present

## 2023-09-14 DIAGNOSIS — I25118 Atherosclerotic heart disease of native coronary artery with other forms of angina pectoris: Secondary | ICD-10-CM | POA: Diagnosis not present

## 2023-09-14 DIAGNOSIS — I4891 Unspecified atrial fibrillation: Secondary | ICD-10-CM | POA: Diagnosis not present

## 2023-09-14 DIAGNOSIS — I700 Atherosclerosis of aorta: Secondary | ICD-10-CM | POA: Diagnosis not present

## 2023-09-14 DIAGNOSIS — I10 Essential (primary) hypertension: Secondary | ICD-10-CM | POA: Diagnosis not present

## 2023-09-14 DIAGNOSIS — E782 Mixed hyperlipidemia: Secondary | ICD-10-CM | POA: Diagnosis not present

## 2023-09-14 DIAGNOSIS — R001 Bradycardia, unspecified: Secondary | ICD-10-CM | POA: Diagnosis not present

## 2023-10-02 DIAGNOSIS — H353221 Exudative age-related macular degeneration, left eye, with active choroidal neovascularization: Secondary | ICD-10-CM | POA: Diagnosis not present

## 2023-10-11 DIAGNOSIS — I4891 Unspecified atrial fibrillation: Secondary | ICD-10-CM | POA: Diagnosis not present

## 2023-10-11 DIAGNOSIS — I10 Essential (primary) hypertension: Secondary | ICD-10-CM | POA: Diagnosis not present

## 2023-10-11 DIAGNOSIS — R0602 Shortness of breath: Secondary | ICD-10-CM | POA: Diagnosis not present

## 2023-10-11 DIAGNOSIS — I25118 Atherosclerotic heart disease of native coronary artery with other forms of angina pectoris: Secondary | ICD-10-CM | POA: Diagnosis not present

## 2023-10-11 DIAGNOSIS — R5381 Other malaise: Secondary | ICD-10-CM | POA: Diagnosis not present

## 2023-10-11 DIAGNOSIS — I471 Supraventricular tachycardia, unspecified: Secondary | ICD-10-CM | POA: Diagnosis not present

## 2023-10-11 DIAGNOSIS — E782 Mixed hyperlipidemia: Secondary | ICD-10-CM | POA: Diagnosis not present

## 2023-10-11 DIAGNOSIS — R001 Bradycardia, unspecified: Secondary | ICD-10-CM | POA: Diagnosis not present

## 2023-10-11 DIAGNOSIS — I2510 Atherosclerotic heart disease of native coronary artery without angina pectoris: Secondary | ICD-10-CM | POA: Diagnosis not present

## 2023-10-16 ENCOUNTER — Ambulatory Visit
Admission: RE | Admit: 2023-10-16 | Discharge: 2023-10-16 | Disposition: A | Discharge status: Home / Self Care | Payer: Medicare HMO | Source: Ambulatory Visit | Attending: Internal Medicine | Admitting: Internal Medicine

## 2023-10-16 DIAGNOSIS — Z1231 Encounter for screening mammogram for malignant neoplasm of breast: Secondary | ICD-10-CM | POA: Diagnosis not present

## 2023-10-17 DIAGNOSIS — H353211 Exudative age-related macular degeneration, right eye, with active choroidal neovascularization: Secondary | ICD-10-CM | POA: Diagnosis not present

## 2023-11-02 ENCOUNTER — Ambulatory Visit
Admission: RE | Admit: 2023-11-02 | Discharge: 2023-11-02 | Disposition: A | Discharge status: Home / Self Care | Payer: Medicare HMO | Attending: Internal Medicine | Admitting: Internal Medicine

## 2023-11-02 ENCOUNTER — Encounter: Payer: Self-pay | Admitting: Internal Medicine

## 2023-11-02 ENCOUNTER — Other Ambulatory Visit: Payer: Self-pay

## 2023-11-02 ENCOUNTER — Encounter
Admission: RE | Disposition: A | Discharge status: Home / Self Care | Payer: Self-pay | Source: Home / Self Care | Attending: Internal Medicine

## 2023-11-02 DIAGNOSIS — R0602 Shortness of breath: Secondary | ICD-10-CM | POA: Diagnosis not present

## 2023-11-02 DIAGNOSIS — I25709 Atherosclerosis of coronary artery bypass graft(s), unspecified, with unspecified angina pectoris: Secondary | ICD-10-CM

## 2023-11-02 DIAGNOSIS — R079 Chest pain, unspecified: Secondary | ICD-10-CM

## 2023-11-02 HISTORY — PX: RIGHT/LEFT HEART CATH AND CORONARY ANGIOGRAPHY: CATH118266

## 2023-11-02 LAB — POCT I-STAT EG7
Acid-Base Excess: 3 mmol/L — ABNORMAL HIGH (ref 0.0–2.0)
Acid-base deficit: 2 mmol/L (ref 0.0–2.0)
Acid-base deficit: 2 mmol/L (ref 0.0–2.0)
Bicarbonate: 23.1 mmol/L (ref 20.0–28.0)
Bicarbonate: 23.5 mmol/L (ref 20.0–28.0)
Bicarbonate: 29.1 mmol/L — ABNORMAL HIGH (ref 20.0–28.0)
Calcium, Ion: 0.82 mmol/L — CL (ref 1.15–1.40)
Calcium, Ion: 0.83 mmol/L — CL (ref 1.15–1.40)
Calcium, Ion: 1.21 mmol/L (ref 1.15–1.40)
HCT: 26 % — ABNORMAL LOW (ref 36.0–46.0)
HCT: 25 % — ABNORMAL LOW (ref 36.0–46.0)
HCT: 32 % — ABNORMAL LOW (ref 36.0–46.0)
Hemoglobin: 8.8 g/dL — ABNORMAL LOW (ref 12.0–15.0)
Hemoglobin: 10.9 g/dL — ABNORMAL LOW (ref 12.0–15.0)
Hemoglobin: 8.5 g/dL — ABNORMAL LOW (ref 12.0–15.0)
O2 Saturation: 69 %
O2 Saturation: 34 %
O2 Saturation: 69 %
Potassium: 2.8 mmol/L — ABNORMAL LOW (ref 3.5–5.1)
Potassium: 2.7 mmol/L — CL (ref 3.5–5.1)
Potassium: 3.7 mmol/L (ref 3.5–5.1)
Sodium: 145 mmol/L (ref 135–145)
Sodium: 143 mmol/L (ref 135–145)
Sodium: 146 mmol/L — ABNORMAL HIGH (ref 135–145)
TCO2: 24 mmol/L (ref 22–32)
TCO2: 25 mmol/L (ref 22–32)
TCO2: 31 mmol/L (ref 22–32)
pCO2, Ven: 41.4 mm[Hg] — ABNORMAL LOW (ref 44–60)
pCO2, Ven: 42.7 mm[Hg] — ABNORMAL LOW (ref 44–60)
pCO2, Ven: 48.8 mm[Hg] (ref 44–60)
pH, Ven: 7.355 (ref 7.25–7.43)
pH, Ven: 7.348 (ref 7.25–7.43)
pH, Ven: 7.384 (ref 7.25–7.43)
pO2, Ven: 37 mm[Hg] (ref 32–45)
pO2, Ven: 22 mm[Hg] — CL (ref 32–45)
pO2, Ven: 37 mm[Hg] (ref 32–45)

## 2023-11-02 LAB — POCT I-STAT 7, (LYTES, BLD GAS, ICA,H+H)
Acid-Base Excess: 2 mmol/L (ref 0.0–2.0)
Bicarbonate: 27 mmol/L (ref 20.0–28.0)
Calcium, Ion: 1.17 mmol/L (ref 1.15–1.40)
HCT: 31 % — ABNORMAL LOW (ref 36.0–46.0)
Hemoglobin: 10.5 g/dL — ABNORMAL LOW (ref 12.0–15.0)
O2 Saturation: 94 %
Potassium: 3.7 mmol/L (ref 3.5–5.1)
Sodium: 142 mmol/L (ref 135–145)
TCO2: 28 mmol/L (ref 22–32)
pCO2 arterial: 42.9 mm[Hg] (ref 32–48)
pH, Arterial: 7.408 (ref 7.35–7.45)
pO2, Arterial: 70 mm[Hg] — ABNORMAL LOW (ref 83–108)

## 2023-11-02 SURGERY — RIGHT/LEFT HEART CATH AND CORONARY ANGIOGRAPHY
Anesthesia: Moderate Sedation | Laterality: Bilateral

## 2023-11-02 MED ORDER — IOHEXOL 300 MG/ML  SOLN
INTRAMUSCULAR | Status: DC | PRN
  Administered 2023-11-02: 50 mL

## 2023-11-02 MED ORDER — HEPARIN (PORCINE) IN NACL 2000-0.9 UNIT/L-% IV SOLN
INTRAVENOUS | Status: DC | PRN
  Administered 2023-11-02: 1000 mL

## 2023-11-02 MED ORDER — FENTANYL CITRATE (PF) 100 MCG/2ML IJ SOLN: 12.5000 ug | Freq: Once | INTRAMUSCULAR | Status: DC

## 2023-11-02 MED ORDER — FENTANYL CITRATE (PF) 100 MCG/2ML IJ SOLN
INTRAMUSCULAR | Status: AC
  Filled 2023-11-02: qty 2

## 2023-11-02 MED ORDER — VERAPAMIL HCL 2.5 MG/ML IV SOLN
INTRAVENOUS | Status: AC
  Filled 2023-11-02: qty 2

## 2023-11-02 MED ORDER — ACETAMINOPHEN 500 MG PO TABS
ORAL_TABLET | ORAL | Status: AC
  Administered 2023-11-02: 1000 mg via ORAL
  Filled 2023-11-02: qty 2

## 2023-11-02 MED ORDER — MIDAZOLAM HCL 2 MG/2ML IJ SOLN
INTRAMUSCULAR | Status: DC | PRN
  Administered 2023-11-02: .5 mg via INTRAVENOUS

## 2023-11-02 MED ORDER — MIDAZOLAM HCL 2 MG/2ML IJ SOLN: 1.0000 mg | Freq: Once | INTRAMUSCULAR | Status: DC

## 2023-11-02 MED ORDER — HEPARIN (PORCINE) IN NACL 1000-0.9 UT/500ML-% IV SOLN
INTRAVENOUS | Status: AC
Start: 2023-11-02 — End: ?
  Filled 2023-11-02: qty 1000

## 2023-11-02 MED ORDER — LIDOCAINE HCL (PF) 1 % IJ SOLN
INTRAMUSCULAR | Status: DC | PRN
  Administered 2023-11-02: 5 mL

## 2023-11-02 MED ORDER — ACETAMINOPHEN 500 MG PO TABS: 1000.0000 mg | ORAL_TABLET | ORAL | Status: DC | PRN

## 2023-11-02 MED ORDER — FENTANYL CITRATE (PF) 100 MCG/2ML IJ SOLN: 12.5000 ug | INTRAMUSCULAR | Status: DC | PRN

## 2023-11-02 MED ORDER — MIDAZOLAM HCL 2 MG/2ML IJ SOLN
INTRAMUSCULAR | Status: AC
  Filled 2023-11-02: qty 2

## 2023-11-02 MED ORDER — LIDOCAINE HCL 1 % IJ SOLN
INTRAMUSCULAR | Status: AC
  Filled 2023-11-02: qty 20

## 2023-11-02 MED ORDER — HEPARIN SODIUM (PORCINE) 1000 UNIT/ML IJ SOLN
INTRAMUSCULAR | Status: DC | PRN
  Administered 2023-11-02: 2500 [IU] via INTRAVENOUS

## 2023-11-02 MED ORDER — SODIUM CHLORIDE 0.9 % WEIGHT BASED INFUSION: 1.0000 mL/kg/h | INTRAVENOUS | Status: DC

## 2023-11-02 MED ORDER — HEPARIN SODIUM (PORCINE) 1000 UNIT/ML IJ SOLN
INTRAMUSCULAR | Status: AC
Start: 2023-11-02 — End: ?
  Filled 2023-11-02: qty 10

## 2023-11-02 MED ORDER — SODIUM CHLORIDE 0.9 % IV SOLN: 250.0000 mL | INTRAVENOUS | Status: DC | PRN

## 2023-11-02 MED ORDER — VERAPAMIL HCL 2.5 MG/ML IV SOLN
INTRAVENOUS | Status: DC | PRN
  Administered 2023-11-02: 2.5 mg via INTRA_ARTERIAL

## 2023-11-02 MED ORDER — ASPIRIN 81 MG PO CHEW: 81.0000 mg | CHEWABLE_TABLET | ORAL | Status: DC

## 2023-11-02 MED ORDER — ACETAMINOPHEN 500 MG PO TABS: 1000.0000 mg | ORAL_TABLET | Freq: Once | ORAL | Status: AC

## 2023-11-02 MED ORDER — SODIUM CHLORIDE 0.9% FLUSH: 3.0000 mL | INTRAVENOUS | Status: DC | PRN

## 2023-11-02 MED ORDER — SODIUM CHLORIDE 0.9 % WEIGHT BASED INFUSION
3.0000 mL/kg/h | INTRAVENOUS | Status: AC
  Administered 2023-11-02: 3 mL/kg/h via INTRAVENOUS

## 2023-11-02 MED ORDER — SODIUM CHLORIDE 0.9% FLUSH: 3.0000 mL | Freq: Two times a day (BID) | INTRAVENOUS | Status: DC

## 2023-11-02 SURGICAL SUPPLY — 13 items
CATH 5FR JL3.5 JR4 ANG PIG MP (CATHETERS) IMPLANT
CATH BALLN WEDGE 5F 110CM (CATHETERS) IMPLANT
DEVICE RAD TR BAND REGULAR (VASCULAR PRODUCTS) IMPLANT
DRAPE BRACHIAL (DRAPES) IMPLANT
GUIDEWIRE .025 260CM (WIRE) IMPLANT
GUIDEWIRE EMER 3M J .025X150CM (WIRE) IMPLANT
GUIDEWIRE INQWIRE 1.5J.035X260 (WIRE) IMPLANT
INQWIRE 1.5J .035X260CM (WIRE) ×1
PACK CARDIAC CATH (CUSTOM PROCEDURE TRAY) ×1 IMPLANT
PROTECTION STATION PRESSURIZED (MISCELLANEOUS) ×1
SET ATX-X65L (MISCELLANEOUS) IMPLANT
SHEATH GLIDE SLENDER 4/5FR (SHEATH) IMPLANT
STATION PROTECTION PRESSURIZED (MISCELLANEOUS) IMPLANT

## 2023-11-02 NOTE — Progress Notes (Signed)
 Pt came to [facility] from cath lab with no pulse oximetry waveform, deflated air from TR band until able to get acceptable reverse Barbeau. MD notified. Pt developed hematoma during initial deflation process, however no additional complications until TR band removal. Within a few minutes of TR band removal, pt developed large hematoma at radial site. Pressure held per orders, hematoma reduced in size. Second hematoma developed and pressure held per orders. MD was notified and kept informed throughout this process, stated he would come to bedside. Ordered for TR band to be reapplied, Rosina from cath lab reapplied. Hematoma developed after band application, air added, MD notified.

## 2023-11-02 NOTE — Progress Notes (Signed)
 Per MD, Ms.Waxman can leave at 6:20pm. Will continue to monitor her radial site and notify MD of any changes before discharge.

## 2023-11-02 NOTE — Progress Notes (Signed)
 Dr.Callwood at bedside holding pressure on right radial site.

## 2023-11-02 NOTE — Progress Notes (Signed)
 No further issues, patient discharged.

## 2023-11-04 LAB — CARDIAC CATHETERIZATION: Cath EF Quantitative: 50 %

## 2023-11-06 ENCOUNTER — Encounter: Payer: Self-pay | Admitting: Internal Medicine

## 2023-11-07 DIAGNOSIS — R7303 Prediabetes: Secondary | ICD-10-CM | POA: Diagnosis not present

## 2023-11-07 DIAGNOSIS — I10 Essential (primary) hypertension: Secondary | ICD-10-CM | POA: Diagnosis not present

## 2023-11-07 DIAGNOSIS — Z79899 Other long term (current) drug therapy: Secondary | ICD-10-CM | POA: Diagnosis not present

## 2023-11-07 DIAGNOSIS — E785 Hyperlipidemia, unspecified: Secondary | ICD-10-CM | POA: Diagnosis not present

## 2023-11-10 ENCOUNTER — Other Ambulatory Visit: Payer: Self-pay | Admitting: Internal Medicine

## 2023-11-10 DIAGNOSIS — E782 Mixed hyperlipidemia: Secondary | ICD-10-CM | POA: Diagnosis not present

## 2023-11-10 DIAGNOSIS — I471 Supraventricular tachycardia, unspecified: Secondary | ICD-10-CM | POA: Diagnosis not present

## 2023-11-10 DIAGNOSIS — R0602 Shortness of breath: Secondary | ICD-10-CM

## 2023-11-10 DIAGNOSIS — R2231 Localized swelling, mass and lump, right upper limb: Secondary | ICD-10-CM

## 2023-11-10 DIAGNOSIS — R001 Bradycardia, unspecified: Secondary | ICD-10-CM | POA: Diagnosis not present

## 2023-11-10 DIAGNOSIS — Z9889 Other specified postprocedural states: Secondary | ICD-10-CM | POA: Diagnosis not present

## 2023-11-10 DIAGNOSIS — I4891 Unspecified atrial fibrillation: Secondary | ICD-10-CM | POA: Diagnosis not present

## 2023-11-10 DIAGNOSIS — I2510 Atherosclerotic heart disease of native coronary artery without angina pectoris: Secondary | ICD-10-CM | POA: Diagnosis not present

## 2023-11-10 DIAGNOSIS — I10 Essential (primary) hypertension: Secondary | ICD-10-CM | POA: Diagnosis not present

## 2023-11-14 ENCOUNTER — Other Ambulatory Visit: Payer: Self-pay | Admitting: Emergency Medicine

## 2023-11-14 ENCOUNTER — Other Ambulatory Visit: Payer: Self-pay | Admitting: Internal Medicine

## 2023-11-14 ENCOUNTER — Ambulatory Visit: Payer: Medicare HMO

## 2023-11-14 ENCOUNTER — Other Ambulatory Visit
Admission: RE | Admit: 2023-11-14 | Discharge: 2023-11-14 | Disposition: A | Discharge status: Home / Self Care | Payer: Medicare HMO | Source: Ambulatory Visit | Attending: Emergency Medicine | Admitting: Emergency Medicine

## 2023-11-14 DIAGNOSIS — R0902 Hypoxemia: Secondary | ICD-10-CM

## 2023-11-14 DIAGNOSIS — J441 Chronic obstructive pulmonary disease with (acute) exacerbation: Secondary | ICD-10-CM | POA: Diagnosis not present

## 2023-11-14 DIAGNOSIS — R2231 Localized swelling, mass and lump, right upper limb: Secondary | ICD-10-CM

## 2023-11-14 DIAGNOSIS — I25118 Atherosclerotic heart disease of native coronary artery with other forms of angina pectoris: Secondary | ICD-10-CM | POA: Diagnosis not present

## 2023-11-14 DIAGNOSIS — I10 Essential (primary) hypertension: Secondary | ICD-10-CM | POA: Diagnosis not present

## 2023-11-14 DIAGNOSIS — K219 Gastro-esophageal reflux disease without esophagitis: Secondary | ICD-10-CM | POA: Diagnosis not present

## 2023-11-14 DIAGNOSIS — I2510 Atherosclerotic heart disease of native coronary artery without angina pectoris: Secondary | ICD-10-CM

## 2023-11-14 DIAGNOSIS — R7303 Prediabetes: Secondary | ICD-10-CM | POA: Diagnosis not present

## 2023-11-14 DIAGNOSIS — I4891 Unspecified atrial fibrillation: Secondary | ICD-10-CM | POA: Diagnosis not present

## 2023-11-14 DIAGNOSIS — J9601 Acute respiratory failure with hypoxia: Secondary | ICD-10-CM

## 2023-11-14 DIAGNOSIS — E785 Hyperlipidemia, unspecified: Secondary | ICD-10-CM | POA: Diagnosis not present

## 2023-11-14 DIAGNOSIS — J9602 Acute respiratory failure with hypercapnia: Secondary | ICD-10-CM | POA: Diagnosis not present

## 2023-11-14 DIAGNOSIS — E782 Mixed hyperlipidemia: Secondary | ICD-10-CM | POA: Diagnosis not present

## 2023-11-14 DIAGNOSIS — I480 Paroxysmal atrial fibrillation: Secondary | ICD-10-CM | POA: Diagnosis not present

## 2023-11-14 DIAGNOSIS — Z0000 Encounter for general adult medical examination without abnormal findings: Secondary | ICD-10-CM | POA: Diagnosis not present

## 2023-11-14 LAB — BLOOD GAS, ARTERIAL
Acid-Base Excess: 3.2 mmol/L — ABNORMAL HIGH (ref 0.0–2.0)
Allens test (pass/fail): DECREASED — AB
Bicarbonate: 27.9 mmol/L (ref 20.0–28.0)
FIO2: 21 %
O2 Saturation: 97.9 %
Patient temperature: 37
pCO2 arterial: 42 mm[Hg] (ref 32–48)
pH, Arterial: 7.43 (ref 7.35–7.45)
pO2, Arterial: 85 mm[Hg] (ref 83–108)

## 2023-11-16 ENCOUNTER — Ambulatory Visit
Admission: RE | Admit: 2023-11-16 | Discharge: 2023-11-16 | Disposition: A | Discharge status: Home / Self Care | Payer: Medicare HMO | Source: Ambulatory Visit | Attending: Internal Medicine | Admitting: Internal Medicine

## 2023-11-16 DIAGNOSIS — M25431 Effusion, right wrist: Secondary | ICD-10-CM | POA: Diagnosis not present

## 2023-11-16 DIAGNOSIS — R2231 Localized swelling, mass and lump, right upper limb: Secondary | ICD-10-CM

## 2023-11-28 DIAGNOSIS — R0902 Hypoxemia: Secondary | ICD-10-CM | POA: Diagnosis not present

## 2023-11-28 DIAGNOSIS — Z9981 Dependence on supplemental oxygen: Secondary | ICD-10-CM | POA: Diagnosis not present

## 2023-12-04 DIAGNOSIS — H353221 Exudative age-related macular degeneration, left eye, with active choroidal neovascularization: Secondary | ICD-10-CM | POA: Diagnosis not present

## 2023-12-12 DIAGNOSIS — I272 Pulmonary hypertension, unspecified: Secondary | ICD-10-CM | POA: Diagnosis not present

## 2023-12-12 DIAGNOSIS — J9611 Chronic respiratory failure with hypoxia: Secondary | ICD-10-CM | POA: Diagnosis not present

## 2023-12-12 DIAGNOSIS — R5381 Other malaise: Secondary | ICD-10-CM | POA: Diagnosis not present

## 2023-12-12 DIAGNOSIS — J8410 Pulmonary fibrosis, unspecified: Secondary | ICD-10-CM | POA: Diagnosis not present

## 2023-12-12 DIAGNOSIS — R0609 Other forms of dyspnea: Secondary | ICD-10-CM | POA: Diagnosis not present

## 2023-12-16 DIAGNOSIS — J8410 Pulmonary fibrosis, unspecified: Secondary | ICD-10-CM | POA: Diagnosis not present

## 2023-12-16 DIAGNOSIS — K2270 Barrett's esophagus without dysplasia: Secondary | ICD-10-CM | POA: Diagnosis not present

## 2023-12-16 DIAGNOSIS — H409 Unspecified glaucoma: Secondary | ICD-10-CM | POA: Diagnosis not present

## 2023-12-16 DIAGNOSIS — I4891 Unspecified atrial fibrillation: Secondary | ICD-10-CM | POA: Diagnosis not present

## 2023-12-16 DIAGNOSIS — J9611 Chronic respiratory failure with hypoxia: Secondary | ICD-10-CM | POA: Diagnosis not present

## 2023-12-16 DIAGNOSIS — J45909 Unspecified asthma, uncomplicated: Secondary | ICD-10-CM | POA: Diagnosis not present

## 2023-12-16 DIAGNOSIS — I2510 Atherosclerotic heart disease of native coronary artery without angina pectoris: Secondary | ICD-10-CM | POA: Diagnosis not present

## 2023-12-16 DIAGNOSIS — G7100 Muscular dystrophy, unspecified: Secondary | ICD-10-CM | POA: Diagnosis not present

## 2023-12-16 DIAGNOSIS — K5790 Diverticulosis of intestine, part unspecified, without perforation or abscess without bleeding: Secondary | ICD-10-CM | POA: Diagnosis not present

## 2023-12-19 DIAGNOSIS — H409 Unspecified glaucoma: Secondary | ICD-10-CM | POA: Diagnosis not present

## 2023-12-19 DIAGNOSIS — G7100 Muscular dystrophy, unspecified: Secondary | ICD-10-CM | POA: Diagnosis not present

## 2023-12-19 DIAGNOSIS — K2270 Barrett's esophagus without dysplasia: Secondary | ICD-10-CM | POA: Diagnosis not present

## 2023-12-19 DIAGNOSIS — I4891 Unspecified atrial fibrillation: Secondary | ICD-10-CM | POA: Diagnosis not present

## 2023-12-19 DIAGNOSIS — J45909 Unspecified asthma, uncomplicated: Secondary | ICD-10-CM | POA: Diagnosis not present

## 2023-12-19 DIAGNOSIS — I2510 Atherosclerotic heart disease of native coronary artery without angina pectoris: Secondary | ICD-10-CM | POA: Diagnosis not present

## 2023-12-19 DIAGNOSIS — J8410 Pulmonary fibrosis, unspecified: Secondary | ICD-10-CM | POA: Diagnosis not present

## 2023-12-19 DIAGNOSIS — K5790 Diverticulosis of intestine, part unspecified, without perforation or abscess without bleeding: Secondary | ICD-10-CM | POA: Diagnosis not present

## 2023-12-19 DIAGNOSIS — J9611 Chronic respiratory failure with hypoxia: Secondary | ICD-10-CM | POA: Diagnosis not present

## 2023-12-21 DIAGNOSIS — K2270 Barrett's esophagus without dysplasia: Secondary | ICD-10-CM | POA: Diagnosis not present

## 2023-12-21 DIAGNOSIS — G7100 Muscular dystrophy, unspecified: Secondary | ICD-10-CM | POA: Diagnosis not present

## 2023-12-21 DIAGNOSIS — H409 Unspecified glaucoma: Secondary | ICD-10-CM | POA: Diagnosis not present

## 2023-12-21 DIAGNOSIS — J8410 Pulmonary fibrosis, unspecified: Secondary | ICD-10-CM | POA: Diagnosis not present

## 2023-12-21 DIAGNOSIS — K5790 Diverticulosis of intestine, part unspecified, without perforation or abscess without bleeding: Secondary | ICD-10-CM | POA: Diagnosis not present

## 2023-12-21 DIAGNOSIS — J9611 Chronic respiratory failure with hypoxia: Secondary | ICD-10-CM | POA: Diagnosis not present

## 2023-12-21 DIAGNOSIS — I2510 Atherosclerotic heart disease of native coronary artery without angina pectoris: Secondary | ICD-10-CM | POA: Diagnosis not present

## 2023-12-21 DIAGNOSIS — J45909 Unspecified asthma, uncomplicated: Secondary | ICD-10-CM | POA: Diagnosis not present

## 2023-12-21 DIAGNOSIS — I4891 Unspecified atrial fibrillation: Secondary | ICD-10-CM | POA: Diagnosis not present

## 2023-12-25 DIAGNOSIS — K2270 Barrett's esophagus without dysplasia: Secondary | ICD-10-CM | POA: Diagnosis not present

## 2023-12-25 DIAGNOSIS — K5790 Diverticulosis of intestine, part unspecified, without perforation or abscess without bleeding: Secondary | ICD-10-CM | POA: Diagnosis not present

## 2023-12-25 DIAGNOSIS — I2510 Atherosclerotic heart disease of native coronary artery without angina pectoris: Secondary | ICD-10-CM | POA: Diagnosis not present

## 2023-12-25 DIAGNOSIS — I4891 Unspecified atrial fibrillation: Secondary | ICD-10-CM | POA: Diagnosis not present

## 2023-12-25 DIAGNOSIS — H409 Unspecified glaucoma: Secondary | ICD-10-CM | POA: Diagnosis not present

## 2023-12-25 DIAGNOSIS — J8410 Pulmonary fibrosis, unspecified: Secondary | ICD-10-CM | POA: Diagnosis not present

## 2023-12-25 DIAGNOSIS — G7100 Muscular dystrophy, unspecified: Secondary | ICD-10-CM | POA: Diagnosis not present

## 2023-12-25 DIAGNOSIS — J9611 Chronic respiratory failure with hypoxia: Secondary | ICD-10-CM | POA: Diagnosis not present

## 2023-12-25 DIAGNOSIS — J45909 Unspecified asthma, uncomplicated: Secondary | ICD-10-CM | POA: Diagnosis not present

## 2023-12-26 DIAGNOSIS — H353211 Exudative age-related macular degeneration, right eye, with active choroidal neovascularization: Secondary | ICD-10-CM | POA: Diagnosis not present

## 2023-12-28 DIAGNOSIS — H409 Unspecified glaucoma: Secondary | ICD-10-CM | POA: Diagnosis not present

## 2023-12-28 DIAGNOSIS — I4891 Unspecified atrial fibrillation: Secondary | ICD-10-CM | POA: Diagnosis not present

## 2023-12-28 DIAGNOSIS — J45909 Unspecified asthma, uncomplicated: Secondary | ICD-10-CM | POA: Diagnosis not present

## 2023-12-28 DIAGNOSIS — J8410 Pulmonary fibrosis, unspecified: Secondary | ICD-10-CM | POA: Diagnosis not present

## 2023-12-28 DIAGNOSIS — I2510 Atherosclerotic heart disease of native coronary artery without angina pectoris: Secondary | ICD-10-CM | POA: Diagnosis not present

## 2023-12-28 DIAGNOSIS — K5790 Diverticulosis of intestine, part unspecified, without perforation or abscess without bleeding: Secondary | ICD-10-CM | POA: Diagnosis not present

## 2023-12-28 DIAGNOSIS — J9611 Chronic respiratory failure with hypoxia: Secondary | ICD-10-CM | POA: Diagnosis not present

## 2023-12-28 DIAGNOSIS — K2270 Barrett's esophagus without dysplasia: Secondary | ICD-10-CM | POA: Diagnosis not present

## 2023-12-28 DIAGNOSIS — G7100 Muscular dystrophy, unspecified: Secondary | ICD-10-CM | POA: Diagnosis not present

## 2024-01-02 DIAGNOSIS — I2510 Atherosclerotic heart disease of native coronary artery without angina pectoris: Secondary | ICD-10-CM | POA: Diagnosis not present

## 2024-01-02 DIAGNOSIS — J45909 Unspecified asthma, uncomplicated: Secondary | ICD-10-CM | POA: Diagnosis not present

## 2024-01-02 DIAGNOSIS — G7100 Muscular dystrophy, unspecified: Secondary | ICD-10-CM | POA: Diagnosis not present

## 2024-01-02 DIAGNOSIS — J9611 Chronic respiratory failure with hypoxia: Secondary | ICD-10-CM | POA: Diagnosis not present

## 2024-01-02 DIAGNOSIS — J8410 Pulmonary fibrosis, unspecified: Secondary | ICD-10-CM | POA: Diagnosis not present

## 2024-01-02 DIAGNOSIS — H409 Unspecified glaucoma: Secondary | ICD-10-CM | POA: Diagnosis not present

## 2024-01-02 DIAGNOSIS — K2270 Barrett's esophagus without dysplasia: Secondary | ICD-10-CM | POA: Diagnosis not present

## 2024-01-02 DIAGNOSIS — I4891 Unspecified atrial fibrillation: Secondary | ICD-10-CM | POA: Diagnosis not present

## 2024-01-02 DIAGNOSIS — K5790 Diverticulosis of intestine, part unspecified, without perforation or abscess without bleeding: Secondary | ICD-10-CM | POA: Diagnosis not present

## 2024-01-04 DIAGNOSIS — J9611 Chronic respiratory failure with hypoxia: Secondary | ICD-10-CM | POA: Diagnosis not present

## 2024-01-04 DIAGNOSIS — G7100 Muscular dystrophy, unspecified: Secondary | ICD-10-CM | POA: Diagnosis not present

## 2024-01-04 DIAGNOSIS — J45909 Unspecified asthma, uncomplicated: Secondary | ICD-10-CM | POA: Diagnosis not present

## 2024-01-04 DIAGNOSIS — I4891 Unspecified atrial fibrillation: Secondary | ICD-10-CM | POA: Diagnosis not present

## 2024-01-04 DIAGNOSIS — H409 Unspecified glaucoma: Secondary | ICD-10-CM | POA: Diagnosis not present

## 2024-01-04 DIAGNOSIS — K5790 Diverticulosis of intestine, part unspecified, without perforation or abscess without bleeding: Secondary | ICD-10-CM | POA: Diagnosis not present

## 2024-01-04 DIAGNOSIS — K2270 Barrett's esophagus without dysplasia: Secondary | ICD-10-CM | POA: Diagnosis not present

## 2024-01-04 DIAGNOSIS — I2510 Atherosclerotic heart disease of native coronary artery without angina pectoris: Secondary | ICD-10-CM | POA: Diagnosis not present

## 2024-01-04 DIAGNOSIS — J8410 Pulmonary fibrosis, unspecified: Secondary | ICD-10-CM | POA: Diagnosis not present

## 2024-01-08 DIAGNOSIS — J45909 Unspecified asthma, uncomplicated: Secondary | ICD-10-CM | POA: Diagnosis not present

## 2024-01-08 DIAGNOSIS — D649 Anemia, unspecified: Secondary | ICD-10-CM | POA: Diagnosis not present

## 2024-01-08 DIAGNOSIS — K2270 Barrett's esophagus without dysplasia: Secondary | ICD-10-CM | POA: Diagnosis not present

## 2024-01-08 DIAGNOSIS — G7100 Muscular dystrophy, unspecified: Secondary | ICD-10-CM | POA: Diagnosis not present

## 2024-01-08 DIAGNOSIS — J9611 Chronic respiratory failure with hypoxia: Secondary | ICD-10-CM | POA: Diagnosis not present

## 2024-01-08 DIAGNOSIS — R7303 Prediabetes: Secondary | ICD-10-CM | POA: Diagnosis not present

## 2024-01-08 DIAGNOSIS — J8410 Pulmonary fibrosis, unspecified: Secondary | ICD-10-CM | POA: Diagnosis not present

## 2024-01-08 DIAGNOSIS — H409 Unspecified glaucoma: Secondary | ICD-10-CM | POA: Diagnosis not present

## 2024-01-08 DIAGNOSIS — I4891 Unspecified atrial fibrillation: Secondary | ICD-10-CM | POA: Diagnosis not present

## 2024-01-08 DIAGNOSIS — E782 Mixed hyperlipidemia: Secondary | ICD-10-CM | POA: Diagnosis not present

## 2024-01-08 DIAGNOSIS — I10 Essential (primary) hypertension: Secondary | ICD-10-CM | POA: Diagnosis not present

## 2024-01-08 DIAGNOSIS — I2510 Atherosclerotic heart disease of native coronary artery without angina pectoris: Secondary | ICD-10-CM | POA: Diagnosis not present

## 2024-01-08 DIAGNOSIS — Z79899 Other long term (current) drug therapy: Secondary | ICD-10-CM | POA: Diagnosis not present

## 2024-01-08 DIAGNOSIS — Z23 Encounter for immunization: Secondary | ICD-10-CM | POA: Diagnosis not present

## 2024-01-08 DIAGNOSIS — K5790 Diverticulosis of intestine, part unspecified, without perforation or abscess without bleeding: Secondary | ICD-10-CM | POA: Diagnosis not present

## 2024-01-15 ENCOUNTER — Encounter (HOSPITAL_COMMUNITY): Payer: Self-pay

## 2024-01-15 DIAGNOSIS — J8410 Pulmonary fibrosis, unspecified: Secondary | ICD-10-CM | POA: Diagnosis not present

## 2024-01-15 DIAGNOSIS — K2270 Barrett's esophagus without dysplasia: Secondary | ICD-10-CM | POA: Diagnosis not present

## 2024-01-15 DIAGNOSIS — J9611 Chronic respiratory failure with hypoxia: Secondary | ICD-10-CM | POA: Diagnosis not present

## 2024-01-15 DIAGNOSIS — G7100 Muscular dystrophy, unspecified: Secondary | ICD-10-CM | POA: Diagnosis not present

## 2024-01-15 DIAGNOSIS — I2510 Atherosclerotic heart disease of native coronary artery without angina pectoris: Secondary | ICD-10-CM | POA: Diagnosis not present

## 2024-01-15 DIAGNOSIS — I4891 Unspecified atrial fibrillation: Secondary | ICD-10-CM | POA: Diagnosis not present

## 2024-01-15 DIAGNOSIS — J45909 Unspecified asthma, uncomplicated: Secondary | ICD-10-CM | POA: Diagnosis not present

## 2024-01-16 DIAGNOSIS — R569 Unspecified convulsions: Secondary | ICD-10-CM | POA: Diagnosis not present

## 2024-01-16 DIAGNOSIS — Z8679 Personal history of other diseases of the circulatory system: Secondary | ICD-10-CM | POA: Diagnosis not present

## 2024-01-17 ENCOUNTER — Other Ambulatory Visit: Payer: Self-pay | Admitting: Internal Medicine

## 2024-01-17 ENCOUNTER — Ambulatory Visit
Admission: RE | Admit: 2024-01-17 | Discharge: 2024-01-17 | Disposition: A | Discharge status: Home / Self Care | Payer: Medicare HMO | Source: Ambulatory Visit | Attending: Internal Medicine | Admitting: Internal Medicine

## 2024-01-17 DIAGNOSIS — R0602 Shortness of breath: Secondary | ICD-10-CM

## 2024-01-17 DIAGNOSIS — I2510 Atherosclerotic heart disease of native coronary artery without angina pectoris: Secondary | ICD-10-CM | POA: Diagnosis not present

## 2024-01-17 DIAGNOSIS — I4891 Unspecified atrial fibrillation: Secondary | ICD-10-CM | POA: Diagnosis not present

## 2024-01-17 DIAGNOSIS — J9611 Chronic respiratory failure with hypoxia: Secondary | ICD-10-CM | POA: Diagnosis not present

## 2024-01-17 DIAGNOSIS — K2270 Barrett's esophagus without dysplasia: Secondary | ICD-10-CM | POA: Diagnosis not present

## 2024-01-17 DIAGNOSIS — J8410 Pulmonary fibrosis, unspecified: Secondary | ICD-10-CM | POA: Diagnosis not present

## 2024-01-17 DIAGNOSIS — K5790 Diverticulosis of intestine, part unspecified, without perforation or abscess without bleeding: Secondary | ICD-10-CM | POA: Diagnosis not present

## 2024-01-17 DIAGNOSIS — G7100 Muscular dystrophy, unspecified: Secondary | ICD-10-CM | POA: Diagnosis not present

## 2024-01-17 DIAGNOSIS — H409 Unspecified glaucoma: Secondary | ICD-10-CM | POA: Diagnosis not present

## 2024-01-17 DIAGNOSIS — J45909 Unspecified asthma, uncomplicated: Secondary | ICD-10-CM | POA: Diagnosis not present

## 2024-01-17 MED ORDER — GADOBUTROL 1 MMOL/ML IV SOLN
8.0000 mL | Freq: Once | INTRAVENOUS | Status: AC | PRN
  Administered 2024-01-17: 8 mL via INTRAVENOUS

## 2024-02-09 DIAGNOSIS — I10 Essential (primary) hypertension: Secondary | ICD-10-CM | POA: Diagnosis not present

## 2024-02-12 DIAGNOSIS — H353221 Exudative age-related macular degeneration, left eye, with active choroidal neovascularization: Secondary | ICD-10-CM | POA: Diagnosis not present

## 2024-02-16 DIAGNOSIS — M1711 Unilateral primary osteoarthritis, right knee: Secondary | ICD-10-CM | POA: Diagnosis not present

## 2024-02-21 DIAGNOSIS — H353221 Exudative age-related macular degeneration, left eye, with active choroidal neovascularization: Secondary | ICD-10-CM | POA: Diagnosis not present

## 2024-02-28 DIAGNOSIS — Z961 Presence of intraocular lens: Secondary | ICD-10-CM | POA: Diagnosis not present

## 2024-02-28 DIAGNOSIS — H353132 Nonexudative age-related macular degeneration, bilateral, intermediate dry stage: Secondary | ICD-10-CM | POA: Diagnosis not present

## 2024-02-28 DIAGNOSIS — H353221 Exudative age-related macular degeneration, left eye, with active choroidal neovascularization: Secondary | ICD-10-CM | POA: Diagnosis not present

## 2024-02-28 DIAGNOSIS — H353211 Exudative age-related macular degeneration, right eye, with active choroidal neovascularization: Secondary | ICD-10-CM | POA: Diagnosis not present

## 2024-02-28 DIAGNOSIS — H401131 Primary open-angle glaucoma, bilateral, mild stage: Secondary | ICD-10-CM | POA: Diagnosis not present

## 2024-02-28 DIAGNOSIS — H353233 Exudative age-related macular degeneration, bilateral, with inactive scar: Secondary | ICD-10-CM | POA: Diagnosis not present

## 2024-03-05 DIAGNOSIS — H353211 Exudative age-related macular degeneration, right eye, with active choroidal neovascularization: Secondary | ICD-10-CM | POA: Diagnosis not present

## 2024-03-18 DIAGNOSIS — I10 Essential (primary) hypertension: Secondary | ICD-10-CM | POA: Diagnosis not present

## 2024-03-18 DIAGNOSIS — Z79899 Other long term (current) drug therapy: Secondary | ICD-10-CM | POA: Diagnosis not present

## 2024-03-18 DIAGNOSIS — K219 Gastro-esophageal reflux disease without esophagitis: Secondary | ICD-10-CM | POA: Diagnosis not present

## 2024-03-18 DIAGNOSIS — M79605 Pain in left leg: Secondary | ICD-10-CM | POA: Diagnosis not present

## 2024-03-18 DIAGNOSIS — M79604 Pain in right leg: Secondary | ICD-10-CM | POA: Diagnosis not present

## 2024-03-18 DIAGNOSIS — R7303 Prediabetes: Secondary | ICD-10-CM | POA: Diagnosis not present

## 2024-03-18 DIAGNOSIS — E785 Hyperlipidemia, unspecified: Secondary | ICD-10-CM | POA: Diagnosis not present

## 2024-03-18 DIAGNOSIS — I480 Paroxysmal atrial fibrillation: Secondary | ICD-10-CM | POA: Diagnosis not present

## 2024-04-15 DIAGNOSIS — M79604 Pain in right leg: Secondary | ICD-10-CM | POA: Diagnosis not present

## 2024-04-15 DIAGNOSIS — M79605 Pain in left leg: Secondary | ICD-10-CM | POA: Diagnosis not present

## 2024-04-22 DIAGNOSIS — H353221 Exudative age-related macular degeneration, left eye, with active choroidal neovascularization: Secondary | ICD-10-CM | POA: Diagnosis not present

## 2024-05-14 DIAGNOSIS — Z961 Presence of intraocular lens: Secondary | ICD-10-CM | POA: Diagnosis not present

## 2024-05-14 DIAGNOSIS — H353221 Exudative age-related macular degeneration, left eye, with active choroidal neovascularization: Secondary | ICD-10-CM | POA: Diagnosis not present

## 2024-05-14 DIAGNOSIS — H353231 Exudative age-related macular degeneration, bilateral, with active choroidal neovascularization: Secondary | ICD-10-CM | POA: Diagnosis not present

## 2024-05-14 DIAGNOSIS — H353211 Exudative age-related macular degeneration, right eye, with active choroidal neovascularization: Secondary | ICD-10-CM | POA: Diagnosis not present

## 2024-05-16 DIAGNOSIS — I2510 Atherosclerotic heart disease of native coronary artery without angina pectoris: Secondary | ICD-10-CM | POA: Diagnosis not present

## 2024-05-16 DIAGNOSIS — Z9889 Other specified postprocedural states: Secondary | ICD-10-CM | POA: Diagnosis not present

## 2024-05-16 DIAGNOSIS — R0609 Other forms of dyspnea: Secondary | ICD-10-CM | POA: Diagnosis not present

## 2024-05-16 DIAGNOSIS — I4891 Unspecified atrial fibrillation: Secondary | ICD-10-CM | POA: Diagnosis not present

## 2024-05-16 DIAGNOSIS — I700 Atherosclerosis of aorta: Secondary | ICD-10-CM | POA: Diagnosis not present

## 2024-05-16 DIAGNOSIS — I10 Essential (primary) hypertension: Secondary | ICD-10-CM | POA: Diagnosis not present

## 2024-05-16 DIAGNOSIS — E782 Mixed hyperlipidemia: Secondary | ICD-10-CM | POA: Diagnosis not present

## 2024-05-16 DIAGNOSIS — I471 Supraventricular tachycardia, unspecified: Secondary | ICD-10-CM | POA: Diagnosis not present

## 2024-05-16 DIAGNOSIS — R001 Bradycardia, unspecified: Secondary | ICD-10-CM | POA: Diagnosis not present

## 2024-06-03 DIAGNOSIS — J9611 Chronic respiratory failure with hypoxia: Secondary | ICD-10-CM | POA: Diagnosis not present

## 2024-06-03 DIAGNOSIS — I4891 Unspecified atrial fibrillation: Secondary | ICD-10-CM | POA: Diagnosis not present

## 2024-06-03 DIAGNOSIS — Z9981 Dependence on supplemental oxygen: Secondary | ICD-10-CM | POA: Diagnosis not present

## 2024-06-03 DIAGNOSIS — J84112 Idiopathic pulmonary fibrosis: Secondary | ICD-10-CM | POA: Diagnosis not present

## 2024-06-03 DIAGNOSIS — R0902 Hypoxemia: Secondary | ICD-10-CM | POA: Diagnosis not present

## 2024-06-17 DIAGNOSIS — H353221 Exudative age-related macular degeneration, left eye, with active choroidal neovascularization: Secondary | ICD-10-CM | POA: Diagnosis not present

## 2024-06-26 DIAGNOSIS — I10 Essential (primary) hypertension: Secondary | ICD-10-CM | POA: Diagnosis not present

## 2024-06-26 DIAGNOSIS — E785 Hyperlipidemia, unspecified: Secondary | ICD-10-CM | POA: Diagnosis not present

## 2024-06-26 DIAGNOSIS — K219 Gastro-esophageal reflux disease without esophagitis: Secondary | ICD-10-CM | POA: Diagnosis not present

## 2024-06-26 DIAGNOSIS — I4891 Unspecified atrial fibrillation: Secondary | ICD-10-CM | POA: Diagnosis not present

## 2024-06-26 DIAGNOSIS — R7303 Prediabetes: Secondary | ICD-10-CM | POA: Diagnosis not present

## 2024-06-26 DIAGNOSIS — D649 Anemia, unspecified: Secondary | ICD-10-CM | POA: Diagnosis not present

## 2024-06-26 DIAGNOSIS — Z0000 Encounter for general adult medical examination without abnormal findings: Secondary | ICD-10-CM | POA: Diagnosis not present

## 2024-06-26 DIAGNOSIS — Z79899 Other long term (current) drug therapy: Secondary | ICD-10-CM | POA: Diagnosis not present

## 2024-07-03 DIAGNOSIS — D649 Anemia, unspecified: Secondary | ICD-10-CM | POA: Diagnosis not present

## 2024-07-16 DIAGNOSIS — H353211 Exudative age-related macular degeneration, right eye, with active choroidal neovascularization: Secondary | ICD-10-CM | POA: Diagnosis not present

## 2024-07-18 DIAGNOSIS — M1990 Unspecified osteoarthritis, unspecified site: Secondary | ICD-10-CM | POA: Diagnosis not present

## 2024-07-18 DIAGNOSIS — Z961 Presence of intraocular lens: Secondary | ICD-10-CM | POA: Diagnosis not present

## 2024-07-18 DIAGNOSIS — D500 Iron deficiency anemia secondary to blood loss (chronic): Secondary | ICD-10-CM | POA: Diagnosis not present

## 2024-07-18 DIAGNOSIS — R519 Headache, unspecified: Secondary | ICD-10-CM | POA: Diagnosis not present

## 2024-07-18 DIAGNOSIS — H353231 Exudative age-related macular degeneration, bilateral, with active choroidal neovascularization: Secondary | ICD-10-CM | POA: Diagnosis not present

## 2024-07-18 DIAGNOSIS — H401131 Primary open-angle glaucoma, bilateral, mild stage: Secondary | ICD-10-CM | POA: Diagnosis not present

## 2024-07-18 DIAGNOSIS — J9611 Chronic respiratory failure with hypoxia: Secondary | ICD-10-CM | POA: Diagnosis not present

## 2024-07-18 DIAGNOSIS — R569 Unspecified convulsions: Secondary | ICD-10-CM | POA: Diagnosis not present

## 2024-07-18 DIAGNOSIS — H3530 Unspecified macular degeneration: Secondary | ICD-10-CM | POA: Diagnosis not present

## 2024-07-23 ENCOUNTER — Other Ambulatory Visit: Payer: Self-pay | Admitting: Neurology

## 2024-07-23 DIAGNOSIS — R569 Unspecified convulsions: Secondary | ICD-10-CM

## 2024-07-23 DIAGNOSIS — R519 Headache, unspecified: Secondary | ICD-10-CM

## 2024-07-31 ENCOUNTER — Ambulatory Visit
Admission: RE | Admit: 2024-07-31 | Discharge: 2024-07-31 | Disposition: A | Discharge status: Home / Self Care | Source: Ambulatory Visit | Attending: Neurology | Admitting: Neurology

## 2024-07-31 DIAGNOSIS — R519 Headache, unspecified: Secondary | ICD-10-CM | POA: Insufficient documentation

## 2024-07-31 DIAGNOSIS — G8929 Other chronic pain: Secondary | ICD-10-CM | POA: Diagnosis not present

## 2024-07-31 DIAGNOSIS — R569 Unspecified convulsions: Secondary | ICD-10-CM | POA: Insufficient documentation

## 2024-08-08 DIAGNOSIS — M1711 Unilateral primary osteoarthritis, right knee: Secondary | ICD-10-CM | POA: Diagnosis not present

## 2024-08-08 DIAGNOSIS — Z7901 Long term (current) use of anticoagulants: Secondary | ICD-10-CM | POA: Diagnosis not present

## 2024-08-12 DIAGNOSIS — H353221 Exudative age-related macular degeneration, left eye, with active choroidal neovascularization: Secondary | ICD-10-CM | POA: Diagnosis not present

## 2024-08-16 ENCOUNTER — Inpatient Hospital Stay: Payer: Medicare HMO | Attending: Oncology

## 2024-08-16 DIAGNOSIS — M549 Dorsalgia, unspecified: Secondary | ICD-10-CM | POA: Diagnosis not present

## 2024-08-16 DIAGNOSIS — Z79899 Other long term (current) drug therapy: Secondary | ICD-10-CM | POA: Insufficient documentation

## 2024-08-16 DIAGNOSIS — Z7951 Long term (current) use of inhaled steroids: Secondary | ICD-10-CM | POA: Insufficient documentation

## 2024-08-16 DIAGNOSIS — Z7901 Long term (current) use of anticoagulants: Secondary | ICD-10-CM | POA: Insufficient documentation

## 2024-08-16 DIAGNOSIS — G629 Polyneuropathy, unspecified: Secondary | ICD-10-CM | POA: Insufficient documentation

## 2024-08-16 DIAGNOSIS — M898X9 Other specified disorders of bone, unspecified site: Secondary | ICD-10-CM | POA: Insufficient documentation

## 2024-08-16 DIAGNOSIS — D472 Monoclonal gammopathy: Secondary | ICD-10-CM | POA: Diagnosis not present

## 2024-08-16 DIAGNOSIS — G8929 Other chronic pain: Secondary | ICD-10-CM | POA: Insufficient documentation

## 2024-08-16 LAB — CBC WITH DIFFERENTIAL (CANCER CENTER ONLY)
Abs Immature Granulocytes: 0.04 K/uL (ref 0.00–0.07)
Basophils Absolute: 0.1 K/uL (ref 0.0–0.1)
Basophils Relative: 1 %
Eosinophils Absolute: 0.1 K/uL (ref 0.0–0.5)
Eosinophils Relative: 2 %
HCT: 33.7 % — ABNORMAL LOW (ref 36.0–46.0)
Hemoglobin: 11 g/dL — ABNORMAL LOW (ref 12.0–15.0)
Immature Granulocytes: 1 %
Lymphocytes Relative: 19 %
Lymphs Abs: 1.5 K/uL (ref 0.7–4.0)
MCH: 32.2 pg (ref 26.0–34.0)
MCHC: 32.6 g/dL (ref 30.0–36.0)
MCV: 98.5 fL (ref 80.0–100.0)
Monocytes Absolute: 0.9 K/uL (ref 0.1–1.0)
Monocytes Relative: 11 %
Neutro Abs: 5.5 K/uL (ref 1.7–7.7)
Neutrophils Relative %: 66 %
Platelet Count: 280 K/uL (ref 150–400)
RBC: 3.42 MIL/uL — ABNORMAL LOW (ref 3.87–5.11)
RDW: 12.9 % (ref 11.5–15.5)
WBC Count: 8.1 K/uL (ref 4.0–10.5)
nRBC: 0 % (ref 0.0–0.2)

## 2024-08-16 LAB — CMP (CANCER CENTER ONLY)
ALT: 18 U/L (ref 0–44)
AST: 26 U/L (ref 15–41)
Albumin: 4.2 g/dL (ref 3.5–5.0)
Alkaline Phosphatase: 77 U/L (ref 38–126)
Anion gap: 9 (ref 5–15)
BUN: 16 mg/dL (ref 8–23)
CO2: 26 mmol/L (ref 22–32)
Calcium: 8.4 mg/dL — ABNORMAL LOW (ref 8.9–10.3)
Chloride: 104 mmol/L (ref 98–111)
Creatinine: 0.63 mg/dL (ref 0.44–1.00)
GFR, Estimated: >60 mL/min (ref >60)
Glucose, Bld: 102 mg/dL — ABNORMAL HIGH (ref 70–99)
Potassium: 4.7 mmol/L (ref 3.5–5.1)
Sodium: 139 mmol/L (ref 135–145)
Total Bilirubin: 0.6 mg/dL (ref 0.0–1.2)
Total Protein: 6.3 g/dL — ABNORMAL LOW (ref 6.5–8.1)

## 2024-08-19 LAB — KAPPA/LAMBDA LIGHT CHAINS
Kappa free light chain: 41.4 mg/L — ABNORMAL HIGH (ref 3.3–19.4)
Kappa, lambda light chain ratio: 3.37 — ABNORMAL HIGH (ref 0.26–1.65)
Lambda free light chains: 12.3 mg/L (ref 5.7–26.3)

## 2024-08-20 ENCOUNTER — Other Ambulatory Visit: Payer: Self-pay | Admitting: Oncology

## 2024-08-20 ENCOUNTER — Other Ambulatory Visit: Payer: Self-pay

## 2024-08-20 DIAGNOSIS — D472 Monoclonal gammopathy: Secondary | ICD-10-CM | POA: Diagnosis not present

## 2024-08-20 DIAGNOSIS — M898X9 Other specified disorders of bone, unspecified site: Secondary | ICD-10-CM | POA: Diagnosis not present

## 2024-08-20 LAB — MULTIPLE MYELOMA PANEL, SERUM
Albumin SerPl Elph-Mcnc: 3.7 g/dL (ref 2.9–4.4)
Albumin/Glob SerPl: 1.7 (ref 0.7–1.7)
Alpha 1: 0.2 g/dL (ref 0.0–0.4)
Alpha2 Glob SerPl Elph-Mcnc: 0.8 g/dL (ref 0.4–1.0)
B-Globulin SerPl Elph-Mcnc: 0.8 g/dL (ref 0.7–1.3)
Gamma Glob SerPl Elph-Mcnc: 0.5 g/dL (ref 0.4–1.8)
Globulin, Total: 2.3 g/dL (ref 2.2–3.9)
IgA: 102 mg/dL (ref 64–422)
IgG (Immunoglobin G), Serum: 630 mg/dL (ref 586–1602)
IgM (Immunoglobulin M), Srm: 55 mg/dL (ref 26–217)
M Protein SerPl Elph-Mcnc: 0.2 g/dL — ABNORMAL HIGH
Total Protein ELP: 6 g/dL (ref 6.0–8.5)

## 2024-08-22 DIAGNOSIS — B351 Tinea unguium: Secondary | ICD-10-CM | POA: Diagnosis not present

## 2024-08-22 DIAGNOSIS — M19072 Primary osteoarthritis, left ankle and foot: Secondary | ICD-10-CM | POA: Diagnosis not present

## 2024-08-22 DIAGNOSIS — M79674 Pain in right toe(s): Secondary | ICD-10-CM | POA: Diagnosis not present

## 2024-08-22 DIAGNOSIS — M2012 Hallux valgus (acquired), left foot: Secondary | ICD-10-CM | POA: Diagnosis not present

## 2024-08-22 DIAGNOSIS — M2011 Hallux valgus (acquired), right foot: Secondary | ICD-10-CM | POA: Diagnosis not present

## 2024-08-22 DIAGNOSIS — M19071 Primary osteoarthritis, right ankle and foot: Secondary | ICD-10-CM | POA: Diagnosis not present

## 2024-08-22 DIAGNOSIS — M79675 Pain in left toe(s): Secondary | ICD-10-CM | POA: Diagnosis not present

## 2024-08-23 LAB — IFE+PROTEIN ELECTRO, 24-HR UR
% BETA, Urine: 0 %
ALPHA 1 URINE: 0 %
Albumin, U: 100 %
Alpha 2, Urine: 0 %
GAMMA GLOBULIN URINE: 0 %
Total Protein, Urine-Ur/day: 92 mg/(24.h) (ref 30–150)
Total Protein, Urine: 4.4 mg/dL
Total Volume: 2100

## 2024-08-26 ENCOUNTER — Encounter: Payer: Self-pay | Admitting: Oncology

## 2024-08-26 ENCOUNTER — Inpatient Hospital Stay: Payer: Medicare HMO | Admitting: Oncology

## 2024-08-26 VITALS — BP 114/61 | HR 64 | Temp 97.8°F | Resp 16 | Wt 120.0 lb

## 2024-08-26 DIAGNOSIS — G629 Polyneuropathy, unspecified: Secondary | ICD-10-CM | POA: Diagnosis not present

## 2024-08-26 DIAGNOSIS — M898X9 Other specified disorders of bone, unspecified site: Secondary | ICD-10-CM | POA: Diagnosis not present

## 2024-08-26 DIAGNOSIS — D472 Monoclonal gammopathy: Secondary | ICD-10-CM | POA: Diagnosis not present

## 2024-08-26 DIAGNOSIS — M549 Dorsalgia, unspecified: Secondary | ICD-10-CM | POA: Diagnosis not present

## 2024-08-26 DIAGNOSIS — Z7901 Long term (current) use of anticoagulants: Secondary | ICD-10-CM | POA: Diagnosis not present

## 2024-08-26 DIAGNOSIS — Z7951 Long term (current) use of inhaled steroids: Secondary | ICD-10-CM | POA: Diagnosis not present

## 2024-08-26 DIAGNOSIS — Z79899 Other long term (current) drug therapy: Secondary | ICD-10-CM | POA: Diagnosis not present

## 2024-08-26 DIAGNOSIS — G8929 Other chronic pain: Secondary | ICD-10-CM | POA: Diagnosis not present

## 2024-08-26 NOTE — Progress Notes (Signed)
 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  Light chain ratio is slightly increased. Lab Results  Component Value Date   MPROTEIN 0.2 (H) 08/16/2024   KPAFRELGTCHN 41.4 (H) 08/16/2024   LAMBDASER 12.3 08/16/2024   KAPLAMBRATIO 3.37 (H) 08/16/2024    Stable M protein, and light chain ratio.  Patient has stable calcium  level, normal kidney function.  Stable hemoglobin level.   I recommend continue observation and patient will follow-up with me in 1 year.  Orders Placed This Encounter  Procedures   CBC with Differential (Cancer Center Only)    Standing Status:   Future    Expected Date:   08/26/2025    Expiration Date:   11/24/2025   Kappa/lambda light chains    Standing Status:   Future    Expected Date:   08/26/2025    Expiration Date:   11/24/2025   Multiple Myeloma Panel (SPEP&IFE w/QIG)    Standing Status:   Future    Expected Date:   08/26/2025    Expiration Date:   11/24/2025   Follow-up in 1 year All questions were answered. The patient knows to call the clinic with any problems, questions or concerns.  Call Babara, MD, PhD [facility] Hematology Oncology 08/26/2024    CHIEF COMPLAINTS/REASON FOR VISIT:  Follow up for MGUS  HISTORY OF PRESENTING ILLNESS:  Mary Thornton is a 83 y.o. female presents for MGUS  Patient recently had work up done at Neurologist's office for lower extremity neuropathy.. Labs reviewed,  03/18/2019 SPEP showed 0.2 M spike, repeat SPEP on 06/27/2019 showed M protein of 0.2. IFE showed IgG Kappa monoclonal protein.  No aggravating or alleviated factors.  Associated signs or symptoms:  Neuropathy: Cool sensation in her face since around June 2019.  Symptoms have  been gradually worsening.  She is not diabetic.  Also numbness in her bilateral feet since around October 2019 after lumbar decompression surgery.  Symptoms are worse when she lays down. Denies weight loss, fever chills, night sweating   Bone pain: Chronic back pain. 07/08/2022 - 07/13/2022, patient was hospitalized due to atrial fibrillation with rapid ventricular response.  Patient is now on Eliquis  for anticoagulation  INTERVAL HISTORY Mary Thornton is a 83 y.o. female who has above history reviewed by me today presents for follow up visit for management of MGUS Problems and complaints are listed below: Denies any new bone pain.  Chronic back pain unchanged.   Review of Systems  Constitutional:  Negative for appetite change, chills, fatigue and fever.  HENT:   Negative for hearing loss and voice change.   Eyes:  Negative for eye problems.  Respiratory:  Negative for chest tightness and cough.   Cardiovascular:  Negative for chest pain.       A-fib  Gastrointestinal:  Negative for abdominal distention, abdominal pain and blood in stool.  Endocrine: Negative for hot flashes.  Genitourinary:  Negative for difficulty urinating and frequency.   Musculoskeletal:  Negative for arthralgias and back pain.  Skin:  Negative for itching and rash.  Neurological:  Positive for numbness. Negative for extremity weakness.  Hematological:  Negative for adenopathy.  Psychiatric/Behavioral:  Negative for confusion.      MEDICAL HISTORY:  Past Medical History:  Diagnosis  Date   Arthritis    Asthma    Barrett esophagus    Complication of anesthesia    very hard to wake up. feels like she had too much medicine. 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 Cleatus   CATARACT EXTRACTION W/PHACO Left 07/24/2023   Procedure: CATARACT EXTRACTION PHACO AND INTRAOCULAR LENS PLACEMENT (IOC) LEFT HYDRUS MICROSTENT  6.84  00:38.6;  Surgeon: Myrna Adine Anes, MD;  Location: [facility];  Service: Ophthalmology;  Laterality: Left;   COLONOSCOPY WITH PROPOFOL  N/A 06/23/2015   Procedure: COLONOSCOPY WITH PROPOFOL ;  Surgeon: Gladis RAYMOND Mariner, MD;  Location: [facility] ENDOSCOPY;  Service: Endoscopy;  Laterality: N/A;   COLONOSCOPY WITH PROPOFOL  N/A 08/11/2020   Procedure: COLONOSCOPY WITH PROPOFOL ;  Surgeon: Maryruth Ole DASEN, MD;  Location: [facility] ENDOSCOPY;  Service: Endoscopy;  Laterality: N/A;   ESOPHAGOGASTRODUODENOSCOPY (EGD) WITH PROPOFOL  N/A 06/23/2015   Procedure: ESOPHAGOGASTRODUODENOSCOPY (EGD) WITH PROPOFOL ;  Surgeon: Gladis RAYMOND Mariner, MD;  Location: [facility] ENDOSCOPY;  Service: Endoscopy;  Laterality: N/A;   ESOPHAGOGASTRODUODENOSCOPY (EGD) WITH PROPOFOL  N/A 11/28/2017   Procedure: ESOPHAGOGASTRODUODENOSCOPY (EGD) WITH PROPOFOL ;  Surgeon: Mariner Gladis RAYMOND, MD;  Location: [facility] ENDOSCOPY;  Service: Endoscopy;  Laterality: N/A;   ESOPHAGOGASTRODUODENOSCOPY (EGD) WITH PROPOFOL  N/A 01/18/2018   Procedure: ESOPHAGOGASTRODUODENOSCOPY (EGD) WITH PROPOFOL ;  Surgeon: Mariner Gladis RAYMOND, MD;  Location: [facility] ENDOSCOPY;  Service: Endoscopy;  Laterality: N/A;   ESOPHAGOGASTRODUODENOSCOPY (EGD) WITH  PROPOFOL  N/A 08/11/2020   Procedure: ESOPHAGOGASTRODUODENOSCOPY (EGD) WITH PROPOFOL ;  Surgeon: Maryruth Ole DASEN, MD;  Location: [facility] ENDOSCOPY;  Service: Endoscopy;  Laterality: N/A;   JOINT REPLACEMENT Left 2009   total knee replacement   KNEE ARTHROSCOPY     LUMBAR LAMINECTOMY/DECOMPRESSION MICRODISCECTOMY N/A 08/22/2018   Procedure: LUMBAR LAMINECTOMY/DECOMPRESSION MICRODISCECTOMY 1 LEVEL-L2-5;  Surgeon: Clois Fret, MD;  Location: [facility] ORS;  Service: Neurosurgery;  Laterality: N/A;   RIGHT/LEFT HEART CATH AND CORONARY ANGIOGRAPHY Bilateral 11/02/2023   Procedure: RIGHT/LEFT HEART CATH AND CORONARY ANGIOGRAPHY;  Surgeon: Florencio Cara BIRCH, MD;  Location: [facility] INVASIVE CV LAB;  Service: Cardiovascular;  Laterality: Bilateral;   Torticollis surgery x 2      SOCIAL HISTORY: Social History   Socioeconomic History   Marital status: Widowed    Spouse name: Not on file   Number of children: Not on file   Years of education: Not on file   Highest education level: Not on file  Occupational History   Occupation: [employer]    Comment: retired  Tobacco Use   Smoking status: Never   Smokeless tobacco: Never  Vaping  Use   Vaping status: Never Used  Substance and Sexual Activity   Alcohol use: Never   Drug use: Never   Sexual activity: Not Currently    Birth control/protection: Surgical  Other Topics Concern   Not on file  Social History Narrative   Not on file   Social Drivers of Health   Financial Resource Strain: Low Risk  (08/22/2024)   Received from [facility]   Overall Financial Resource Strain (CARDIA)    Difficulty of Paying Living Expenses: Not very hard  Food Insecurity: No Food Insecurity (08/22/2024)   Received from [facility]   Hunger Vital Sign    Within the past 12 months, you worried that your food would run out before you got the money to buy more.: Never true    Within the past 12 months, the food you bought  just didn't last and you didn't have money to get more.: Never true  Transportation Needs: No Transportation Needs (08/22/2024)   Received from [facility] - Transportation    In the past 12 months, has lack of transportation kept you from medical appointments or from getting medications?: No    Lack of Transportation (Non-Medical): No  Physical Activity: Not on file  Stress: Not on file  Social Connections: Not on file  Intimate Partner Violence: Not At Risk (03/09/2023)   Humiliation, Afraid, Rape, and Kick questionnaire    Fear of Current or Ex-Partner: No    Emotionally Abused: No    Physically Abused: No    Sexually Abused: No    FAMILY HISTORY: Family History  Problem Relation Age of Onset   Heart disease Mother    Heart disease Sister    Cancer Neg Hx    Diabetes Neg Hx    Breast cancer Neg Hx    Amblyopia Neg Hx    Blindness Neg Hx    Cataracts Neg Hx    Glaucoma Neg Hx    Macular degeneration Neg Hx    Retinal detachment Neg Hx    Strabismus Neg Hx    Retinitis pigmentosa Neg Hx     ALLERGIES:  is allergic to ibuprofen, chlorhexidine , cymbalta [duloxetine hcl], penicillins, and sulfa  antibiotics.  MEDICATIONS:  Current Outpatient Medications  Medication Sig Dispense Refill   acetaminophen  (TYLENOL ) 325 MG tablet Take 650 mg by mouth every 6 (six) hours as needed.     apixaban  (ELIQUIS ) 2.5 MG TABS tablet Take 2.5 mg by mouth 2 (two) times daily.     atorvastatin  (LIPITOR) 40 MG tablet Take 1 tablet (40 mg total) by mouth daily. 30 tablet 0   Calcium  Carbonate-Vitamin D 600-10 MG-MCG TABS Take 1 tablet by mouth daily.     clorazepate  (TRANXENE ) 3.75 MG tablet Take 3.75 mg by mouth at bedtime.     diltiazem  (CARDIZEM ) 60 MG tablet Take 60 mg by mouth 2 (two) times daily.     feeding supplement (ENSURE ENLIVE / ENSURE PLUS) LIQD Take 237 mLs by mouth 2 (two) times daily between meals. 237 mL 12   ferrous sulfate 325 (65 FE) MG tablet Take  325 mg by mouth daily with breakfast.     fluticasone-salmeterol (ADVAIR) 100-50 MCG/ACT AEPB Inhale 1 puff into the lungs.     furosemide (LASIX) 20 MG tablet Take 20 mg by mouth in the morning.     levETIRAcetam  (KEPPRA ) 250 MG tablet Take 250 mg by mouth.     loratadine  (CLARITIN )  10 MG tablet Take 1 tablet (10 mg total) by mouth daily. 30 tablet 0   losartan  (COZAAR ) 25 MG tablet Take 2 tablets (50 mg total) by mouth 2 (two) times daily. 30 tablet 1   metoprolol  tartrate (LOPRESSOR ) 25 MG tablet TAKE 0.5 TABLETS (12.5 MG TOTAL) BY MOUTH 3 (THREE) TIMES DAILY AS NEEDED     montelukast  (SINGULAIR ) 10 MG tablet Take 10 mg by mouth at bedtime.     Multiple Vitamin (MULTIVITAMIN) tablet Take 1 tablet by mouth daily at 3 pm.      pantoprazole  (PROTONIX ) 40 MG tablet Take 40 mg by mouth 2 (two) times daily.     torsemide (DEMADEX) 20 MG tablet Take 20 mg by mouth daily.     Multiple Vitamins-Minerals (PRESERVISION AREDS PO) Take 1 capsule by mouth 2 (two) times daily. Take one capsule in the morning and one in the evening (Patient not taking: Reported on 08/26/2024)     No current facility-administered medications for this visit.     PHYSICAL EXAMINATION: ECOG PERFORMANCE STATUS: 1 - Symptomatic but completely ambulatory Vitals:   08/26/24 1006  BP: 114/61  Pulse: 64  Resp: 16  Temp: 97.8 F (36.6 C)  SpO2: 96%   Filed Weights   08/26/24 1006  Weight: 120 lb (54.4 kg)    Physical Exam Constitutional:      General: She is not in acute distress. HENT:     Head: Normocephalic and atraumatic.  Eyes:     General: No scleral icterus. Cardiovascular:     Rate and Rhythm: Normal rate and regular rhythm.     Heart sounds: Normal heart sounds.  Pulmonary:     Effort: Pulmonary effort is normal. No respiratory distress.     Breath sounds: No wheezing.  Abdominal:     General: Bowel sounds are normal. There is no distension.     Palpations: Abdomen is soft.  Musculoskeletal:         General: No deformity. Normal range of motion.     Cervical back: Normal range of motion and neck supple.  Skin:    General: Skin is warm and dry.     Findings: No erythema or rash.  Neurological:     Mental Status: She is alert and oriented to person, place, and time. Mental status is at baseline.     Cranial Nerves: No cranial nerve deficit.  Psychiatric:        Mood and Affect: Mood normal.      LABORATORY DATA:  I have reviewed the data as listed    Latest Ref Rng & Units 08/16/2024    9:58 AM 11/02/2023   11:04 AM 11/02/2023   11:01 AM  CBC  WBC 4.0 - 10.5 K/uL 8.1     Hemoglobin 12.0 - 15.0 g/dL 88.9  89.0  8.8   Hematocrit 36.0 - 46.0 % 33.7  32.0  26.0   Platelets 150 - 400 K/uL 280         Latest Ref Rng & Units 08/16/2024    9:58 AM 11/02/2023   11:04 AM 11/02/2023   11:01 AM  CMP  Glucose 70 - 99 mg/dL 897     BUN 8 - 23 mg/dL 16     Creatinine 9.55 - 1.00 mg/dL 9.36     Sodium 864 - 854 mmol/L 139  143  145   Potassium 3.5 - 5.1 mmol/L 4.7  3.7  2.8   Chloride 98 - 111 mmol/L 104  CO2 22 - 32 mmol/L 26     Calcium  8.9 - 10.3 mg/dL 8.4     Total Protein 6.5 - 8.1 g/dL 6.3     Total Bilirubin 0.0 - 1.2 mg/dL 0.6     Alkaline Phos 38 - 126 U/L 77     AST 15 - 41 U/L 26     ALT 0 - 44 U/L 18        Iron/TIBC/Ferritin/ %Sat No results found for: IRON, TIBC, FERRITIN, IRONPCTSAT    RADIOGRAPHIC STUDIES: I have personally reviewed the radiological images as listed and agreed with the findings in the report.  CT HEAD WO CONTRAST ( ) Result Date: 08/01/2024 EXAM: CT HEAD WITHOUT CONTRAST 07/31/2024 11:06:32 AM TECHNIQUE: CT of the head was performed without the administration of intravenous contrast. Automated exposure control, iterative reconstruction, and/or weight based adjustment of the mA/kV was utilized to reduce the radiation dose to as low as reasonably achievable. COMPARISON: Brain MRI 07/10/2022 and head CT 07/08/22. CLINICAL HISTORY: 83 year old  female with seizure-like activity (HCC) and chronic daily headache. FINDINGS: BRAIN AND VENTRICLES: No acute hemorrhage. No evidence of acute infarct. No hydrocephalus. No extra-axial collection. No mass effect or midline shift. Stable cerebral volume since 2023, normal for age. Chronic white matter disease including deep white matter capsule involvement and associated chronic heterogeneity in the deep gray nuclei, moderate for age. Faint chronic basal ganglia vascular calcifications. Calcified atherosclerosis at the skull base. No suspicious intracranial vascular hyperdensity. ORBITS: Chronic postoperative changes to both globes. SINUSES: No acute abnormality. SOFT TISSUES AND SKULL: No acute soft tissue abnormality. No skull fracture. No acute scalp soft tissue finding. Previous suboccipital decompression. TMJ degeneration. IMPRESSION: 1. No acute intracranial abnormality. 2. Stable brain since 12/2021. Electronically signed by: Helayne Hurst MD 08/01/2024 10:46 AM EDT RP Workstation: [id number]

## 2024-08-26 NOTE — Assessment & Plan Note (Addendum)
 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  Light chain ratio is slightly increased. Lab Results  Component Value Date   MPROTEIN 0.2 (H) 08/16/2024   KPAFRELGTCHN 41.4 (H) 08/16/2024   LAMBDASER 12.3 08/16/2024   KAPLAMBRATIO 3.37 (H) 08/16/2024    Stable M protein, and light chain ratio.  Patient has stable calcium  level, normal kidney function.  Stable hemoglobin level.   I recommend continue observation and patient will follow-up with me in 1 year.

## 2024-09-12 ENCOUNTER — Other Ambulatory Visit: Payer: Self-pay | Admitting: Internal Medicine

## 2024-09-12 DIAGNOSIS — Z1231 Encounter for screening mammogram for malignant neoplasm of breast: Secondary | ICD-10-CM

## 2024-09-23 DIAGNOSIS — E782 Mixed hyperlipidemia: Secondary | ICD-10-CM | POA: Diagnosis not present

## 2024-09-23 DIAGNOSIS — I4891 Unspecified atrial fibrillation: Secondary | ICD-10-CM | POA: Diagnosis not present

## 2024-09-23 DIAGNOSIS — K219 Gastro-esophageal reflux disease without esophagitis: Secondary | ICD-10-CM | POA: Diagnosis not present

## 2024-09-23 DIAGNOSIS — I10 Essential (primary) hypertension: Secondary | ICD-10-CM | POA: Diagnosis not present

## 2024-09-23 DIAGNOSIS — R7303 Prediabetes: Secondary | ICD-10-CM | POA: Diagnosis not present

## 2024-10-18 ENCOUNTER — Ambulatory Visit
Admission: RE | Admit: 2024-10-18 | Discharge: 2024-10-18 | Disposition: A | Discharge status: Home / Self Care | Source: Ambulatory Visit | Attending: Internal Medicine | Admitting: Internal Medicine

## 2024-10-18 DIAGNOSIS — Z1231 Encounter for screening mammogram for malignant neoplasm of breast: Secondary | ICD-10-CM | POA: Insufficient documentation

## 2025-08-18 ENCOUNTER — Inpatient Hospital Stay

## 2025-08-26 ENCOUNTER — Inpatient Hospital Stay: Admitting: Oncology
